# Patient Record
Sex: Female | Born: 1995 | Race: White | Hispanic: No | Marital: Single | Smoking: Former smoker
Health system: Southern US, Community
[De-identification: ages and names within clinical notes are randomized; demographics above are authoritative.]

## PROBLEM LIST (undated history)

## (undated) ENCOUNTER — Inpatient Hospital Stay: Payer: Self-pay

## (undated) DIAGNOSIS — F419 Anxiety disorder, unspecified: Secondary | ICD-10-CM

## (undated) DIAGNOSIS — Z789 Other specified health status: Secondary | ICD-10-CM

## (undated) DIAGNOSIS — F53 Postpartum depression: Secondary | ICD-10-CM

## (undated) DIAGNOSIS — M549 Dorsalgia, unspecified: Secondary | ICD-10-CM

## (undated) HISTORY — PX: NO PAST SURGERIES: SHX2092

## (undated) HISTORY — PX: WISDOM TOOTH EXTRACTION: SHX21

---
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
## 2014-05-28 ENCOUNTER — Encounter: Payer: Self-pay | Admitting: Emergency Medicine

## 2014-05-28 ENCOUNTER — Emergency Department
Admission: EM | Admit: 2014-05-28 | Discharge: 2014-05-28 | Disposition: A | Discharge status: Home / Self Care | Payer: 59 | Attending: Emergency Medicine | Admitting: Emergency Medicine

## 2014-05-28 DIAGNOSIS — Z720 Tobacco use: Secondary | ICD-10-CM | POA: Insufficient documentation

## 2014-05-28 DIAGNOSIS — Z792 Long term (current) use of antibiotics: Secondary | ICD-10-CM | POA: Diagnosis not present

## 2014-05-28 DIAGNOSIS — M549 Dorsalgia, unspecified: Secondary | ICD-10-CM | POA: Insufficient documentation

## 2014-05-28 DIAGNOSIS — M542 Cervicalgia: Secondary | ICD-10-CM | POA: Insufficient documentation

## 2014-05-28 MED ORDER — CYCLOBENZAPRINE HCL 10 MG PO TABS
10.0000 mg | ORAL_TABLET | Freq: Once | ORAL | Status: AC
  Administered 2014-05-28: 10 mg via ORAL

## 2014-05-28 MED ORDER — CYCLOBENZAPRINE HCL 10 MG PO TABS: 10.0000 mg | ORAL_TABLET | Freq: Three times a day (TID) | ORAL | Status: DC | PRN

## 2014-05-28 MED ORDER — TRAMADOL HCL 50 MG PO TABS
50.0000 mg | ORAL_TABLET | Freq: Once | ORAL | Status: AC
Start: 2014-05-28 — End: 2014-05-28
  Administered 2014-05-28: 50 mg via ORAL

## 2014-05-28 MED ORDER — TRAMADOL HCL 50 MG PO TABS
ORAL_TABLET | ORAL | Status: AC
  Administered 2014-05-28: 50 mg via ORAL
  Filled 2014-05-28: qty 1

## 2014-05-28 MED ORDER — CYCLOBENZAPRINE HCL 10 MG PO TABS
ORAL_TABLET | ORAL | Status: AC
  Administered 2014-05-28: 10 mg via ORAL
  Filled 2014-05-28: qty 1

## 2014-05-28 NOTE — ED Notes (Signed)
Pt in with co back pain x 1 week, states worse on movement and bending.  States does a lot of heavy lifting at work but no known injury.

## 2014-05-28 NOTE — ED Notes (Signed)
Patient with no complaints at this time. Respirations even and unlabored. Skin warm/dry. Discharge instructions reviewed with patient at this time. Patient given opportunity to voice concerns/ask questions. Patient discharged at this time and left Emergency Department with steady gait.   

## 2014-05-28 NOTE — ED Notes (Signed)
Pt is unsure of trauma/injury to area. Pt states she has tried OTC medications, without any relief. Pt states she has had these sx in the past, relief with time. Pt is alert and oriented x4. No physical deformities noted upon physical assessment. No bruising, bleeding, or swelling noted.
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
## 2014-05-28 NOTE — Discharge Instructions (Signed)
Back Pain, Adult °Low back pain is very common. About 1 in 5 people have back pain. The cause of low back pain is rarely dangerous. The pain often gets better over time. About half of people with a sudden onset of back pain feel better in just 2 weeks. About 8 in 10 people feel better by 6 weeks.  °CAUSES °Some common causes of back pain include: °· Strain of the muscles or ligaments supporting the spine. °· Wear and tear (degeneration) of the spinal discs. °· Arthritis. °· Direct injury to the back. °DIAGNOSIS °Most of the time, the direct cause of low back pain is not known. However, back pain can be treated effectively even when the exact cause of the pain is unknown. Answering your caregiver's questions about your overall health and symptoms is one of the most accurate ways to make sure the cause of your pain is not dangerous. If your caregiver needs more information, he or she may order lab work or imaging tests (X-rays or MRIs). However, even if imaging tests show changes in your back, this usually does not require surgery. °HOME CARE INSTRUCTIONS °For many people, back pain returns. Since low back pain is rarely dangerous, it is often a condition that people can learn to manage on their own.  °· Remain active. It is stressful on the back to sit or stand in one place. Do not sit, drive, or stand in one place for more than 30 minutes at a time. Take short walks on level surfaces as soon as pain allows. Try to increase the length of time you walk each day. °· Do not stay in bed. Resting more than 1 or 2 days can delay your recovery. °· Do not avoid exercise or work. Your body is made to move. It is not dangerous to be active, even though your back may hurt. Your back will likely heal faster if you return to being active before your pain is gone. °· Pay attention to your body when you  bend and lift. Many people have less discomfort when lifting if they bend their knees, keep the load close to their bodies, and  avoid twisting. Often, the most comfortable positions are those that put less stress on your recovering back. °· Find a comfortable position to sleep. Use a firm mattress and lie on your side with your knees slightly bent. If you lie on your back, put a pillow under your knees. °· Only take over-the-counter or prescription medicines as directed by your caregiver. Over-the-counter medicines to reduce pain and inflammation are often the most helpful. Your caregiver may prescribe muscle relaxant drugs. These medicines help dull your pain so you can more quickly return to your normal activities and healthy exercise. °· Put ice on the injured area. °· Put ice in a plastic bag. °· Place a towel between your skin and the bag. °· Leave the ice on for 15-20 minutes, 03-04 times a day for the first 2 to 3 days. After that, ice and heat may be alternated to reduce pain and spasms. °· Ask your caregiver about trying back exercises and gentle massage. This may be of some benefit. °· Avoid feeling anxious or stressed. Stress increases muscle tension and can worsen back pain. It is important to recognize when you are anxious or stressed and learn ways to manage it. Exercise is a great option. °SEEK MEDICAL CARE IF: °· You have pain that is not relieved with rest or medicine. °· You have pain that does not improve in 1 week. °· You have new symptoms. °· You are generally not feeling well. °SEEK   IMMEDIATE MEDICAL CARE IF:   You have pain that radiates from your back into your legs.  You develop new bowel or bladder control problems.  You have unusual weakness or numbness in your arms or legs.  You develop nausea or vomiting.  You develop abdominal pain.  You feel faint. Document Released: 12/28/2004 Document Revised: 06/29/2011 Document Reviewed: 05/01/2013 [facility] Patient Information 2015 [place], [place]. This information is not intended to replace advice given to you by your health care provider. Make sure you  discuss any questions you have with your health care provider.  Back Exercises Back exercises help treat and prevent back injuries. The goal of back exercises is to increase the strength of your abdominal and back muscles and the flexibility of your back. These exercises should be started when you no longer have back pain. Back exercises include:  Pelvic Tilt. Lie on your back with your knees bent. Tilt your pelvis until the lower part of your back is against the floor. Hold this position 5 to 10 sec and repeat 5 to 10 times.  Knee to Chest. Pull first 1 knee up against your chest and hold for 20 to 30 seconds, repeat this with the other knee, and then both knees. This may be done with the other leg straight or bent, whichever feels better.  Sit-Ups or Curl-Ups. Bend your knees 90 degrees. Start with tilting your pelvis, and do a partial, slow sit-up, lifting your trunk only 30 to 45 degrees off the floor. Take at least 2 to 3 seconds for each sit-up. Do not do sit-ups with your knees out straight. If partial sit-ups are difficult, simply do the above but with only tightening your abdominal muscles and holding it as directed.  Hip-Lift. Lie on your back with your knees flexed 90 degrees. Push down with your feet and shoulders as you raise your hips a couple inches off the floor; hold for 10 seconds, repeat 5 to 10 times.  Back arches. Lie on your stomach, propping yourself up on bent elbows. Slowly press on your hands, causing an arch in your low back. Repeat 3 to 5 times. Any initial stiffness and discomfort should lessen with repetition over time.  Shoulder-Lifts. Lie face down with arms beside your body. Keep hips and torso pressed to floor as you slowly lift your head and shoulders off the floor. Do not overdo your exercises, especially in the beginning. Exercises may cause you some mild back discomfort which lasts for a few minutes; however, if the pain is more severe, or lasts for more than 15  minutes, do not continue exercises until you see your caregiver. Improvement with exercise therapy for back problems is slow.  See your caregivers for assistance with developing a proper back exercise program. Document Released: 02/05/2004 Document Revised: 03/22/2011 Document Reviewed: 10/29/2010 [facility] Patient Information 2015 [place], [place]. This information is not intended to replace advice given to you by your health care provider. Make sure you discuss any questions you have with your health care provider.  Back Injury Prevention The following tips can help you to prevent a back injury. PHYSICAL FITNESS  Exercise often. Try to develop strong stomach (abdominal) muscles.  Do aerobic exercises often. This includes walking, jogging, biking, swimming.  Do exercises that help with balance and strength often. This includes tai chi and yoga.  Stretch before and after you exercise.  Keep a healthy weight. DIET   Ask your doctor how much calcium and vitamin D you need every  day.  Include calcium in your diet. Foods high in calcium include dairy products; green, leafy vegetables; and products with calcium added (fortified).  Include vitamin D in your diet. Foods high in vitamin D include milk and products with vitamin D added.  Think about taking a multivitamin or other nutritional products called " supplements."  Stop smoking if you smoke. POSTURE   Sit and stand up straight. Avoid leaning forward or hunching over.  Choose chairs that support your lower back.  If you work at a desk:  Sit close to your work so you do not lean over.  Keep your chin tucked in.  Keep your neck drawn back.  Keep your elbows bent at a right angle. Your arms should look like the letter "L."  Sit high and close to the steering wheel when you drive. Add low back support to your car seat if needed.  Avoid sitting or standing in one position for too long. Get up and move around every hour. Take  breaks if you are driving for a long time.  Sleep on your side with your knees slightly bent. You can also sleep on your back with a pillow under your knees. Do not sleep on your stomach. LIFTING, TWISTING, AND REACHING  Avoid heavy lifting, especially lifting over and over again. If you must do heavy lifting:  Stretch before lifting.  Work slowly.  Rest between lifts.  Use carts and dollies to move objects when possible.  Make several small trips instead of carrying 1 heavy load.  Ask for help when you need it.  Ask for help when moving big, awkward objects.  Follow these steps when lifting:  Stand with your feet shoulder-width apart.  Get as close to the object as you can. Do not pick up heavy objects that are far from your body.  Use handles or lifting straps when possible.  Bend at your knees. Squat down, but keep your heels off the floor.  Keep your shoulders back, your chin tucked in, and your back straight.  Lift the object slowly. Tighten the muscles in your legs, stomach, and butt. Keep the object as close to the center of your body as possible.  Reverse these directions when you put a load down.  Do not:  Lift the object above your waist.  Twist at the waist while lifting or carrying a load. Move your feet if you need to turn, not your waist.  Bend over without bending at your knees.  Avoid reaching over your head, across a table, or for an object on a high surface. OTHER TIPS  Avoid wet floors and keep sidewalks clear of ice.  Do not sleep on a mattress that is too soft or too hard.  Keep items that you use often within easy reach.  Put heavier objects on shelves at waist level. Put lighter objects on lower or higher shelves.  Find ways to lessen your stress. You can try exercise, massage, or relaxation.  Get help for depression or anxiety if needed. GET HELP IF:  You injure your back.  You have questions about diet, exercise, or other ways to  prevent back injuries. MAKE SURE YOU:  Understand these instructions.  Will watch your condition.  Will get help right away if you are not doing well or get worse. Document Released: 06/16/2007 Document Revised: 03/22/2011 Document Reviewed: 02/08/2011 [facility] Patient Information 2015 [place], [place]. This information is not intended to replace advice given to you by your health  care provider. Make sure you discuss any questions you have with your health care provider.

## 2014-06-26 ENCOUNTER — Encounter: Payer: Self-pay | Admitting: Primary Care

## 2014-06-26 ENCOUNTER — Ambulatory Visit (INDEPENDENT_AMBULATORY_CARE_PROVIDER_SITE_OTHER): Payer: 59 | Admitting: Primary Care

## 2014-06-26 VITALS — BP 108/68 | HR 72 | Temp 98.2°F | Ht 65.5 in | Wt 188.0 lb

## 2014-06-26 DIAGNOSIS — B86 Scabies: Secondary | ICD-10-CM | POA: Diagnosis not present

## 2014-06-26 MED ORDER — PERMETHRIN 5 % EX CREA
TOPICAL_CREAM | CUTANEOUS | Status: DC
Start: 2014-06-26 — End: 2014-07-02

## 2014-06-26 NOTE — Patient Instructions (Addendum)
Apply Permethrin cream starting at your head down to soles of your feet. Do not apply to your mouth, eyes, ears, genitals, rectum. Leave this cream on for 8-10 hours then rinse off.  Please let me know if your rash does not improve after the treatment. It was nice meeting you!  Scabies Scabies are small bugs (mites) that burrow under the skin and cause red bumps and severe itching. These bugs can only be seen with a microscope. Scabies are highly contagious. They can spread easily from person to person by direct contact. They are also spread through sharing clothing or linens that have the scabies mites living in them. It is not unusual for an entire family to become infected through shared towels, clothing, or bedding.  HOME CARE INSTRUCTIONS   Your caregiver may prescribe a cream or lotion to kill the mites. If cream is prescribed, massage the cream into the entire body from the neck to the bottom of both feet. Also massage the cream into the scalp and face if your child is less than 52 year old. Avoid the eyes and mouth. Do not wash your hands after application.  Leave the cream on for 8 to 12 hours. Your child should bathe or shower after the 8 to 12 hour application period. Sometimes it is helpful to apply the cream to your child right before bedtime.  One treatment is usually effective and will eliminate approximately 95% of infestations. For severe cases, your caregiver may decide to repeat the treatment in 1 week. Everyone in your household should be treated with one application of the cream.  New rashes or burrows should not appear within 24 to 48 hours after successful treatment. However, the itching and rash may last for 2 to 4 weeks after successful treatment. Your caregiver may prescribe a medicine to help with the itching or to help the rash go away more quickly.  Scabies can live on clothing or linens for up to 3 days. All of your child's recently used clothing, towels, stuffed toys,  and bed linens should be washed in hot water and then dried in a dryer for at least 20 minutes on high heat. Items that cannot be washed should be enclosed in a plastic bag for at least 3 days.  To help relieve itching, bathe your child in a cool bath or apply cool washcloths to the affected areas.  Your child may return to school after treatment with the prescribed cream. SEEK MEDICAL CARE IF:   The itching persists longer than 4 weeks after treatment.  The rash spreads or becomes infected. Signs of infection include red blisters or yellow-tan crust. Document Released: 12/28/2004 Document Revised: 03/22/2011 Document Reviewed: 05/08/2008 [facility] Patient Information 2015 [place], [place]. This information is not intended to replace advice given to you by your health care provider. Make sure you discuss any questions you have with your health care provider.

## 2014-06-26 NOTE — Progress Notes (Signed)
Pre visit review using our clinic review tool, if applicable. No additional management support is needed unless otherwise documented below in the visit note. 

## 2014-06-26 NOTE — Progress Notes (Signed)
   Subjective:    Patient ID: Amy Cabrera, female    DOB: Sep 29, 1995, 19 y.o.   MRN: [id number]  HPI  Ms. Cuppett is a 19 year old female who presents today with a chief complaint of rash. Her rash is present to both hands on the palmer, dorsal and lateral aspect of her hands with a few spots to her fingers. The rash has been present since last Friday and is itchy in nature. She's taken some Melatonin to help her sleep due to constant itching. Her family was treated for scabies several years ago and she reports this feels similar.  Review of Systems  Constitutional: Negative for fever and chills.  Respiratory: Negative for shortness of breath.   Cardiovascular: Negative for chest pain.  Skin: Positive for rash.       No past medical history on file.  History   Social History  . Marital Status: Single    Spouse Name: N/A  . Number of Children: N/A  . Years of Education: N/A   Occupational History  . Not on file.   Social History Main Topics  . Smoking status: Current Every Day Smoker  . Smokeless tobacco: Not on file  . Alcohol Use: 0.0 oz/week    0 Standard drinks or equivalent per week  . Drug Use: No  . Sexual Activity: Not on file   Other Topics Concern  . Not on file   Social History Narrative    No past surgical history on file.  Family History  Problem Relation Age of Onset  . Cancer Maternal Grandmother     breast CA  . Heart disease Maternal Grandfather     CAD  . Cancer Maternal Grandfather     colon CA  . Diabetes Maternal Grandfather     No Known Allergies  Current Outpatient Prescriptions on File Prior to Visit  Medication Sig Dispense Refill  . cyclobenzaprine (FLEXERIL) 10 MG tablet Take 1 tablet (10 mg total) by mouth every 8 (eight) hours as needed for muscle spasms. 15 tablet 0   No current facility-administered medications on file prior to visit.    BP 108/68 mmHg  Pulse 72  Temp(Src) 98.2 F (36.8 C) (Oral)  Ht 5' 5.5" (1.664  m)  Wt 188 lb (85.276 kg)  BMI 30.80 kg/m2  SpO2 98%  LMP 05/14/2014    Objective:   Physical Exam  Cardiovascular: Normal rate and regular rhythm.   Pulmonary/Chest: Effort normal and breath sounds normal.  Skin: Skin is warm and dry.  Rash present to palmar, dorsal, and lateral aspect of bilateral hands suggestive of scabies. No crusting. No obvious burrowing.          Assessment & Plan:

## 2014-06-26 NOTE — Assessment & Plan Note (Signed)
Classic presentation to bilateral hands. Moderate amount of itching. No obvious burrowing, no crusting. RX for Permethrin to apply once. Instructions provided. Discussed to wash bed sheets, clothes, etc. Follow up if no improvement.

## 2014-06-28 ENCOUNTER — Ambulatory Visit: Payer: 59 | Admitting: Family Medicine

## 2014-07-02 ENCOUNTER — Ambulatory Visit (INDEPENDENT_AMBULATORY_CARE_PROVIDER_SITE_OTHER): Payer: 59 | Admitting: Family Medicine

## 2014-07-02 ENCOUNTER — Encounter: Payer: Self-pay | Admitting: Family Medicine

## 2014-07-02 ENCOUNTER — Ambulatory Visit (INDEPENDENT_AMBULATORY_CARE_PROVIDER_SITE_OTHER)
Admission: RE | Admit: 2014-07-02 | Discharge: 2014-07-02 | Disposition: A | Discharge status: Home / Self Care | Payer: 59 | Source: Ambulatory Visit | Attending: Family Medicine | Admitting: Family Medicine

## 2014-07-02 VITALS — BP 116/62 | HR 76 | Temp 98.3°F | Ht 65.5 in | Wt 192.5 lb

## 2014-07-02 DIAGNOSIS — M545 Low back pain, unspecified: Secondary | ICD-10-CM

## 2014-07-02 DIAGNOSIS — J069 Acute upper respiratory infection, unspecified: Secondary | ICD-10-CM

## 2014-07-02 DIAGNOSIS — M542 Cervicalgia: Secondary | ICD-10-CM

## 2014-07-02 DIAGNOSIS — L309 Dermatitis, unspecified: Secondary | ICD-10-CM | POA: Diagnosis not present

## 2014-07-02 DIAGNOSIS — B9789 Other viral agents as the cause of diseases classified elsewhere: Secondary | ICD-10-CM

## 2014-07-02 MED ORDER — TRIAMCINOLONE ACETONIDE 0.5 % EX CREA: 1.0000 "application " | TOPICAL_CREAM | Freq: Two times a day (BID) | CUTANEOUS | Status: DC

## 2014-07-02 MED ORDER — DICLOFENAC SODIUM 75 MG PO TBEC: 75.0000 mg | DELAYED_RELEASE_TABLET | Freq: Two times a day (BID) | ORAL | Status: DC

## 2014-07-02 NOTE — Assessment & Plan Note (Signed)
Quit smoking. Symptomatic care.

## 2014-07-02 NOTE — Progress Notes (Signed)
Pre visit review using our clinic review tool, if applicable. No additional management support is needed unless otherwise documented below in the visit note. 
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
## 2014-07-02 NOTE — Patient Instructions (Addendum)
We will call with X-ray results. Start diclofenac for pain. Start back and neck strengthening exercise. Mucinex DM for cough and congestion.  Trial of zyrtec at bedtime for allegies.  Rest.  Apply steroid cream to areas of rash on hands.  Call if not improving in 2 weeks.
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
## 2014-08-26 ENCOUNTER — Emergency Department
Admission: EM | Admit: 2014-08-26 | Discharge: 2014-08-26 | Disposition: A | Discharge status: Home / Self Care | Payer: 59 | Attending: Emergency Medicine | Admitting: Emergency Medicine

## 2014-08-26 DIAGNOSIS — J029 Acute pharyngitis, unspecified: Secondary | ICD-10-CM | POA: Diagnosis not present

## 2014-08-26 DIAGNOSIS — Z791 Long term (current) use of non-steroidal anti-inflammatories (NSAID): Secondary | ICD-10-CM | POA: Insufficient documentation

## 2014-08-26 DIAGNOSIS — Z720 Tobacco use: Secondary | ICD-10-CM | POA: Diagnosis not present

## 2014-08-26 DIAGNOSIS — Z79899 Other long term (current) drug therapy: Secondary | ICD-10-CM | POA: Insufficient documentation

## 2014-08-26 LAB — POCT RAPID STREP A: STREPTOCOCCUS, GROUP A SCREEN (DIRECT): NEGATIVE

## 2014-08-26 NOTE — ED Notes (Signed)
Pt in with co sore throat since Thursday, also co bilat rib pain.  Denies any rib injury, pain worse when she takes a deep breath.

## 2014-08-26 NOTE — ED Provider Notes (Signed)
[facility] Emergency Department Provider Note ____________________________________________  Time seen: 2000  I have reviewed the triage vital signs and the nursing notes.  HISTORY  Chief Complaint  Sore Throat  HPI Amy Cabrera is a 19 y.o. female reports to the ED with c/o sore throat since Thursday. She denies fevers, chills, sweats, nauseaor vomiting. She has dosed ibuprofen, occasionally. She rates her pain at 3/10 during the exam.   No past medical history on file.  Patient Active Problem List   Diagnosis Date Noted  . Acute neck pain 07/02/2014  . Low back pain 07/02/2014  . Hand dermatitis 07/02/2014  . Viral URI with cough 07/02/2014  . Scabies 06/26/2014  . 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  Current Outpatient Rx  Name  Route  Sig  Dispense  Refill  . cyclobenzaprine (FLEXERIL) 10 MG tablet   Oral   Take 1 tablet (10 mg total) by mouth every 8 (eight) hours as needed for muscle spasms.   15 tablet   0   . diclofenac (VOLTAREN) 75 MG EC tablet   Oral   Take 1 tablet (75 mg total) by mouth 2 (two) times daily.   30 tablet   0   . triamcinolone cream (KENALOG) 0.5 %   Topical   Apply 1 application topically 2 (two) times daily.   30 g   0    Allergies Review of patient's allergies indicates no known allergies.  Family History  Problem Relation Age of Onset  . Cancer Maternal Grandmother     breast CA  . Heart disease Maternal Grandfather     CAD  . Cancer Maternal Grandfather     colon CA  . Diabetes Maternal Grandfather    Social History Social History  Substance Use Topics  . Smoking status: Current Every Day Smoker -- 0.25 packs/day    Types: Cigarettes  . Smokeless tobacco: Not on file  . Alcohol Use: No   Review of Systems  Constitutional: Negative for fever. Eyes: Negative for visual changes. ENT: Positive for sore throat. Cardiovascular: Negative for chest pain. Respiratory:  Negative for shortness of breath. Gastrointestinal: Negative for abdominal pain, vomiting and diarrhea. Genitourinary: Negative for dysuria. Musculoskeletal: Negative for back pain. Skin: Negative for rash. Neurological: Negative for headaches, focal weakness or numbness. ____________________________________________  PHYSICAL EXAM:  VITAL SIGNS: ED Triage Vitals  Enc Vitals Group     BP 08/26/14 1912 138/74 mmHg     Pulse Rate 08/26/14 1912 84     Resp 08/26/14 1912 18     Temp 08/26/14 1912 99.4 F (37.4 C)     Temp Source 08/26/14 1912 Oral     SpO2 08/26/14 1912 100 %     Weight 08/26/14 1912 180 lb (81.647 kg)     Height 08/26/14 1912  (1.651 m)     Head Cir --      Peak Flow --      Pain Score 08/26/14 1913 3     Pain Loc --      Pain Edu? --      Excl. in GC? --    Constitutional: Alert and oriented. Well appearing and in no distress. Eyes: Conjunctivae are normal. PERRL. Normal extraocular movements. ENT   Head: Normocephalic and atraumatic.   Nose: No congestion/rhinnorhea.   Mouth/Throat: Mucous membranes are moist. Uvula is midline. Tonsils are not enlarged and no exudate is appreciated.    Neck: Supple.  No thyromegaly. Hematological/Lymphatic/Immunilogical: No cervical lymphadenopathy. Cardiovascular: Normal rate, regular rhythm.  Respiratory: Normal respiratory effort. No wheezes/rales/rhonchi. Gastrointestinal: Soft and nontender. No distention. Musculoskeletal: Nontender with normal range of motion in all extremities.  Neurologic:  Normal gait without ataxia. Normal speech and language. No gross focal neurologic deficits are appreciated. Skin:  Skin is warm, dry and intact. No rash noted. Psychiatric: Mood and affect are normal. Patient exhibits appropriate insight and judgment. ____________________________________________  LABS  Labs Reviewed  CULTURE, GROUP A STREP ([facility] ONLY)  POCT RAPID STREP A   ____________________________________________  INITIAL IMPRESSION / ASSESSMENT AND PLAN / ED COURSE  Sore throat pain without evidence of infectious etiology. Suggest salt-water gargles. Will await culture results. Work note provided for today as requested.  ____________________________________________  FINAL CLINICAL IMPRESSION(S) / ED DIAGNOSES  Final diagnoses:  Sore throat     Lissa Hoard, PA-C 08/26/14 2027  Maurilio Lovely, MD 08/27/14 [id number]

## 2014-08-26 NOTE — Discharge Instructions (Signed)
Sore Throat A sore throat is pain, burning, irritation, or scratchiness of the throat. There is often pain or tenderness when swallowing or talking. A sore throat may be accompanied by other symptoms, such as coughing, sneezing, fever, and swollen neck glands. A sore throat is often the first sign of another sickness, such as a cold, flu, strep throat, or mononucleosis (commonly known as mono). Most sore throats go away without medical treatment. CAUSES  The most common causes of a sore throat include:  A viral infection, such as a cold, flu, or mono.  A bacterial infection, such as strep throat, tonsillitis, or whooping cough.  Seasonal allergies.  Dryness in the air.  Irritants, such as smoke or pollution.  Gastroesophageal reflux disease (GERD). HOME CARE INSTRUCTIONS   Only take over-the-counter medicines as directed by your caregiver.  Drink enough fluids to keep your urine clear or pale yellow.  Rest as needed.  Try using throat sprays, lozenges, or sucking on hard candy to ease any pain (if older than 4 years or as directed).  Sip warm liquids, such as broth, herbal tea, or warm water with honey to relieve pain temporarily. You may also eat or drink cold or frozen liquids such as frozen ice pops.  Gargle with salt water (mix 1 tsp salt with 8 oz of water).  Do not smoke and avoid secondhand smoke.  Put a cool-mist humidifier in your bedroom at night to moisten the air. You can also turn on a hot shower and sit in the bathroom with the door closed for 5-10 minutes. SEEK IMMEDIATE MEDICAL CARE IF:  You have difficulty breathing.  You are unable to swallow fluids, soft foods, or your saliva.  You have increased swelling in the throat.  Your sore throat does not get better in 7 days.  You have nausea and vomiting.  You have a fever or persistent symptoms for more than 2-3 days.  You have a fever and your symptoms suddenly get worse. MAKE SURE YOU:   Understand  these instructions.  Will watch your condition.  Will get help right away if you are not doing well or get worse. Document Released: 02/05/2004 Document Revised: 12/15/2011 Document Reviewed: 09/05/2011 [facility] Patient Information 2015 [place], [place]. This information is not intended to replace advice given to you by your health care provider. Make sure you discuss any questions you have with your health care provider.  Continue to monitor symptoms. Gargle warm salty water. Follow-up with your provider or return as needed.

## 2014-08-28 LAB — CULTURE, GROUP A STREP (THRC)

## 2014-09-19 ENCOUNTER — Telehealth: Payer: Self-pay | Admitting: Family Medicine

## 2014-09-19 ENCOUNTER — Ambulatory Visit: Payer: 59 | Admitting: Family Medicine

## 2014-09-19 NOTE — Telephone Encounter (Signed)
Pt did not come in for their appt today for acute visit. Please let me know if pt needs to be contacted immediately for follow up or no follow up needed. Best phone number to contact pt is [phone].

## 2014-09-20 NOTE — Telephone Encounter (Signed)
Cal l pt to reschedule if still needed per pt.

## 2014-09-23 NOTE — Telephone Encounter (Signed)
Called pt, no answer/machine to leave a message on either phone numbers listed.

## 2014-09-26 ENCOUNTER — Encounter: Payer: Self-pay | Admitting: Family Medicine

## 2014-10-01 ENCOUNTER — Ambulatory Visit (INDEPENDENT_AMBULATORY_CARE_PROVIDER_SITE_OTHER): Payer: 59 | Admitting: Family Medicine

## 2014-10-01 ENCOUNTER — Encounter: Payer: Self-pay | Admitting: *Deleted

## 2014-10-01 ENCOUNTER — Encounter: Payer: Self-pay | Admitting: Family Medicine

## 2014-10-01 VITALS — BP 108/68 | HR 108 | Temp 98.8°F | Ht 65.5 in | Wt 190.5 lb

## 2014-10-01 DIAGNOSIS — M545 Low back pain, unspecified: Secondary | ICD-10-CM

## 2014-10-01 DIAGNOSIS — A084 Viral intestinal infection, unspecified: Secondary | ICD-10-CM | POA: Insufficient documentation

## 2014-10-01 DIAGNOSIS — J029 Acute pharyngitis, unspecified: Secondary | ICD-10-CM | POA: Insufficient documentation

## 2014-10-01 DIAGNOSIS — J309 Allergic rhinitis, unspecified: Secondary | ICD-10-CM | POA: Diagnosis not present

## 2014-10-01 MED ORDER — ONDANSETRON HCL 8 MG PO TABS: 8.0000 mg | ORAL_TABLET | Freq: Three times a day (TID) | ORAL | Status: DC | PRN

## 2014-10-01 NOTE — Assessment & Plan Note (Signed)
No suggestion on exam of strep. Likely due to recent multiple episodes of emesis.

## 2014-10-01 NOTE — Progress Notes (Signed)
Subjective:    Patient ID: Amy Cabrera, female    DOB: 09-13-1995, 19 y.o.   MRN: [id number]  HPI  19 year old female presents with the following complaints:    1.  Mid back pain: Seen for this last in  07/02/2014.   X-ray: Nml   Diclofenac and flexeril help a little initially but not any longer. HAs not been using in last 2 weeks.  Worsening pain in central low back.  No radiation of pain.  No numbness, no weakness.   2. Abdominal pain: new in last 24 hours. Woke in middle of night 4 AM with sharp pain in right middle abdomen.   Ate half possibly bad sausage biscuit yesterday.  Had episode of emesis x 3. Has not kept anything down this AM. She has been around her nephew, little sister who is sick, GI illness.   3. Sore throat: began in last few MINUTES! Mild. Strep exposure at work.  No fever. Some cough and congestion in last few days.  4. Always has nasal congestion.. Worried about allergies  She has tried claritin D, mucinex, zyrtec. Has not tried  Nasal spray or allegra. She refuses nasal spray " I don't like those"    Review of Systems  Constitutional: Positive for fatigue. Negative for chills.  HENT: Negative for ear pain.   Eyes: Negative for pain.  Respiratory: Negative for shortness of breath and wheezing.   Cardiovascular: Negative for chest pain.       Objective:   Physical Exam  Constitutional: Vital signs are normal. She appears well-developed and well-nourished. She is cooperative.  Non-toxic appearance. She does not have a sickly appearance. She appears ill. No distress.  HENT:  Head: Normocephalic.  Right Ear: Hearing, tympanic membrane, external ear and ear canal normal. Tympanic membrane is not erythematous, not retracted and not bulging.  Left Ear: Hearing, tympanic membrane, external ear and ear canal normal. Tympanic membrane is not erythematous, not retracted and not bulging.  Nose: No mucosal edema or rhinorrhea. Right sinus exhibits no  maxillary sinus tenderness and no frontal sinus tenderness. Left sinus exhibits no maxillary sinus tenderness and no frontal sinus tenderness.  Mouth/Throat: Uvula is midline, oropharynx is clear and moist and mucous membranes are normal.  Eyes: Conjunctivae, EOM and lids are normal. Pupils are equal, round, and reactive to light. Lids are everted and swept, no foreign bodies found.  Neck: Trachea normal and normal range of motion. Neck supple. Carotid bruit is not present. No thyroid mass and no thyromegaly present.  Cardiovascular: Normal rate, regular rhythm, S1 normal, S2 normal, normal heart sounds, intact distal pulses and normal pulses.  Exam reveals no gallop and no friction rub.   No murmur heard. Pulmonary/Chest: Effort normal and breath sounds normal. No tachypnea. No respiratory distress. She has no decreased breath sounds. She has no wheezes. She has no rhonchi. She has no rales.  Abdominal: Soft. Normal appearance and bowel sounds are normal. There is no hepatosplenomegaly. There is tenderness in the right lower quadrant and left lower quadrant. There is no rigidity, no rebound, no guarding and no CVA tenderness.  Musculoskeletal:       Cervical back: She exhibits normal range of motion, no tenderness and no bony tenderness.       Thoracic back: She exhibits tenderness. She exhibits normal range of motion and no bony tenderness.       Lumbar back: Normal. She exhibits normal range of motion, no tenderness and no  bony tenderness.  Pt points to pain across low back but is not tender to palpation.  Neurological: She is alert.  Skin: Skin is warm, dry and intact. No rash noted.  Psychiatric: Her speech is normal and behavior is normal. Judgment and thought content normal. Her mood appears not anxious. Cognition and memory are normal. She does not exhibit a depressed mood.          Assessment & Plan:

## 2014-10-01 NOTE — Assessment & Plan Note (Signed)
Refer to PT. Dicusssed healthy back care.

## 2014-10-01 NOTE — Progress Notes (Signed)
Pre visit review using our clinic review tool, if applicable. No additional management support is needed unless otherwise documented below in the visit note. 

## 2014-10-01 NOTE — Patient Instructions (Addendum)
Can use zofran as needed for nausea and vomiting.  Slowly advance fluids, hold food for now. Call in 8 hours if not able to keep any liquids down.   Expect diarrhea to begin and symptoms last 4-5 days.  Call if not improving as expected.  When GI illness feeling better.. Start trial of allegra. Take daily x 1 month at least.. Call if not improving.  Stop at front desk to set up referral to PT.

## 2014-10-01 NOTE — Assessment & Plan Note (Signed)
Symtpomatic care, zofran for nausea. Advance liquids as tolerated.

## 2014-10-01 NOTE — Assessment & Plan Note (Signed)
Trial of allegra. Pt refuses nasal steroid. If fails allegra trail will consider singulair.

## 2014-10-07 ENCOUNTER — Encounter (HOSPITAL_COMMUNITY): Payer: Self-pay | Admitting: Emergency Medicine

## 2014-10-07 ENCOUNTER — Emergency Department (HOSPITAL_COMMUNITY)
Admission: EM | Admit: 2014-10-07 | Discharge: 2014-10-07 | Disposition: A | Discharge status: Home / Self Care | Payer: 59 | Attending: Emergency Medicine | Admitting: Emergency Medicine

## 2014-10-07 DIAGNOSIS — Z720 Tobacco use: Secondary | ICD-10-CM | POA: Diagnosis not present

## 2014-10-07 DIAGNOSIS — M545 Low back pain, unspecified: Secondary | ICD-10-CM

## 2014-10-07 DIAGNOSIS — Z87828 Personal history of other (healed) physical injury and trauma: Secondary | ICD-10-CM | POA: Diagnosis not present

## 2014-10-07 DIAGNOSIS — G8929 Other chronic pain: Secondary | ICD-10-CM | POA: Diagnosis not present

## 2014-10-07 MED ORDER — CYCLOBENZAPRINE HCL 10 MG PO TABS: 10.0000 mg | ORAL_TABLET | Freq: Two times a day (BID) | ORAL | Status: DC | PRN

## 2014-10-07 MED ORDER — NAPROXEN 500 MG PO TABS: 500.0000 mg | ORAL_TABLET | Freq: Two times a day (BID) | ORAL | Status: DC
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
## 2014-10-07 NOTE — ED Notes (Signed)
Pt. reports chronic low back pain for 2 years , denies recent fall or injury , no dysuria or hematuria / ambulatory .

## 2014-10-07 NOTE — ED Notes (Signed)
Unable to locate pt. at triage and waiting area several times by staff.  

## 2014-10-07 NOTE — ED Notes (Signed)
Patient left at this time with all belongings. 

## 2014-10-23 ENCOUNTER — Ambulatory Visit: Payer: 59 | Admitting: Physical Therapy

## 2014-12-27 ENCOUNTER — Encounter: Payer: Self-pay | Admitting: Internal Medicine

## 2014-12-27 ENCOUNTER — Ambulatory Visit (INDEPENDENT_AMBULATORY_CARE_PROVIDER_SITE_OTHER): Payer: 59 | Admitting: Internal Medicine

## 2014-12-27 VITALS — BP 120/74 | HR 93 | Temp 98.5°F | Wt 196.5 lb

## 2014-12-27 DIAGNOSIS — J0100 Acute maxillary sinusitis, unspecified: Secondary | ICD-10-CM | POA: Diagnosis not present

## 2014-12-27 MED ORDER — AMOXICILLIN 500 MG PO CAPS: 500.0000 mg | ORAL_CAPSULE | Freq: Three times a day (TID) | ORAL | Status: DC

## 2014-12-27 NOTE — Progress Notes (Signed)
Pre visit review using our clinic review tool, if applicable. No additional management support is needed unless otherwise documented below in the visit note. 

## 2014-12-27 NOTE — Progress Notes (Signed)
HPI  Pt presents to the clinic today with c/o headache, facial pain and pressure, runny nose, nasal congestion and ear pain. This started 1 week ago. She is blowing green mucous out of her nose. She denies fever but has had chills and body aches. She has tried Catering managerAlka Seltzer cold and flu without any relief. She has no history of seasonal allergies or breathing problems. She has not had sick contacts that she is aware of. She does smoke.  Review of Systems   No past medical history on file.  Family History  Problem Relation Age of Onset  . Cancer Maternal Grandmother     breast CA  . Heart disease Maternal Grandfather     CAD  . Cancer Maternal Grandfather     colon CA  . Diabetes Maternal Grandfather     Social History   Social History  . Marital Status: Single    Spouse Name: N/A  . Number of Children: N/A  . Years of Education: N/A   Occupational History  . Not on file.   Social History Main Topics  . Smoking status: Current Every Day Smoker -- 0.25 packs/day for 0 years    Types: Cigarettes  . Smokeless tobacco: Never Used  . Alcohol Use: No  . Drug Use: No  . Sexual Activity: Not on file   Other Topics Concern  . Not on file   Social History Narrative    No Known Allergies   Constitutional: Positive headache, fatigue. Denies fever or abrupt weight changes.  HEENT:  Positive facial pain, nasal congestion and sore throat. Denies eye redness, ear pain, ringing in the ears, wax buildup, runny nose or bloody nose. Respiratory: Positive cough. Denies difficulty breathing or shortness of breath.  Cardiovascular: Denies chest pain, chest tightness, palpitations or swelling in the hands or feet.   No other specific complaints in a complete review of systems (except as listed in HPI above).  Objective:  BP 120/74 mmHg  Pulse 93  Temp(Src) 98.5 F (36.9 C) (Oral)  Wt 196 lb 8 oz (89.132 kg)  SpO2 98%  LMP 12/19/2014   General: Appears her stated age, well  developed, well nourished in NAD. HEENT: Head: normal shape and size, left maxillary sinus tenderness noted; Eyes: sclera white, no icterus, conjunctiva pink; Ears: Tm's pink but intact, normal light reflex, + serous effusions bilaterally; Nose: mucosa boggy and moist, septum midline; Throat/Mouth: + PND. Teeth present, mucosa erythematous and moist, no exudate noted, no lesions or ulcerations noted.  Neck:  No adenopathy noted  Cardiovascular: Normal rate and rhythm. S1,S2 noted.  No murmur, rubs or gallops noted.  Pulmonary/Chest: Normal effort and positive vesicular breath sounds. No respiratory distress. No wheezes, rales or ronchi noted.      Assessment & Plan:   Acute bacterial sinusitis  Can use a Neti Pot which can be purchased from your local drug store. Flonase 2 sprays each nostril for 3 days and then as needed. Amoxil TID for 10 days Stop smoking Work note provided  RTC as needed or if symptoms persist.

## 2014-12-27 NOTE — Patient Instructions (Signed)

## 2015-01-01 ENCOUNTER — Encounter: Payer: Self-pay | Admitting: Family Medicine

## 2015-01-01 ENCOUNTER — Ambulatory Visit (INDEPENDENT_AMBULATORY_CARE_PROVIDER_SITE_OTHER): Payer: 59 | Admitting: Family Medicine

## 2015-01-01 VITALS — BP 100/80 | HR 97 | Temp 98.6°F | Ht 65.5 in | Wt 196.8 lb

## 2015-01-01 DIAGNOSIS — M542 Cervicalgia: Secondary | ICD-10-CM

## 2015-01-01 DIAGNOSIS — M545 Low back pain, unspecified: Secondary | ICD-10-CM

## 2015-01-01 MED ORDER — CYCLOBENZAPRINE HCL 10 MG PO TABS: 10.0000 mg | ORAL_TABLET | Freq: Every day | ORAL | Status: DC

## 2015-01-01 MED ORDER — PREDNISONE 20 MG PO TABS: ORAL_TABLET | ORAL | Status: DC

## 2015-01-01 MED ORDER — HYDROCODONE-ACETAMINOPHEN 5-325 MG PO TABS: 1.0000 | ORAL_TABLET | Freq: Four times a day (QID) | ORAL | Status: DC | PRN
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
## 2015-01-01 NOTE — Progress Notes (Signed)
Pre visit review using our clinic review tool, if applicable. No additional management support is needed unless otherwise documented below in the visit note. 

## 2015-02-26 ENCOUNTER — Ambulatory Visit: Payer: 59 | Admitting: Primary Care

## 2015-02-27 ENCOUNTER — Encounter: Payer: Self-pay | Admitting: Primary Care

## 2015-02-27 ENCOUNTER — Ambulatory Visit (INDEPENDENT_AMBULATORY_CARE_PROVIDER_SITE_OTHER): Payer: 59 | Admitting: Primary Care

## 2015-02-27 VITALS — BP 114/64 | HR 106 | Temp 98.4°F | Ht 65.5 in | Wt 199.0 lb

## 2015-02-27 DIAGNOSIS — R05 Cough: Secondary | ICD-10-CM

## 2015-02-27 DIAGNOSIS — R059 Cough, unspecified: Secondary | ICD-10-CM

## 2015-02-27 MED ORDER — AMOXICILLIN 500 MG PO CAPS: 500.0000 mg | ORAL_CAPSULE | Freq: Two times a day (BID) | ORAL | Status: DC

## 2015-02-27 NOTE — Progress Notes (Signed)
   Subjective:    Patient ID: Amy Cabrera, female    DOB: 1995-07-14, 20 y.o.   MRN: [id number]  HPI  Amy Cabrera is a 21 year old female who presents today with a chief complaint of cough. She also reports headache, chest congestion, nasal congestion. Her symptoms began 1 week ago with worsening symptoms yesterday. Her cough is productive with green sputum. She's taken mucinex OTC for her symptoms without improvement. Denies fevers, chills. She's been around her friend's children who have been sick.   Review of Systems  Constitutional: Positive for fever and chills.  HENT: Positive for congestion and sore throat. Negative for sinus pressure.   Respiratory: Positive for cough. Negative for shortness of breath.   Cardiovascular: Negative for chest pain.       No past medical history on file.  Social History   Social History  . Marital Status: Single    Spouse Name: N/A  . Number of Children: N/A  . Years of Education: N/A   Occupational History  . Not on file.   Social History Main Topics  . Smoking status: Current Every Day Smoker -- 0.25 packs/day for 0 years    Types: Cigarettes  . Smokeless tobacco: Never Used  . Alcohol Use: No  . Drug Use: No  . Sexual Activity: Not on file   Other Topics Concern  . Not on file   Social History Narrative    No past surgical history on file.  Family History  Problem Relation Age of Onset  . Cancer Maternal Grandmother     breast CA  . Heart disease Maternal Grandfather     CAD  . Cancer Maternal Grandfather     colon CA  . Diabetes Maternal Grandfather     No Known Allergies  Current Outpatient Prescriptions on File Prior to Visit  Medication Sig Dispense Refill  . naproxen (NAPROSYN) 500 MG tablet Take 1 tablet (500 mg total) by mouth 2 (two) times daily. (Patient not taking: Reported on 02/27/2015) 30 tablet 0   No current facility-administered medications on file prior to visit.    BP 114/64 mmHg  Pulse 106   Temp(Src) 98.4 F (36.9 C) (Oral)  Ht 5' 5.5" (1.664 m)  Wt 199 lb (90.266 kg)  BMI 32.60 kg/m2  SpO2 98%  LMP 01/24/2015    Objective:   Physical Exam  Constitutional: She appears well-nourished.  HENT:  Right Ear: Tympanic membrane and ear canal normal.  Left Ear: Tympanic membrane and ear canal normal.  Nose: Mucosal edema present. Right sinus exhibits maxillary sinus tenderness. Right sinus exhibits no frontal sinus tenderness. Left sinus exhibits maxillary sinus tenderness. Left sinus exhibits no frontal sinus tenderness.  Mouth/Throat: Oropharynx is clear and moist.  Neck: Neck supple.  Cardiovascular: Normal rate and regular rhythm.   Pulmonary/Chest: She has no wheezes. She has rales.  Skin: Skin is warm.  Slightly diaphoretic          Assessment & Plan:  URI:  Cough, chest and nasal congestion, fatigue, fever x 1 week. Now worse with production of green sputum. Exam with rales throughout, + smoker. Given presentation and duration, will treat for possible bacterial involvement. RX for Amoxil course provided. Start Mucinex, Flonase. Fluids, rest, return precautions provided.

## 2015-02-27 NOTE — Patient Instructions (Signed)
Start amoxicillin antibiotic. Take 1 tablet by mouth twice daily for 7 days.  You may also try Robitussin cough syrup or Delsym cough syrup that may be purchased over the counter.  Increase consumption of water and rest. Please notify me if no improvement in symptoms in 3-4 days.  It was a pleasure meeting you!  Upper Respiratory Infection, Adult Most upper respiratory infections (URIs) are a viral infection of the air passages leading to the lungs. A URI affects the nose, throat, and upper air passages. The most common type of URI is nasopharyngitis and is typically referred to as "the common cold." URIs run their course and usually go away on their own. Most of the time, a URI does not require medical attention, but sometimes a bacterial infection in the upper airways can follow a viral infection. This is called a secondary infection. Sinus and middle ear infections are common types of secondary upper respiratory infections. Bacterial pneumonia can also complicate a URI. A URI can worsen asthma and chronic obstructive pulmonary disease (COPD). Sometimes, these complications can require emergency medical care and may be life threatening.  CAUSES Almost all URIs are caused by viruses. A virus is a type of germ and can spread from one person to another.  RISKS FACTORS You may be at risk for a URI if:   You smoke.   You have chronic heart or lung disease.  You have a weakened defense (immune) system.   You are very young or very old.   You have nasal allergies or asthma.  You work in crowded or poorly ventilated areas.  You work in health care facilities or schools. SIGNS AND SYMPTOMS  Symptoms typically develop 2-3 days after you come in contact with a cold virus. Most viral URIs last 7-10 days. However, viral URIs from the influenza virus (flu virus) can last 14-18 days and are typically more severe. Symptoms may include:   Runny or stuffy (congested) nose.   Sneezing.    Cough.   Sore throat.   Headache.   Fatigue.   Fever.   Loss of appetite.   Pain in your forehead, behind your eyes, and over your cheekbones (sinus pain).  Muscle aches.  DIAGNOSIS  Your health care provider may diagnose a URI by:  Physical exam.  Tests to check that your symptoms are not due to another condition such as:  Strep throat.  Sinusitis.  Pneumonia.  Asthma. TREATMENT  A URI goes away on its own with time. It cannot be cured with medicines, but medicines may be prescribed or recommended to relieve symptoms. Medicines may help:  Reduce your fever.  Reduce your cough.  Relieve nasal congestion. HOME CARE INSTRUCTIONS   Take medicines only as directed by your health care provider.   Gargle warm saltwater or take cough drops to comfort your throat as directed by your health care provider.  Use a warm mist humidifier or inhale steam from a shower to increase air moisture. This may make it easier to breathe.  Drink enough fluid to keep your urine clear or pale yellow.   Eat soups and other clear broths and maintain good nutrition.   Rest as needed.   Return to work when your temperature has returned to normal or as your health care provider advises. You may need to stay home longer to avoid infecting others. You can also use a face mask and careful hand washing to prevent spread of the virus.  Increase the usage of your  inhaler if you have asthma.   Do not use any tobacco products, including cigarettes, chewing tobacco, or electronic cigarettes. If you need help quitting, ask your health care provider. PREVENTION  The best way to protect yourself from getting a cold is to practice good hygiene.   Avoid oral or hand contact with people with cold symptoms.   Wash your hands often if contact occurs.  There is no clear evidence that vitamin C, vitamin E, echinacea, or exercise reduces the chance of developing a cold. However, it is  always recommended to get plenty of rest, exercise, and practice good nutrition.  SEEK MEDICAL CARE IF:   You are getting worse rather than better.   Your symptoms are not controlled by medicine.   You have chills.  You have worsening shortness of breath.  You have brown or red mucus.  You have yellow or brown nasal discharge.  You have pain in your face, especially when you bend forward.  You have a fever.  You have swollen neck glands.  You have pain while swallowing.  You have white areas in the back of your throat. SEEK IMMEDIATE MEDICAL CARE IF:   You have severe or persistent:  Headache.  Ear pain.  Sinus pain.  Chest pain.  You have chronic lung disease and any of the following:  Wheezing.  Prolonged cough.  Coughing up blood.  A change in your usual mucus.  You have a stiff neck.  You have changes in your:  Vision.  Hearing.  Thinking.  Mood. MAKE SURE YOU:   Understand these instructions.  Will watch your condition.  Will get help right away if you are not doing well or get worse.   This information is not intended to replace advice given to you by your health care provider. Make sure you discuss any questions you have with your health care provider.   Document Released: 06/23/2000 Document Revised: 05/14/2014 Document Reviewed: 04/04/2013 Elsevier Interactive Patient Education [employer].

## 2015-02-27 NOTE — Progress Notes (Signed)
Pre visit review using our clinic review tool, if applicable. No additional management support is needed unless otherwise documented below in the visit note. 

## 2015-07-01 ENCOUNTER — Encounter: Payer: Self-pay | Admitting: Emergency Medicine

## 2015-07-01 ENCOUNTER — Emergency Department
Admission: EM | Admit: 2015-07-01 | Discharge: 2015-07-01 | Disposition: A | Discharge status: Home / Self Care | Payer: Self-pay | Attending: Emergency Medicine | Admitting: Emergency Medicine

## 2015-07-01 DIAGNOSIS — G8929 Other chronic pain: Secondary | ICD-10-CM | POA: Insufficient documentation

## 2015-07-01 DIAGNOSIS — F17210 Nicotine dependence, cigarettes, uncomplicated: Secondary | ICD-10-CM | POA: Insufficient documentation

## 2015-07-01 DIAGNOSIS — M542 Cervicalgia: Secondary | ICD-10-CM | POA: Insufficient documentation

## 2015-07-01 LAB — POCT PREGNANCY, URINE: PREG TEST UR: NEGATIVE

## 2015-07-01 MED ORDER — DIAZEPAM 2 MG PO TABS: 2.0000 mg | ORAL_TABLET | Freq: Three times a day (TID) | ORAL | Status: DC | PRN

## 2015-07-01 MED ORDER — MELOXICAM 15 MG PO TABS: 15.0000 mg | ORAL_TABLET | Freq: Every day | ORAL | Status: DC

## 2015-07-01 NOTE — Discharge Instructions (Signed)
Begin taking meloxicam 1 daily with food. Take diazepam 2 mg 3 times a day with food. The aware that this medication can cause drowsiness and if so you should not operate a car or any machinery. Moist heat or ice to your neck as needed for comfort. Follow-up with Dr. Joice LoftsPoggi if any continued problems with your neck.

## 2015-07-01 NOTE — ED Notes (Signed)
Pt toe d with c/o neck pain that started yesterday and has progressively gotten worse.  Pt denies injury.  Decreased ROM at triage.
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
## 2015-09-09 ENCOUNTER — Emergency Department
Admission: EM | Admit: 2015-09-09 | Discharge: 2015-09-09 | Disposition: A | Discharge status: Home / Self Care | Payer: Self-pay | Attending: Emergency Medicine | Admitting: Emergency Medicine

## 2015-09-09 DIAGNOSIS — J069 Acute upper respiratory infection, unspecified: Secondary | ICD-10-CM | POA: Insufficient documentation

## 2015-09-09 DIAGNOSIS — F17210 Nicotine dependence, cigarettes, uncomplicated: Secondary | ICD-10-CM | POA: Insufficient documentation

## 2015-09-09 DIAGNOSIS — J0100 Acute maxillary sinusitis, unspecified: Secondary | ICD-10-CM | POA: Insufficient documentation

## 2015-09-09 DIAGNOSIS — Z79899 Other long term (current) drug therapy: Secondary | ICD-10-CM | POA: Insufficient documentation

## 2015-09-09 MED ORDER — NAPROXEN 500 MG PO TABS: 500.0000 mg | ORAL_TABLET | Freq: Two times a day (BID) | ORAL | 0 refills | Status: DC

## 2015-09-09 MED ORDER — PREDNISONE 10 MG PO TABS: 50.0000 mg | ORAL_TABLET | Freq: Every day | ORAL | 0 refills | Status: DC

## 2015-09-09 MED ORDER — GUAIFENESIN-CODEINE 100-10 MG/5ML PO SYRP: 5.0000 mL | ORAL_SOLUTION | Freq: Three times a day (TID) | ORAL | 0 refills | Status: DC | PRN

## 2015-09-09 MED ORDER — AMOXICILLIN 500 MG PO TABS: 500.0000 mg | ORAL_TABLET | Freq: Three times a day (TID) | ORAL | 0 refills | Status: DC

## 2015-09-09 NOTE — ED Triage Notes (Signed)
Patient ambulatory to triage with steady gait, without difficulty or distress noted; pt reports sinus congestion and pressure x 2wks; OTC meds without relief

## 2015-09-09 NOTE — ED Provider Notes (Signed)
[facility] Emergency Department Provider Note  ____________________________________________  Time seen: Approximately 9:42 PM  I have reviewed the triage vital signs and the nursing notes.   HISTORY  Chief Complaint Nasal Congestion   HPI Amy Cabrera is a 20 y.o. female who presents to the emergency department for evaluation of sinus congestion and pressure for the past 2 weeks. She has taken Mucinex without relief. She denies fever.   No past medical history on file.  Patient Active Problem List   Diagnosis Date Noted  . Viral gastroenteritis 10/01/2014  . Sore throat 10/01/2014  . Allergic rhinitis 10/01/2014  . Low back pain 07/02/2014  . Hand dermatitis 07/02/2014  . 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  Current Outpatient Rx  . Order #: [id number] Class: Print  . Order #: [id number] Class: Print  . Order #: [id number] Class: Print  . Order #: [id number] Class: Print  . Order #: [id number] Class: Print  . Order #: [id number] Class: Print    Allergies Review of patient's allergies indicates no known allergies.  Family History  Problem Relation Age of Onset  . Cancer Maternal Grandmother     breast CA  . Heart disease Maternal Grandfather     CAD  . Cancer Maternal Grandfather     colon CA  . Diabetes Maternal Grandfather     Social History Social History  Substance Use Topics  . Smoking status: Current Every Day Smoker    Packs/day: 0.25    Years: 0.00    Types: Cigarettes  . Smokeless tobacco: Never Used  . Alcohol use No    Review of Systems Constitutional: Negative for fever/chills ENT: Negative for sore throat. Positive for bilateral sinus pressure and pain Cardiovascular: Denies chest pain. Respiratory: Negative for shortness of breath. Negative for cough. Gastrointestinal: Negative for nausea,  negative for vomiting.  Negative for diarrhea.  Musculoskeletal: Negative for body aches Skin:  Negative for rash. Neurological: Positive for headaches ____________________________________________   PHYSICAL EXAM:  VITAL SIGNS: ED Triage Vitals  Enc Vitals Group     BP 09/09/15 2041 121/71     Pulse Rate 09/09/15 2041 90     Resp 09/09/15 2041 20     Temp 09/09/15 2041 98.5 F (36.9 C)     Temp Source 09/09/15 2041 Oral     SpO2 09/09/15 2041 99 %     Weight 09/09/15 2040 223 lb (101.2 kg)     Height 09/09/15 2040 5\' 5"  (1.651 m)     Head Circumference --      Peak Flow --      Pain Score 09/09/15 2039 5     Pain Loc --      Pain Edu? --      Excl. in GC? --     Constitutional: Alert and oriented. Well appearing and in no acute distress. Eyes: Conjunctivae are normal. EOMI. Ears: Bilateral tympanic membranes within normal limits Nose: Maxillary sinus congestion; no rhinnorhea. Tenderness noted over the maxillary sinus, worse on the right. Mouth/Throat: Mucous membranes are moist.  Oropharynx normal. Tonsils appear normal. Neck: No stridor.  Lymphatic: No cervical lymphadenopathy. Cardiovascular: Normal rate, regular rhythm. Grossly normal heart sounds.  Good peripheral circulation. Respiratory: Normal respiratory effort.  No retractions. Clear to auscultation throughout. Gastrointestinal: Soft and nontender.  Musculoskeletal: FROM x 4 extremities.  Neurologic:  Normal speech and language.  Skin:  Skin is warm, dry and intact. No rash noted. Psychiatric: Mood and affect are normal.  Speech and behavior are normal.  ____________________________________________   LABS (all labs ordered are listed, but only abnormal results are displayed)  Labs Reviewed - No data to display ____________________________________________  EKG   ____________________________________________  RADIOLOGY   ____________________________________________   PROCEDURES  Procedure(s) performed: None  Critical Care performed:  No  ____________________________________________   INITIAL IMPRESSION / ASSESSMENT AND PLAN / ED COURSE  Clinical Course    Pertinent labs & imaging results that were available during my care of the patient were reviewed by me and considered in my medical decision making (see chart for details).   Patient was prescribed amoxicillin. She is to follow-up with the primary care provider for choice for symptoms that do not improve over the next 7 days. She is instructed to return to the emergency department for symptoms that change or worsen if she is unable schedule an appointment. ____________________________________________   FINAL CLINICAL IMPRESSION(S) / ED DIAGNOSES  Final diagnoses:  Acute maxillary sinusitis, recurrence not specified  URI (upper respiratory infection)    Note:  This document was prepared using Dragon voice recognition software and may include unintentional dictation errors.     Chinita Pester, FNP 09/10/15 1830    Arnaldo Natal, MD 09/10/15 [phone]

## 2015-09-09 NOTE — ED Notes (Signed)
Pt c/o sinus pressure and congestion x2 weeks 

## 2015-11-06 ENCOUNTER — Emergency Department (HOSPITAL_COMMUNITY)
Admission: EM | Admit: 2015-11-06 | Discharge: 2015-11-06 | Disposition: A | Discharge status: Home / Self Care | Payer: Self-pay | Attending: Emergency Medicine | Admitting: Emergency Medicine

## 2015-11-06 ENCOUNTER — Emergency Department (HOSPITAL_COMMUNITY): Payer: Self-pay

## 2015-11-06 ENCOUNTER — Encounter (HOSPITAL_COMMUNITY): Payer: Self-pay | Admitting: Emergency Medicine

## 2015-11-06 DIAGNOSIS — X503XXA Overexertion from repetitive movements, initial encounter: Secondary | ICD-10-CM | POA: Insufficient documentation

## 2015-11-06 DIAGNOSIS — Y9389 Activity, other specified: Secondary | ICD-10-CM | POA: Insufficient documentation

## 2015-11-06 DIAGNOSIS — Y929 Unspecified place or not applicable: Secondary | ICD-10-CM | POA: Insufficient documentation

## 2015-11-06 DIAGNOSIS — M25551 Pain in right hip: Secondary | ICD-10-CM | POA: Insufficient documentation

## 2015-11-06 DIAGNOSIS — F17210 Nicotine dependence, cigarettes, uncomplicated: Secondary | ICD-10-CM | POA: Insufficient documentation

## 2015-11-06 DIAGNOSIS — Y990 Civilian activity done for income or pay: Secondary | ICD-10-CM | POA: Insufficient documentation

## 2015-11-06 HISTORY — DX: Dorsalgia, unspecified: M54.9

## 2015-11-06 LAB — POC URINE PREG, ED: PREG TEST UR: NEGATIVE

## 2015-11-06 MED ORDER — NAPROXEN 500 MG PO TABS: 500.0000 mg | ORAL_TABLET | Freq: Two times a day (BID) | ORAL | 0 refills | Status: DC

## 2015-11-06 NOTE — ED Notes (Signed)
Papers and medications reviewed and pt. Verbalizes understanding  

## 2015-11-06 NOTE — ED Triage Notes (Signed)
Right hip pain starting yesterday. Denies any injury. Feels like pressure. Able to walk on it. Has not taken any medication for it.

## 2015-11-06 NOTE — ED Provider Notes (Signed)
[facility]-EMERGENCY DEPT Provider Note   CSN: [id number] Arrival date & time: 11/06/15  1527 By signing my name below, I, Amy Cabrera, attest that this documentation has been prepared under the direction and in the presence of Amy Mornavid Naeem Quillin, NP. Electronically Signed: Linna Darnerussell Cabrera, Scribe. 11/06/2015. 4:15 PM.  History   Chief Complaint Chief Complaint  Patient presents with  . Leg Pain    The history is provided by the patient. No language interpreter was used.  Leg Pain   This is a new problem. The current episode started yesterday. The problem occurs constantly. The problem has been gradually worsening. The pain is present in the right upper leg. The quality of the pain is described as constant. The pain is moderate. Pertinent negatives include no numbness and no tingling. Exacerbated by: weight bearing and ambulation. She has tried nothing for the symptoms. There has been no history of extremity trauma. Family history is significant for no rheumatoid arthritis and no gout.     HPI Comments: Amy Cabrera is a 20 y.o. female who presents to the Emergency Department complaining of sudden onset, constant, worsening, right hip pain beginning yesterday. Pt reports she works at [employer] and frequently lifts heavy items, but denies known injury to her right hip or cause of her right hip pain. Pt is unsure whether her pain is bony or muscular. She endorses pain exacerbation with ambulation and bearing weight on her right hip. No medications or treatments tried. She denies numbness, weakness, fever, chills, color change, wounds, or any other associated symptoms  Past Medical History:  Diagnosis Date  . Back pain     Patient Active Problem List   Diagnosis Date Noted  . Viral gastroenteritis 10/01/2014  . Sore throat 10/01/2014  . Allergic rhinitis 10/01/2014  . Low back pain 07/02/2014  . Hand dermatitis 07/02/2014  . 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 No pertinent surgical  history.  OB History    No data available       Home Medications    Prior to Admission medications   Medication Sig Start Date End Date Taking? Authorizing Provider  amoxicillin (AMOXIL) 500 MG tablet Take 1 tablet (500 mg total) by mouth 3 (three) times daily. 09/09/15   Chinita Pesterari B Triplett, FNP  diazepam (VALIUM) 2 MG tablet Take 1 tablet (2 mg total) by mouth every 8 (eight) hours as needed for muscle spasms. 07/01/15   Tommi Rumpshonda L Summers, PA-C  guaiFENesin-codeine (ROBITUSSIN AC) 100-10 MG/5ML syrup Take 5 mLs by mouth 3 (three) times daily as needed for cough. 09/09/15   Chinita Pesterari B Triplett, FNP  meloxicam (MOBIC) 15 MG tablet Take 1 tablet (15 mg total) by mouth daily. 07/01/15   Tommi Rumpshonda L Summers, PA-C  naproxen (NAPROSYN) 500 MG tablet Take 1 tablet (500 mg total) by mouth 2 (two) times daily with a meal. 09/09/15   Cari B Triplett, FNP  predniSONE (DELTASONE) 10 MG tablet Take 5 tablets (50 mg total) by mouth daily. 09/09/15   Chinita Pesterari B Triplett, FNP    Family History Family History  Problem Relation Age of Onset  . Cancer Maternal Grandmother     breast CA  . Heart disease Maternal Grandfather     CAD  . Cancer Maternal Grandfather     colon CA  . Diabetes Maternal Grandfather     Social History Social History  Substance Use Topics  . Smoking status: Current Every Day Smoker    Packs/day: 0.25    Years:  0.00    Types: Cigarettes  . Smokeless tobacco: Never Used  . Alcohol use No     Allergies   Review of patient's allergies indicates no known allergies.   Review of Systems Review of Systems  Constitutional: Negative for chills and fever.  Musculoskeletal: Positive for myalgias (right hip).  Skin: Negative for color change and wound.  Neurological: Negative for tingling, weakness and numbness.  All other systems reviewed and are negative.   Physical Exam Updated Vital Signs BP 124/71 (BP Location: Right Arm)   Pulse 109   Temp 98.3 F (36.8 C) (Oral)   Resp 18    Ht 5\' 6"  (1.676 m)   Wt 223 lb (101.2 kg)   LMP 10/12/2015 (Exact Date)   SpO2 100%   BMI 35.99 kg/m   Physical Exam  Constitutional: She is oriented to person, place, and time. She appears well-developed and well-nourished. No distress.  HENT:  Head: Normocephalic and atraumatic.  Eyes: Conjunctivae and EOM are normal.  Neck: Neck supple. No tracheal deviation present.  Cardiovascular: Normal rate.   Pulmonary/Chest: Effort normal. No respiratory distress.  Musculoskeletal: Normal range of motion. She exhibits tenderness.  Right hip tenderness to palpation, increased pain with ROM.  Neurological: She is alert and oriented to person, place, and time.  Skin: Skin is warm and dry.  Psychiatric: She has a normal mood and affect. Her behavior is normal.  Nursing note and vitals reviewed.   ED Treatments / Results  Labs (all labs ordered are listed, but only abnormal results are displayed) Labs Reviewed - No data to display  EKG  EKG Interpretation None       Radiology No results found.  Procedures Procedures (including critical care time)  DIAGNOSTIC STUDIES: Oxygen Saturation is 100% on RA, normal by my interpretation.    COORDINATION OF CARE: 4:20 PM Discussed treatment plan with pt at bedside and pt agreed to plan.  Medications Ordered in ED Medications - No data to display   Initial Impression / Assessment and Plan / ED Course  I have reviewed the triage vital signs and the nursing notes.  Pertinent labs & imaging results that were available during my care of the patient were reviewed by me and considered in my medical decision making (see chart for details).  Clinical Course   Patient X-Ray negative for obvious fracture or dislocation.  Pt advised to follow up with orthopedics. Conservative therapy recommended and discussed. Patient will be discharged home & is agreeable with above plan. Returns precautions discussed. Pt appears safe for discharge.  I  personally performed the services described in this documentation, which was scribed in my presence. The recorded information has been reviewed and is accurate.   Final Clinical Impressions(s) / ED Diagnoses   Final diagnoses:  Acute right hip pain    New Prescriptions New Prescriptions   NAPROXEN (NAPROSYN) 500 MG TABLET    Take 1 tablet (500 mg total) by mouth 2 (two) times daily.     Amy Morn, NP 11/06/15 1706    Pricilla Loveless, MD 11/08/15 [phone]

## 2015-11-06 NOTE — ED Notes (Signed)
Patient returned from xray.

## 2016-01-08 ENCOUNTER — Emergency Department
Admission: EM | Admit: 2016-01-08 | Discharge: 2016-01-08 | Disposition: A | Discharge status: Home / Self Care | Payer: Self-pay | Attending: Emergency Medicine | Admitting: Emergency Medicine

## 2016-01-08 ENCOUNTER — Encounter: Payer: Self-pay | Admitting: Emergency Medicine

## 2016-01-08 DIAGNOSIS — F17210 Nicotine dependence, cigarettes, uncomplicated: Secondary | ICD-10-CM | POA: Insufficient documentation

## 2016-01-08 DIAGNOSIS — R112 Nausea with vomiting, unspecified: Secondary | ICD-10-CM | POA: Insufficient documentation

## 2016-01-08 LAB — URINALYSIS, ROUTINE W REFLEX MICROSCOPIC
BILIRUBIN URINE: NEGATIVE
Glucose, UA: NEGATIVE mg/dL
Hgb urine dipstick: NEGATIVE
Ketones, ur: NEGATIVE mg/dL
Leukocytes, UA: NEGATIVE
NITRITE: NEGATIVE
PROTEIN: NEGATIVE mg/dL
SPECIFIC GRAVITY, URINE: 1.008 (ref 1.005–1.030)
pH: 6 (ref 5.0–8.0)

## 2016-01-08 LAB — CBC WITH DIFFERENTIAL/PLATELET
BASOS ABS: 0 10*3/uL (ref 0–0.1)
Basophils Relative: 0 %
EOS ABS: 0 10*3/uL (ref 0–0.7)
EOS PCT: 0 %
HCT: 37.8 % (ref 35.0–47.0)
HEMOGLOBIN: 12.9 g/dL (ref 12.0–16.0)
LYMPHS ABS: 1.4 10*3/uL (ref 1.0–3.6)
Lymphocytes Relative: 14 %
MCH: 27.5 pg (ref 26.0–34.0)
MCHC: 34.1 g/dL (ref 32.0–36.0)
MCV: 80.7 fL (ref 80.0–100.0)
Monocytes Absolute: 0.5 10*3/uL (ref 0.2–0.9)
Monocytes Relative: 5 %
NEUTROS PCT: 81 %
Neutro Abs: 7.8 10*3/uL — ABNORMAL HIGH (ref 1.4–6.5)
PLATELETS: 179 10*3/uL (ref 150–440)
RBC: 4.68 MIL/uL (ref 3.80–5.20)
RDW: 12.7 % (ref 11.5–14.5)
WBC: 9.7 10*3/uL (ref 3.6–11.0)

## 2016-01-08 LAB — BASIC METABOLIC PANEL
ANION GAP: 5 (ref 5–15)
BUN: 5 mg/dL — ABNORMAL LOW (ref 6–20)
CHLORIDE: 108 mmol/L (ref 101–111)
CO2: 24 mmol/L (ref 22–32)
Calcium: 9.4 mg/dL (ref 8.9–10.3)
Creatinine, Ser: 0.62 mg/dL (ref 0.44–1.00)
Glucose, Bld: 102 mg/dL — ABNORMAL HIGH (ref 65–99)
POTASSIUM: 3.3 mmol/L — AB (ref 3.5–5.1)
SODIUM: 137 mmol/L (ref 135–145)

## 2016-01-08 LAB — LIPASE, BLOOD: LIPASE: 17 U/L (ref 11–51)

## 2016-01-08 LAB — POCT PREGNANCY, URINE: PREG TEST UR: NEGATIVE

## 2016-01-08 MED ORDER — ONDANSETRON 4 MG PO TBDP
4.0000 mg | ORAL_TABLET | Freq: Once | ORAL | Status: AC
Start: 2016-01-08 — End: 2016-01-08
  Administered 2016-01-08: 4 mg via ORAL
  Filled 2016-01-08: qty 1

## 2016-01-08 MED ORDER — ONDANSETRON 4 MG PO TBDP: 4.0000 mg | ORAL_TABLET | Freq: Four times a day (QID) | ORAL | 0 refills | Status: DC | PRN

## 2016-01-08 NOTE — Discharge Instructions (Signed)
Your exam and labs are normal today. Use the anti-nausea medicine for symptom relief. Increase intake of Powerade/Gatorade and eat bland foods. Follow-up with your provider for continued symptoms.

## 2016-01-08 NOTE — ED Triage Notes (Addendum)
Pt to ed with c/o vomiting early am x 2 weeks, +nausea.   Denies abd pain, denies diarrhea.  States the vomiting resolves as the day progresses.  LPM DEC 11, skin turgor good,  Skin warm and dry.  Appears in no acute distress at this time.

## 2016-01-08 NOTE — ED Provider Notes (Signed)
[facility] Emergency Department Provider Note ____________________________________________  Time seen: 1614  I have reviewed the triage vital signs and the nursing notes.  HISTORY  Chief Complaint  Nausea and Emesis  HPI Amy Cabrera is a 20 y.o. female presents to the ED for evaluation of a two-week complaining of intermittent nausea and vomiting. The patient describes episodes of vomiting on several days of the week over the last 2 weeks. She denies any accompanying abdominal pain, diarrhea, constipation, fever, or anorexia. She denies any recent travel, sick contacts, or bad food exposures. She reports decreased appetite secondary to the nausea. She denies any bloody or bilious vomitus and reports that the episodes of vomiting very from 30 minutes to several hours after her solid food intake. Denies any history of gallstones, gastritis, reflux, or pancreatitis.  Past Medical History:  Diagnosis Date  . Back pain     Patient Active Problem List   Diagnosis Date Noted  . Viral gastroenteritis 10/01/2014  . Sore throat 10/01/2014  . Allergic rhinitis 10/01/2014  . Low back pain 07/02/2014  . Hand dermatitis 07/02/2014  . 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 No pertinent surgical history.  Prior to Admission medications   Medication Sig Start Date End Date Taking? Authorizing Provider  amoxicillin (AMOXIL) 500 MG tablet Take 1 tablet (500 mg total) by mouth 3 (three) times daily. 09/09/15   Chinita Pesterari B Triplett, FNP  diazepam (VALIUM) 2 MG tablet Take 1 tablet (2 mg total) by mouth every 8 (eight) hours as needed for muscle spasms. 07/01/15   Tommi Rumpshonda L Summers, PA-C  guaiFENesin-codeine (ROBITUSSIN AC) 100-10 MG/5ML syrup Take 5 mLs by mouth 3 (three) times daily as needed for cough. 09/09/15   Chinita Pesterari B Triplett, FNP  meloxicam (MOBIC) 15 MG tablet Take 1 tablet (15 mg total) by mouth daily. 07/01/15   Tommi Rumpshonda L Summers, PA-C  naproxen (NAPROSYN) 500 MG  tablet Take 1 tablet (500 mg total) by mouth 2 (two) times daily. 11/06/15   Felicie Mornavid Smith, NP  predniSONE (DELTASONE) 10 MG tablet Take 5 tablets (50 mg total) by mouth daily. 09/09/15   Chinita Pesterari B Triplett, FNP    Allergies Patient has no known allergies.  Family History  Problem Relation Age of Onset  . Cancer Maternal Grandmother     breast CA  . Heart disease Maternal Grandfather     CAD  . Cancer Maternal Grandfather     colon CA  . Diabetes Maternal Grandfather     Social History Social History  Substance Use Topics  . Smoking status: Current Every Day Smoker    Packs/day: 0.25    Years: 0.00    Types: Cigarettes  . Smokeless tobacco: Never Used  . Alcohol use No    Review of Systems  Constitutional: Negative for fever. Cardiovascular: Negative for chest pain. Respiratory: Negative for shortness of breath. Gastrointestinal: Negative for abdominal pain, and diarrhea. Reports non-bilious/non-bloody vomiting as above Genitourinary: Negative for dysuria. Musculoskeletal: Negative for back pain. Skin: Negative for rash. Neurological: Negative for headaches, focal weakness or numbness. ____________________________________________  PHYSICAL EXAM:  VITAL SIGNS: ED Triage Vitals  Enc Vitals Group     BP 01/08/16 1529 (!) 141/81     Pulse Rate 01/08/16 1529 95     Resp 01/08/16 1529 18     Temp 01/08/16 1529 98.9 F (37.2 C)     Temp Source 01/08/16 1529 Oral     SpO2 01/08/16 1529 100 %  Weight 01/08/16 1602 223 lb (101.2 kg)     Height --      Head Circumference --      Peak Flow --      Pain Score 01/08/16 1531 0     Pain Loc --      Pain Edu? --      Excl. in GC? --     Constitutional: Alert and oriented. Well appearing and in no distress. Head: Normocephalic and atraumatic. Eyes: Conjunctivae are normal. PERRL. Normal extraocular movements Cardiovascular: Normal rate, regular rhythm. Normal distal pulses. Respiratory: Normal respiratory effort. No  wheezes/rales/rhonchi. Gastrointestinal: Soft and nontender. No distention, Rebound, guarding, rigidity, or organomegaly. No CVA tenderness. Musculoskeletal: Nontender with normal range of motion in all extremities.  Neurologic:  Normal gait without ataxia. Normal speech and language. No gross focal neurologic deficits are appreciated. Skin:  Skin is warm, dry and intact. No rash noted. Psychiatric: Mood and affect are normal. Patient exhibits appropriate insight and judgment. ____________________________________________   LABS (pertinent positives/negatives) Labs Reviewed  URINALYSIS, ROUTINE W REFLEX MICROSCOPIC - Abnormal; Notable for the following:       Result Value   Color, Urine YELLOW (*)    APPearance CLEAR (*)    All other components within normal limits  CBC WITH DIFFERENTIAL/PLATELET - Abnormal; Notable for the following:    Neutro Abs 7.8 (*)    All other components within normal limits  BASIC METABOLIC PANEL - Abnormal; Notable for the following:    Potassium 3.3 (*)    Glucose, Bld 102 (*)    BUN 5 (*)    All other components within normal limits  LIPASE, BLOOD  POCT PREGNANCY, URINE  ____________________________________________  PROCEDURES  Zofran 4 mg ODT PO Challenge - Pedialyte  ____________________________________________  INITIAL IMPRESSION / ASSESSMENT AND PLAN / ED COURSE  Patient with a benign exam and a two-week complaining of intermittent nausea and vomiting unclear etiology at this time. Her labs are reassuring at this time that showed no acute infectious process, severe dehydration or indication of acute abdominal process. She'll be discharged with prescription for Zofran 2 doses for nausea. She is advised to follow-up with her primary care provider for ongoing symptom management and further evaluation as necessary.  Clinical Course    ____________________________________________  FINAL CLINICAL IMPRESSION(S) / ED DIAGNOSES  Final diagnoses:   Non-intractable vomiting with nausea, unspecified vomiting type      Lissa HoardJenise V Bacon Lynx Goodrich, PA-C 01/08/16 1749    Minna AntisKevin Paduchowski, MD 01/08/16 [phone]

## 2016-01-08 NOTE — ED Notes (Signed)
Lab notified to run urinalysis.

## 2016-03-05 ENCOUNTER — Inpatient Hospital Stay
Admission: EM | Admit: 2016-03-05 | Discharge: 2016-03-06 | DRG: 872 | Disposition: A | Discharge status: Home / Self Care | Payer: Self-pay | Attending: Internal Medicine | Admitting: Internal Medicine

## 2016-03-05 ENCOUNTER — Encounter: Payer: Self-pay | Admitting: Emergency Medicine

## 2016-03-05 ENCOUNTER — Emergency Department: Payer: Self-pay

## 2016-03-05 DIAGNOSIS — N12 Tubulo-interstitial nephritis, not specified as acute or chronic: Secondary | ICD-10-CM | POA: Diagnosis present

## 2016-03-05 DIAGNOSIS — A419 Sepsis, unspecified organism: Principal | ICD-10-CM | POA: Diagnosis present

## 2016-03-05 DIAGNOSIS — E876 Hypokalemia: Secondary | ICD-10-CM | POA: Diagnosis present

## 2016-03-05 DIAGNOSIS — N390 Urinary tract infection, site not specified: Secondary | ICD-10-CM | POA: Diagnosis present

## 2016-03-05 DIAGNOSIS — F17210 Nicotine dependence, cigarettes, uncomplicated: Secondary | ICD-10-CM | POA: Diagnosis present

## 2016-03-05 LAB — URINALYSIS, ROUTINE W REFLEX MICROSCOPIC
GLUCOSE, UA: 50 mg/dL — AB
Ketones, ur: 20 mg/dL — AB
NITRITE: NEGATIVE
PROTEIN: 100 mg/dL — AB
SPECIFIC GRAVITY, URINE: 1.02 (ref 1.005–1.030)
pH: 6 (ref 5.0–8.0)

## 2016-03-05 LAB — CBC WITH DIFFERENTIAL/PLATELET
BASOS ABS: 0 10*3/uL (ref 0–0.1)
BASOS PCT: 0 %
EOS ABS: 0 10*3/uL (ref 0–0.7)
Eosinophils Relative: 0 %
HCT: 39.1 % (ref 35.0–47.0)
Hemoglobin: 13 g/dL (ref 12.0–16.0)
LYMPHS ABS: 0.9 10*3/uL — AB (ref 1.0–3.6)
Lymphocytes Relative: 4 %
MCH: 27.3 pg (ref 26.0–34.0)
MCHC: 33.1 g/dL (ref 32.0–36.0)
MCV: 82.4 fL (ref 80.0–100.0)
Monocytes Absolute: 1.6 10*3/uL — ABNORMAL HIGH (ref 0.2–0.9)
Monocytes Relative: 7 %
NEUTROS PCT: 89 %
Neutro Abs: 19.5 10*3/uL — ABNORMAL HIGH (ref 1.4–6.5)
Platelets: 148 10*3/uL — ABNORMAL LOW (ref 150–440)
RBC: 4.75 MIL/uL (ref 3.80–5.20)
RDW: 12.8 % (ref 11.5–14.5)
WBC: 22 10*3/uL — AB (ref 3.6–11.0)

## 2016-03-05 LAB — BASIC METABOLIC PANEL
ANION GAP: 11 (ref 5–15)
BUN: 7 mg/dL (ref 6–20)
CHLORIDE: 102 mmol/L (ref 101–111)
CO2: 22 mmol/L (ref 22–32)
Calcium: 8.9 mg/dL (ref 8.9–10.3)
Creatinine, Ser: 0.73 mg/dL (ref 0.44–1.00)
Glucose, Bld: 126 mg/dL — ABNORMAL HIGH (ref 65–99)
POTASSIUM: 3.2 mmol/L — AB (ref 3.5–5.1)
SODIUM: 135 mmol/L (ref 135–145)

## 2016-03-05 LAB — LACTIC ACID, PLASMA
LACTIC ACID, VENOUS: 0.9 mmol/L (ref 0.5–1.9)
LACTIC ACID, VENOUS: 0.6 mmol/L (ref 0.5–1.9)

## 2016-03-05 LAB — TROPONIN I

## 2016-03-05 LAB — POCT PREGNANCY, URINE: PREG TEST UR: NEGATIVE

## 2016-03-05 MED ORDER — ONDANSETRON HCL 4 MG/2ML IJ SOLN
4.0000 mg | Freq: Once | INTRAMUSCULAR | Status: AC
Start: 2016-03-05 — End: 2016-03-05
  Administered 2016-03-05: 4 mg via INTRAVENOUS
  Filled 2016-03-05: qty 2

## 2016-03-05 MED ORDER — ACETAMINOPHEN 325 MG PO TABS
650.0000 mg | ORAL_TABLET | Freq: Once | ORAL | Status: AC
  Administered 2016-03-05: 650 mg via ORAL

## 2016-03-05 MED ORDER — CEFTRIAXONE SODIUM-DEXTROSE 2-2.22 GM-% IV SOLR
2.0000 g | INTRAVENOUS | Status: DC
  Filled 2016-03-05: qty 50

## 2016-03-05 MED ORDER — ENOXAPARIN SODIUM 40 MG/0.4ML ~~LOC~~ SOLN: 40.0000 mg | SUBCUTANEOUS | Status: DC

## 2016-03-05 MED ORDER — DEXTROSE 5 % IV SOLN: 2.0000 g | Freq: Once | INTRAVENOUS | Status: DC

## 2016-03-05 MED ORDER — ACETAMINOPHEN 650 MG RE SUPP: 650.0000 mg | Freq: Four times a day (QID) | RECTAL | Status: DC | PRN

## 2016-03-05 MED ORDER — SODIUM CHLORIDE 0.9 % IV SOLN
30.0000 meq | Freq: Once | INTRAVENOUS | Status: AC
  Administered 2016-03-06: 30 meq via INTRAVENOUS
  Filled 2016-03-05: qty 15

## 2016-03-05 MED ORDER — SODIUM CHLORIDE 0.9 % IV BOLUS (SEPSIS)
1000.0000 mL | Freq: Once | INTRAVENOUS | Status: AC
Start: 2016-03-05 — End: 2016-03-05
  Administered 2016-03-05: 1000 mL via INTRAVENOUS

## 2016-03-05 MED ORDER — ACETAMINOPHEN 325 MG PO TABS
ORAL_TABLET | ORAL | Status: AC
  Filled 2016-03-05: qty 2

## 2016-03-05 MED ORDER — ONDANSETRON HCL 4 MG PO TABS: 4.0000 mg | ORAL_TABLET | Freq: Four times a day (QID) | ORAL | Status: DC | PRN

## 2016-03-05 MED ORDER — SODIUM CHLORIDE 0.9 % IV SOLN
1000.0000 mL | Freq: Once | INTRAVENOUS | Status: AC
  Administered 2016-03-05: 1000 mL via INTRAVENOUS

## 2016-03-05 MED ORDER — ONDANSETRON HCL 4 MG/2ML IJ SOLN: 4.0000 mg | Freq: Four times a day (QID) | INTRAMUSCULAR | Status: DC | PRN

## 2016-03-05 MED ORDER — CEFTRIAXONE SODIUM-DEXTROSE 2-2.22 GM-% IV SOLR
2.0000 g | Freq: Once | INTRAVENOUS | Status: AC
  Administered 2016-03-05: 2 g via INTRAVENOUS
  Filled 2016-03-05: qty 50

## 2016-03-05 MED ORDER — ACETAMINOPHEN 325 MG PO TABS: 650.0000 mg | ORAL_TABLET | Freq: Four times a day (QID) | ORAL | Status: DC | PRN

## 2016-03-05 MED ORDER — IBUPROFEN 400 MG PO TABS
600.0000 mg | ORAL_TABLET | Freq: Four times a day (QID) | ORAL | Status: DC | PRN
  Administered 2016-03-06: 600 mg via ORAL
  Filled 2016-03-05: qty 2

## 2016-03-05 NOTE — H&P (Signed)
[facility] - [place] at [facility]   PATIENT NAME: Amy Cabrera    MR#:  [id number]  DATE OF BIRTH:  1995-04-18  DATE OF ADMISSION:  03/05/2016  PRIMARY CARE PHYSICIAN: Kerby Nora, MD   REQUESTING/REFERRING PHYSICIAN: Cyril Loosen, MD  CHIEF COMPLAINT:   Chief Complaint  Patient presents with  . Back Pain  . Emesis    HISTORY OF PRESENT ILLNESS:  Amy Cabrera  is a 21 y.o. female who presents with 2 days of back pain, nausea vomiting, malaise. Here in the ED the patient was borderline febrile, tachycardic, with an elevated white blood cell count and found to have UTI. In conjunction with the back pain she is felt to have sepsis due to pyelonephritis. Hospitalists were called for admission.  PAST MEDICAL HISTORY:   Past Medical History:  Diagnosis Date  . Back pain     PAST SURGICAL HISTORY:   Past Surgical History:  Procedure Laterality Date  . WISDOM TOOTH EXTRACTION      SOCIAL HISTORY:   Social History  Substance Use Topics  . Smoking status: Current Every Day Smoker    Packs/day: 0.50    Years: 0.00    Types: Cigarettes  . Smokeless tobacco: Never Used  . Alcohol use No    FAMILY HISTORY:   Family History  Problem Relation Age of Onset  . Cancer Maternal Grandmother     breast CA  . Heart disease Maternal Grandfather     CAD  . Cancer Maternal Grandfather     colon CA  . Diabetes Maternal Grandfather     DRUG ALLERGIES:  No Known Allergies  MEDICATIONS AT HOME:   Prior to Admission medications   Medication Sig Start Date End Date Taking? Authorizing Provider  ondansetron (ZOFRAN ODT) 4 MG disintegrating tablet Take 1 tablet (4 mg total) by mouth every 6 (six) hours as needed for nausea or vomiting. Patient not taking: Reported on 03/05/2016 01/08/16   Charlesetta Ivory Menshew, PA-C    REVIEW OF SYSTEMS:  Review of Systems  Constitutional: Positive for fever and malaise/fatigue. Negative for chills and weight loss.   HENT: Negative for ear pain, hearing loss and tinnitus.   Eyes: Negative for blurred vision, double vision, pain and redness.  Respiratory: Negative for cough, hemoptysis and shortness of breath.   Cardiovascular: Negative for chest pain, palpitations, orthopnea and leg swelling.  Gastrointestinal: Positive for nausea and vomiting. Negative for abdominal pain, constipation and diarrhea.  Genitourinary: Positive for flank pain and frequency. Negative for dysuria and hematuria.  Musculoskeletal: Negative for joint pain and neck pain.  Skin:       No acne, rash, or lesions  Neurological: Negative for dizziness, tremors, focal weakness and weakness.  Endo/Heme/Allergies: Negative for polydipsia. Does not bruise/bleed easily.  Psychiatric/Behavioral: Negative for depression. The patient is not nervous/anxious and does not have insomnia.      VITAL SIGNS:   Vitals:   03/05/16 2000 03/05/16 2110  BP: 104/66 107/67  Pulse: (!) 140 (!) 115  Resp: 18 12  Temp: 100.2 F (37.9 C)   TempSrc: Oral   SpO2: 100% 98%  Weight: 99.8 kg (220 lb)   Height: 5\' 6"  (1.676 m)    Wt Readings from Last 3 Encounters:  03/05/16 99.8 kg (220 lb)  01/08/16 101.2 kg (223 lb)  11/06/15 101.2 kg (223 lb)    PHYSICAL EXAMINATION:  Physical Exam  Vitals reviewed. Constitutional: She is oriented to person, place, and time. She  appears well-developed and well-nourished. No distress.  HENT:  Head: Normocephalic and atraumatic.  Mouth/Throat: Oropharynx is clear and moist.  Eyes: Conjunctivae and EOM are normal. Pupils are equal, round, and reactive to light. No scleral icterus.  Neck: Normal range of motion. Neck supple. No JVD present. No thyromegaly present.  Cardiovascular: Regular rhythm and intact distal pulses.  Exam reveals no gallop and no friction rub.   No murmur heard. Tachycardic  Respiratory: Effort normal and breath sounds normal. No respiratory distress. She has no wheezes. She has no rales.   GI: Soft. Bowel sounds are normal. She exhibits no distension. There is no tenderness.  Musculoskeletal: Normal range of motion. She exhibits tenderness (bilateral flank tendern). She exhibits no edema.  No arthritis, no gout  Lymphadenopathy:    She has no cervical adenopathy.  Neurological: She is alert and oriented to person, place, and time. No cranial nerve deficit.  No dysarthria, no aphasia  Skin: Skin is warm and dry. No rash noted. No erythema.  Psychiatric: She has a normal mood and affect. Her behavior is normal. Judgment and thought content normal.    LABORATORY PANEL:   CBC  Recent Labs Lab 03/05/16 2012  WBC 22.0*  HGB 13.0  HCT 39.1  PLT 148*   ------------------------------------------------------------------------------------------------------------------  Chemistries   Recent Labs Lab 03/05/16 2012  NA 135  K 3.2*  CL 102  CO2 22  GLUCOSE 126*  BUN 7  CREATININE 0.73  CALCIUM 8.9   ------------------------------------------------------------------------------------------------------------------  Cardiac Enzymes  Recent Labs Lab 03/05/16 2012  TROPONINI <0.03   ------------------------------------------------------------------------------------------------------------------  RADIOLOGY:  Dg Chest Port 1 View  Result Date: 03/05/2016 CLINICAL DATA:  21 y/o F; 1 week of worsening lower back pain. Emesis. EXAM: PORTABLE CHEST 1 VIEW COMPARISON:  None. FINDINGS: The heart size and mediastinal contours are within normal limits. Both lungs are clear. The visualized skeletal structures are unremarkable. IMPRESSION: No active disease. Electronically Signed   By: Mitzi HansenLance  Furusawa-Stratton M.D.   On: 03/05/2016 21:39    EKG:   Orders placed or performed during the hospital encounter of 03/05/16  . ED EKG  . ED EKG  . EKG 12-Lead  . EKG 12-Lead    IMPRESSION AND PLAN:  Principal Problem:   Sepsis (HCC) - broad IV antibiotics started in the ED  and continued on admission. Cultures sent from the ED. Actiq acid was within normal limits, patient is hemodynamically stable. Active Problems:   UTI (urinary tract infection) - antibiotics and cultures as above.   Hypokalemia - we will replace and monitor  All the records are reviewed and case discussed with ED provider. Management plans discussed with the patient and/or family.  DVT PROPHYLAXIS: SubQ lovenox  GI PROPHYLAXIS: None  ADMISSION STATUS: Inpatient  CODE STATUS:  Code Status History    This patient does not have a recorded code status. Please follow your organizational policy for patients in this situation.      TOTAL TIME TAKING CARE OF THIS PATIENT: 45 minutes.    Tammara Massing FIELDING 03/05/2016, 9:48 PM  [facility]  Office  [phone]  CC: Primary care physician; Kerby NoraAmy Bedsole, MD

## 2016-03-05 NOTE — ED Notes (Signed)
Report called to teresa rn floor nurse  

## 2016-03-05 NOTE — Progress Notes (Signed)
Pharmacy Antibiotic Note  Duard Larsenabitha N Pikus is a 21 y.o. female admitted on 03/05/2016 with UTI.  Pharmacy has been consulted for ceftriaxone dosing.  Plan: Ceftriaxone 2 grams q 24 hours ordered.  Height: 5\' 6"  (167.6 cm) Weight: 220 lb (99.8 kg) IBW/kg (Calculated) : 59.3  Temp (24hrs), Avg:99.8 F (37.7 C), Min:98.9 F (37.2 C), Max:100.2 F (37.9 C)   Recent Labs Lab 03/05/16 2012 03/05/16 2052  WBC 22.0*  --   CREATININE 0.73  --   LATICACIDVEN  --  0.9    Estimated Creatinine Clearance: 131.5 mL/min (by C-G formula based on SCr of 0.73 mg/dL).    No Known Allergies  Antimicrobials this admission: ceftriaxone /23 >>    >>   Dose adjustments this admission:   Microbiology results: 2/23 BCx: pending 2/23 UCx: pending       2/23 UA: LE(+) NO2(-) WBC TNTC  Thank you for allowing pharmacy to be a part of this patient's care.  Danyah Guastella S 03/05/2016 11:31 PM
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
## 2016-03-05 NOTE — ED Notes (Signed)
Report off to [facility] [place] [facility] rn.  Pt moved from room 34 to room 24.

## 2016-03-05 NOTE — ED Notes (Signed)
Dr. Willis at bedside  

## 2016-03-05 NOTE — ED Notes (Signed)
Pt has low back pain n/v today. Pt reports dysuria.  Sx worse today.  Iv fluids infusing.  md at bedside.

## 2016-03-05 NOTE — ED Provider Notes (Signed)
[facility] Department Provider Note   ____________________________________________    I have reviewed the triage vital signs and the nursing notes.   HISTORY  Chief Complaint Back Pain and Emesis     HPI Amy Cabrera is a 21 y.o. female who presents with complaints of moderate to severe, aching back pain, malaise and nausea and vomiting. Patient reports her mid to lower back bilaterally is been hurting for about one week. She denies cough or shortness of breath. No abdominal pain. Mild urinary hesitancy but no dysuria. No rash no history of the same. No sick contacts.   Past Medical History:  Diagnosis Date  . Back pain     Patient Active Problem List   Diagnosis Date Noted  . Viral gastroenteritis 10/01/2014  . Sore throat 10/01/2014  . Allergic rhinitis 10/01/2014  . Low back pain 07/02/2014  . Hand dermatitis 07/02/2014  . 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 WISDOM TOOTH EXTRACTION      Prior to Admission medications   Medication Sig Start Date End Date Taking? Authorizing Provider  ondansetron (ZOFRAN ODT) 4 MG disintegrating tablet Take 1 tablet (4 mg total) by mouth every 6 (six) hours as needed for nausea or vomiting. 01/08/16   Charlesetta Ivory Menshew, PA-C     Allergies Patient has no known allergies.  Family History  Problem Relation Age of Onset  . Cancer Maternal Grandmother     breast CA  . Heart disease Maternal Grandfather     CAD  . Cancer Maternal Grandfather     colon CA  . Diabetes Maternal Grandfather     Social History Social History  Substance Use Topics  . Smoking status: Current Every Day Smoker    Packs/day: 0.50    Years: 0.00    Types: Cigarettes  . Smokeless tobacco: Never Used  . Alcohol use No    Review of Systems  Constitutional: Objective fevers Eyes: No visual changes.  ENT: No sore throat. Cardiovascular: Denies chest  pain. Respiratory: Denies shortness of breath. No cough Gastrointestinal: As above Genitourinary: Negative for dysuria. Positive for hesitancy Musculoskeletal: As above Skin: Negative for rash. Neurological: Negative for headaches  10-point ROS otherwise negative.  ____________________________________________   PHYSICAL EXAM:  VITAL SIGNS: ED Triage Vitals [03/05/16 2000]  Enc Vitals Group     BP 104/66     Pulse Rate (!) 140     Resp 18     Temp 100.2 F (37.9 C)     Temp Source Oral     SpO2 100 %     Weight 220 lb (99.8 kg)     Height 5\' 6"  (1.676 m)     Head Circumference      Peak Flow      Pain Score 7     Pain Loc      Pain Edu?      Excl. in GC?     Constitutional: Alert and oriented. No acute distress. Ill-appearing Eyes: Conjunctivae are normal.   Nose: No congestion/rhinnorhea. Mouth/Throat: Mucous membranes are moist.    Cardiovascular: Tachycardia, regular rhythm. Grossly normal heart sounds.  Good peripheral circulation. Respiratory: Normal respiratory effort.  No retractions. Lungs CTAB. Gastrointestinal: Soft and nontender. No distention.  Bilateral CVA tenderness Genitourinary: deferred Musculoskeletal: No lower extremity tenderness nor edema.  Warm and well perfused Neurologic:  Normal speech and language. No gross focal neurologic deficits are appreciated.  Skin:  Skin is warm, dry and intact. No rash noted. Psychiatric: Mood and affect are normal. Speech and behavior are normal.  ____________________________________________   LABS (all labs ordered are listed, but only abnormal results are displayed)  Labs Reviewed  CBC WITH DIFFERENTIAL/PLATELET - Abnormal; Notable for the following:       Result Value   WBC 22.0 (*)    Platelets 148 (*)    Neutro Abs 19.5 (*)    Lymphs Abs 0.9 (*)    Monocytes Absolute 1.6 (*)    All other components within normal limits  BASIC METABOLIC PANEL - Abnormal; Notable for the following:    Potassium  3.2 (*)    Glucose, Bld 126 (*)    All other components within normal limits  URINALYSIS, ROUTINE W REFLEX MICROSCOPIC - Abnormal; Notable for the following:    Color, Urine AMBER (*)    APPearance CLOUDY (*)    Glucose, UA 50 (*)    Hgb urine dipstick SMALL (*)    Bilirubin Urine SMALL (*)    Ketones, ur 20 (*)    Protein, ur 100 (*)    Leukocytes, UA LARGE (*)    Bacteria, UA MANY (*)    Squamous Epithelial / LPF TOO NUMEROUS TO COUNT (*)    All other components within normal limits  CULTURE, BLOOD (ROUTINE X 2)  CULTURE, BLOOD (ROUTINE X 2)  CULTURE, BLOOD (ROUTINE X 2)  CULTURE, BLOOD (ROUTINE X 2)  URINE CULTURE  TROPONIN I  LACTIC ACID, PLASMA  LACTIC ACID, PLASMA  POCT PREGNANCY, URINE   ____________________________________________  EKG  ED ECG REPORT I, Jene Every, the attending physician, personally viewed and interpreted this ECG.  Date: 03/05/2016  Rate: 134 Rhythm: Sinus tachycardia QRS Axis: normal Intervals: normal ST/T Wave abnormalities: normal Conduction Disturbances: none Narrative Interpretation: unremarkable  ____________________________________________  RADIOLOGY  None ____________________________________________   PROCEDURES  Procedure(s) performed: No    Critical Care performed: yes  CRITICAL CARE Performed by: Jene Every   Total critical care time: 30 minutes  Critical care time was exclusive of separately billable procedures and treating other patients.  Critical care was necessary to treat or prevent imminent or life-threatening deterioration.  Critical care was time spent personally by me on the following activities: development of treatment plan with patient and/or surrogate as well as nursing, discussions with consultants, evaluation of patient's response to treatment, examination of patient, obtaining history from patient or surrogate, ordering and performing treatments and interventions, ordering and review of  laboratory studies, ordering and review of radiographic studies, pulse oximetry and re-evaluation of patient's condition.  ____________________________________________   INITIAL IMPRESSION / ASSESSMENT AND PLAN / ED COURSE  Pertinent labs & imaging results that were available during my care of the patient were reviewed by me and considered in my medical decision making (see chart for details).  Patient presents extremely tachycardic, febrile with complaints of bilateral back pain. White blood cell count significant elevated at 22,000. Urinalysis is consistent with infection making her presentation concerning for pyelonephritis with sepsis. We will call a code sepsis, treat with multiple boluses of IV fluid, Rocephin IV and admitted to the hospital    ____________________________________________   FINAL CLINICAL IMPRESSION(S) / ED DIAGNOSES  Final diagnoses:  Sepsis, due to unspecified organism [facility])  Pyelonephritis      NEW MEDICATIONS STARTED DURING THIS VISIT:  New Prescriptions   No medications on file     Note:  This document was prepared using Dragon  voice recognition software and may include unintentional dictation errors.    Jene Everyobert Caroll Cunnington, MD 03/05/16 2055

## 2016-03-06 LAB — BASIC METABOLIC PANEL
ANION GAP: 6 (ref 5–15)
BUN: 6 mg/dL (ref 6–20)
CALCIUM: 8.2 mg/dL — AB (ref 8.9–10.3)
CHLORIDE: 110 mmol/L (ref 101–111)
CO2: 23 mmol/L (ref 22–32)
Creatinine, Ser: 0.67 mg/dL (ref 0.44–1.00)
GFR calc non Af Amer: >60 mL/min (ref >60)
Glucose, Bld: 111 mg/dL — ABNORMAL HIGH (ref 65–99)
Potassium: 3.5 mmol/L (ref 3.5–5.1)
Sodium: 139 mmol/L (ref 135–145)

## 2016-03-06 LAB — CBC
HCT: 32 % — ABNORMAL LOW (ref 35.0–47.0)
HEMOGLOBIN: 11.1 g/dL — AB (ref 12.0–16.0)
MCH: 28.4 pg (ref 26.0–34.0)
MCHC: 34.6 g/dL (ref 32.0–36.0)
MCV: 82 fL (ref 80.0–100.0)
Platelets: 119 10*3/uL — ABNORMAL LOW (ref 150–440)
RBC: 3.91 MIL/uL (ref 3.80–5.20)
RDW: 12.9 % (ref 11.5–14.5)
WBC: 13.6 10*3/uL — ABNORMAL HIGH (ref 3.6–11.0)

## 2016-03-06 MED ORDER — SODIUM CHLORIDE 0.9 % IV SOLN
INTRAVENOUS | Status: DC
  Administered 2016-03-06: 10:00:00 via INTRAVENOUS

## 2016-03-06 MED ORDER — CEPHALEXIN 500 MG PO CAPS: 500.0000 mg | ORAL_CAPSULE | Freq: Four times a day (QID) | ORAL | 0 refills | Status: DC

## 2016-03-06 MED ORDER — CEFTRIAXONE SODIUM 2 G IJ SOLR
2.0000 g | INTRAMUSCULAR | Status: DC
  Filled 2016-03-06: qty 2

## 2016-03-06 NOTE — Discharge Summary (Signed)
[facility] - [place] at [facility]   PATIENT NAME: Amy Cabrera    MR#:  [id number]  DATE OF BIRTH:  04/09/1995  DATE OF ADMISSION:  03/05/2016   ADMITTING PHYSICIAN: Oralia Manisavid Willis, MD  DATE OF DISCHARGE: 03/06/2016 PRIMARY CARE PHYSICIAN: Kerby NoraAmy Bedsole, MD   ADMISSION DIAGNOSIS:  Pyelonephritis [N12] Sepsis, due to unspecified organism (HCC) [A41.9] DISCHARGE DIAGNOSIS:  Principal Problem:   Sepsis (HCC) Active Problems:   UTI (urinary tract infection)   Hypokalemia Sepsis due to pyelonephritis SECONDARY DIAGNOSIS:   Past Medical History:  Diagnosis Date  . Back pain    HOSPITAL COURSE:   Sepsis due to pyelonephritis - broad IV antibiotics started in the ED. On rocephin, change to keflex po.   follow-up urine cultures as outpatient. The patient denies any symptoms. She feels much better. Hypokalemia, improved with supplements. Tobacco abuse. Smoking cessation was counseled for 4 minutes. DISCHARGE CONDITIONS:  Stable, discharge to home today. CONSULTS OBTAINED:   DRUG ALLERGIES:  No Known Allergies DISCHARGE MEDICATIONS:   Allergies as of 03/06/2016   No Known Allergies     Medication List    TAKE these medications   cephALEXin 500 MG capsule Commonly known as:  KEFLEX Take 1 capsule (500 mg total) by mouth 4 (four) times daily.   ondansetron 4 MG disintegrating tablet Commonly known as:  ZOFRAN ODT Take 1 tablet (4 mg total) by mouth every 6 (six) hours as needed for nausea or vomiting.        DISCHARGE INSTRUCTIONS:  See AVS.  If you experience worsening of your admission symptoms, develop shortness of breath, life threatening emergency, suicidal or homicidal thoughts you must seek medical attention immediately by calling 911 or calling your MD immediately  if symptoms less severe.  You Must read complete instructions/literature along with all the possible adverse reactions/side effects for all the Medicines you take and that  have been prescribed to you. Take any new Medicines after you have completely understood and accpet all the possible adverse reactions/side effects.   Please note  You were cared for by a hospitalist during your hospital stay. If you have any questions about your discharge medications or the care you received while you were in the hospital after you are discharged, you can call the unit and asked to speak with the hospitalist on call if the hospitalist that took care of you is not available. Once you are discharged, your primary care physician will handle any further medical issues. Please note that NO REFILLS for any discharge medications will be authorized once you are discharged, as it is imperative that you return to your primary care physician (or establish a relationship with a primary care physician if you do not have one) for your aftercare needs so that they can reassess your need for medications and monitor your lab values.    On the day of Discharge:  VITAL SIGNS:  Blood pressure 109/61, pulse 92, temperature 98.9 F (37.2 C), temperature source Oral, resp. rate 12, height 5\' 6"  (1.676 m), weight 226 lb (102.5 kg), last menstrual period 02/13/2016, SpO2 99 %. PHYSICAL EXAMINATION:  GENERAL:  21 y.o.-year-old patient lying in the bed with no acute distress.  EYES: Pupils equal, round, reactive to light and accommodation. No scleral icterus. Extraocular muscles intact.  HEENT: Head atraumatic, normocephalic. Oropharynx and nasopharynx clear.  NECK:  Supple, no jugular venous distention. No thyroid enlargement, no tenderness.  LUNGS: Normal breath sounds bilaterally, no wheezing, rales,rhonchi or crepitation.  No use of accessory muscles of respiration.  CARDIOVASCULAR: S1, S2 normal. No murmurs, rubs, or gallops.  ABDOMEN: Soft, non-tender, non-distended. Bowel sounds present. No organomegaly or mass.  EXTREMITIES: No pedal edema, cyanosis, or clubbing.  NEUROLOGIC: Cranial nerves II  through XII are intact. Muscle strength 5/5 in all extremities. Sensation intact. Gait not checked.  PSYCHIATRIC: The patient is alert and oriented x 3.  SKIN: No obvious rash, lesion, or ulcer.  DATA REVIEW:   CBC  Recent Labs Lab 03/06/16 0335  WBC 13.6*  HGB 11.1*  HCT 32.0*  PLT 119*    Chemistries   Recent Labs Lab 03/06/16 0335  NA 139  K 3.5  CL 110  CO2 23  GLUCOSE 111*  BUN 6  CREATININE 0.67  CALCIUM 8.2*     Microbiology Results  Results for orders placed or performed during the hospital encounter of 03/05/16  Blood culture (routine x 2)     Status: None (Preliminary result)   Collection Time: 03/05/16  8:52 PM  Result Value Ref Range Status   Specimen Description BLOOD LEFT HAND  Final   Special Requests   Final    BOTTLES DRAWN AEROBIC AND ANAEROBIC 12CCAERO,11CCANA   Culture NO GROWTH < 12 HOURS  Final   Report Status PENDING  Incomplete  Blood Culture (routine x 2)     Status: None (Preliminary result)   Collection Time: 03/05/16  8:52 PM  Result Value Ref Range Status   Specimen Description BLOOD RIGHT HAND  Final   Special Requests BOTTLES DRAWN AEROBIC AND ANAEROBIC 4CCAERO,4CCANA  Final   Culture NO GROWTH < 12 HOURS  Final   Report Status PENDING  Incomplete    RADIOLOGY:  Dg Chest Port 1 View  Result Date: 03/05/2016 CLINICAL DATA:  21 y/o F; 1 week of worsening lower back pain. Emesis. EXAM: PORTABLE CHEST 1 VIEW COMPARISON:  None. FINDINGS: The heart size and mediastinal contours are within normal limits. Both lungs are clear. The visualized skeletal structures are unremarkable. IMPRESSION: No active disease. Electronically Signed   By: Mitzi Hansen M.D.   On: 03/05/2016 21:39     Management plans discussed with the patient, family and they are in agreement.  CODE STATUS: Full Code   TOTAL TIME TAKING CARE OF THIS PATIENT: 26 minutes.    Shaune Pollack M.D on 03/06/2016 at 12:41 PM  Between 7am to 6pm - Pager -  [phone]  After 6pm go to [email or web address] - Social research officer, government  [facility] [place] [facility]  Office  [phone]  CC: Primary care physician; Kerby Nora, MD   Note: This dictation was prepared with Dragon dictation along with smaller phrase technology. Any transcriptional errors that result from this process are unintentional.

## 2016-03-06 NOTE — Progress Notes (Signed)
Discharge home, prescription given, Iv site on left and right hand removed tolerated well, refused wheel chair, left ambulating.

## 2016-03-06 NOTE — Discharge Instructions (Signed)
Regular diet

## 2016-03-07 ENCOUNTER — Emergency Department
Admission: EM | Admit: 2016-03-07 | Discharge: 2016-03-07 | Disposition: A | Discharge status: Home / Self Care | Payer: Self-pay | Attending: Emergency Medicine | Admitting: Emergency Medicine

## 2016-03-07 ENCOUNTER — Encounter: Payer: Self-pay | Admitting: *Deleted

## 2016-03-07 DIAGNOSIS — N12 Tubulo-interstitial nephritis, not specified as acute or chronic: Secondary | ICD-10-CM | POA: Insufficient documentation

## 2016-03-07 DIAGNOSIS — Z79899 Other long term (current) drug therapy: Secondary | ICD-10-CM | POA: Insufficient documentation

## 2016-03-07 DIAGNOSIS — F17210 Nicotine dependence, cigarettes, uncomplicated: Secondary | ICD-10-CM | POA: Insufficient documentation

## 2016-03-07 LAB — URINE CULTURE

## 2016-03-07 LAB — URINALYSIS, COMPLETE (UACMP) WITH MICROSCOPIC
Bilirubin Urine: NEGATIVE
GLUCOSE, UA: NEGATIVE mg/dL
Ketones, ur: 20 mg/dL — AB
NITRITE: NEGATIVE
PH: 6 (ref 5.0–8.0)
Protein, ur: NEGATIVE mg/dL
SPECIFIC GRAVITY, URINE: 1.006 (ref 1.005–1.030)

## 2016-03-07 LAB — BASIC METABOLIC PANEL
Anion gap: 9 (ref 5–15)
BUN: 8 mg/dL (ref 6–20)
CO2: 20 mmol/L — AB (ref 22–32)
CREATININE: 0.61 mg/dL (ref 0.44–1.00)
Calcium: 9 mg/dL (ref 8.9–10.3)
Chloride: 107 mmol/L (ref 101–111)
GFR calc Af Amer: >60 mL/min (ref >60)
GFR calc non Af Amer: >60 mL/min (ref >60)
GLUCOSE: 105 mg/dL — AB (ref 65–99)
Potassium: 3.4 mmol/L — ABNORMAL LOW (ref 3.5–5.1)
Sodium: 136 mmol/L (ref 135–145)

## 2016-03-07 LAB — POC URINE PREG, ED: Preg Test, Ur: NEGATIVE

## 2016-03-07 LAB — CBC
HCT: 35.6 % (ref 35.0–47.0)
Hemoglobin: 11.8 g/dL — ABNORMAL LOW (ref 12.0–16.0)
MCH: 27.3 pg (ref 26.0–34.0)
MCHC: 33.2 g/dL (ref 32.0–36.0)
MCV: 82.3 fL (ref 80.0–100.0)
PLATELETS: 137 10*3/uL — AB (ref 150–440)
RBC: 4.33 MIL/uL (ref 3.80–5.20)
RDW: 12.7 % (ref 11.5–14.5)
WBC: 13.3 10*3/uL — ABNORMAL HIGH (ref 3.6–11.0)

## 2016-03-07 MED ORDER — TRAMADOL HCL 50 MG PO TABS: 50.0000 mg | ORAL_TABLET | Freq: Four times a day (QID) | ORAL | 0 refills | Status: DC | PRN

## 2016-03-07 MED ORDER — OXYCODONE-ACETAMINOPHEN 5-325 MG PO TABS
1.0000 | ORAL_TABLET | Freq: Once | ORAL | Status: AC
  Administered 2016-03-07: 1 via ORAL
  Filled 2016-03-07: qty 1

## 2016-03-07 MED ORDER — SODIUM CHLORIDE 0.9 % IV BOLUS (SEPSIS)
1000.0000 mL | Freq: Once | INTRAVENOUS | Status: AC
  Administered 2016-03-07: 1000 mL via INTRAVENOUS

## 2016-03-07 MED ORDER — DEXTROSE 5 % IV SOLN
1.0000 g | Freq: Once | INTRAVENOUS | Status: AC
  Administered 2016-03-07: 1 g via INTRAVENOUS

## 2016-03-07 MED ORDER — CEFTRIAXONE SODIUM-DEXTROSE 1-3.74 GM-% IV SOLR
INTRAVENOUS | Status: AC
  Filled 2016-03-07: qty 50

## 2016-03-07 NOTE — ED Triage Notes (Signed)
Pt to ED reporting bilateral flank pain for the past week. Pt was seen in ED last night and d/c with antibiotics. Pt reports fevers persist today nd pain has worsened into lower abd cramping today. Pt denies changes in urine. Pt reports having ibuprofen 1 hour ago. Pt is afebrile at this time.

## 2016-03-07 NOTE — ED Provider Notes (Signed)
[facility] Emergency Department Provider Note   ____________________________________________   First MD Initiated Contact with Patient 03/07/16 [phone]     (approximate)  I have reviewed the triage vital signs and the nursing notes.   HISTORY  Chief Complaint Flank Pain    HPI Amy Cabrera is a 21 y.o. female who comes into the hospital today with some back pain and fever. The patient was here yesterday and was told that she had a UTI to progress with kidney infection. She reports that her back is still really hurting badly and her temperature got up to 102.5 today. The patient took 4 ibuprofen around 10:30 or 11 when she arrived her temperature was improved. The patient also took a cold shower at home. The patient's significant other reports that they spoke to a friend who told her she should come back in to get checked out to make sure she does not a blood infection. The patient has been able to eat today without any vomiting. She rates her pain a 6 out of 10 in intensity.   Past Medical History:  Diagnosis Date  . Back pain     Patient Active Problem List   Diagnosis Date Noted  . Sepsis (HCC) 03/05/2016  . UTI (urinary tract infection) 03/05/2016  . Hypokalemia 03/05/2016  . Viral gastroenteritis 10/01/2014  . Sore throat 10/01/2014  . Allergic rhinitis 10/01/2014  . Low back pain 07/02/2014  . Hand dermatitis 07/02/2014  . 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 WISDOM TOOTH EXTRACTION      Prior to Admission medications   Medication Sig Start Date End Date Taking? Authorizing Provider  cephALEXin (KEFLEX) 500 MG capsule Take 1 capsule (500 mg total) by mouth 4 (four) times daily. 03/06/16   Shaune Pollack, MD  ondansetron (ZOFRAN ODT) 4 MG disintegrating tablet Take 1 tablet (4 mg total) by mouth every 6 (six) hours as needed for nausea or vomiting. Patient not taking: Reported on 03/05/2016 01/08/16    Charlesetta Ivory Menshew, PA-C  traMADol (ULTRAM) 50 MG tablet Take 1 tablet (50 mg total) by mouth every 6 (six) hours as needed. 03/07/16   Rebecka Apley, MD    Allergies Patient has no known allergies.  Family History  Problem Relation Age of Onset  . Cancer Maternal Grandmother     breast CA  . Heart disease Maternal Grandfather     CAD  . Cancer Maternal Grandfather     colon CA  . Diabetes Maternal Grandfather     Social History Social History  Substance Use Topics  . Smoking status: Current Every Day Smoker    Packs/day: 0.50    Years: 0.00    Types: Cigarettes  . Smokeless tobacco: Never Used  . Alcohol use No    Review of Systems Constitutional: No fever/chills Eyes: No visual changes. ENT: No sore throat. Cardiovascular: Denies chest pain. Respiratory: Denies shortness of breath. Gastrointestinal: No abdominal pain.  No nausea, no vomiting.  No diarrhea.  No constipation. Genitourinary: low back pain to palpation Musculoskeletal: Negative for back pain. Skin: Negative for rash. Neurological: Negative for headaches, focal weakness or numbness.  10-point ROS otherwise negative.  ____________________________________________   PHYSICAL EXAM:  VITAL SIGNS: ED Triage Vitals  Enc Vitals Group     BP 03/07/16 0027 110/64     Pulse Rate 03/07/16 0027 (!) 108     Resp 03/07/16 0027 16  Temp 03/07/16 0027 98.9 F (37.2 C)     Temp Source 03/07/16 0027 Oral     SpO2 03/07/16 0027 98 %     Weight 03/07/16 0028 226 lb (102.5 kg)     Height 03/07/16 0028 5\' 6"  (1.676 m)     Head Circumference --      Peak Flow --      Pain Score 03/07/16 0028 5     Pain Loc --      Pain Edu? --      Excl. in GC? --     Constitutional: Alert and oriented. Well appearing and in mild distress. Eyes: Conjunctivae are normal. PERRL. EOMI. Head: Atraumatic. Nose: No congestion/rhinnorhea. Mouth/Throat: Mucous membranes are moist.  Oropharynx  non-erythematous. Cardiovascular: Normal rate, regular rhythm. Grossly normal heart sounds.  Good peripheral circulation. Respiratory: Normal respiratory effort.  No retractions. Lungs CTAB. Gastrointestinal: Soft and nontender. No distention. Positive bowel sounds Musculoskeletal: No lower extremity tenderness nor edema.   Neurologic:  Normal speech and language.  Skin:  Skin is warm, dry and intact.  Psychiatric: Mood and affect are normal.   ____________________________________________   LABS (all labs ordered are listed, but only abnormal results are displayed)  Labs Reviewed  URINALYSIS, COMPLETE (UACMP) WITH MICROSCOPIC - Abnormal; Notable for the following:       Result Value   Color, Urine YELLOW (*)    APPearance CLEAR (*)    Hgb urine dipstick MODERATE (*)    Ketones, ur 20 (*)    Leukocytes, UA TRACE (*)    Bacteria, UA RARE (*)    Squamous Epithelial / LPF 0-5 (*)    All other components within normal limits  BASIC METABOLIC PANEL - Abnormal; Notable for the following:    Potassium 3.4 (*)    CO2 20 (*)    Glucose, Bld 105 (*)    All other components within normal limits  CBC - Abnormal; Notable for the following:    WBC 13.3 (*)    Hemoglobin 11.8 (*)    Platelets 137 (*)    All other components within normal limits  POC URINE PREG, ED   ____________________________________________  EKG  none ____________________________________________  RADIOLOGY  none ____________________________________________   PROCEDURES  Procedure(s) performed: None  Procedures  Critical Care performed: No  ____________________________________________   INITIAL IMPRESSION / ASSESSMENT AND PLAN / ED COURSE  Pertinent labs & imaging results that were available during my care of the patient were reviewed by me and considered in my medical decision making (see chart for details).  This is a 21 year old female who comes into the hospital today with some back pain and  fever. The patient was admitted to the hospital yesterday with pyelonephritis and discharged today. I did inform the patient that with pyelonephritis she would still have some fevers as well as some pain for the next few days. The patient reports that she didn't pick up her antibiotics and took the first dose. As the patient is tachycardic and we'll give her a liter of normal saline. I will also give her a dose of ceftriaxone. Her blood work is improved compared to her labs from previous. When she's received her medication and her vital signs and improved she'll be discharged to home.      ____________________________________________   FINAL CLINICAL IMPRESSION(S) / ED DIAGNOSES  Final diagnoses:  Pyelonephritis      NEW MEDICATIONS STARTED DURING THIS VISIT:  New Prescriptions   TRAMADOL (ULTRAM) 50 MG TABLET  Take 1 tablet (50 mg total) by mouth every 6 (six) hours as needed.     Note:  This document was prepared using Dragon voice recognition software and may include unintentional dictation errors.    Rebecka ApleyAllison P Rebbeca Sheperd, MD 03/07/16 [phone]

## 2016-03-07 NOTE — ED Notes (Signed)
E-signature pad not working in room. 

## 2016-03-08 ENCOUNTER — Telehealth: Payer: Self-pay

## 2016-03-08 NOTE — Telephone Encounter (Signed)
LM requesting return call to complete TCM and schedule hospital f/u.  

## 2016-03-10 LAB — CULTURE, BLOOD (ROUTINE X 2)
CULTURE: NO GROWTH
Culture: NO GROWTH

## 2016-03-12 ENCOUNTER — Encounter: Payer: Self-pay | Admitting: Family Medicine

## 2016-03-12 ENCOUNTER — Ambulatory Visit (INDEPENDENT_AMBULATORY_CARE_PROVIDER_SITE_OTHER): Payer: Self-pay | Admitting: Family Medicine

## 2016-03-12 VITALS — BP 90/66 | HR 113 | Temp 98.5°F | Ht 65.5 in | Wt 207.5 lb

## 2016-03-12 DIAGNOSIS — E876 Hypokalemia: Secondary | ICD-10-CM

## 2016-03-12 DIAGNOSIS — A419 Sepsis, unspecified organism: Secondary | ICD-10-CM

## 2016-03-12 DIAGNOSIS — N12 Tubulo-interstitial nephritis, not specified as acute or chronic: Secondary | ICD-10-CM | POA: Insufficient documentation

## 2016-03-12 DIAGNOSIS — R000 Tachycardia, unspecified: Secondary | ICD-10-CM

## 2016-03-12 LAB — CBC WITH DIFFERENTIAL/PLATELET
BASOS ABS: 0 {cells}/uL (ref 0–200)
Basophils Relative: 0 %
Eosinophils Absolute: 115 cells/uL (ref 15–500)
Eosinophils Relative: 1 %
HEMATOCRIT: 42.8 % (ref 35.0–45.0)
HEMOGLOBIN: 14 g/dL (ref 11.7–15.5)
LYMPHS ABS: 1955 {cells}/uL (ref 850–3900)
Lymphocytes Relative: 17 %
MCH: 27.1 pg (ref 27.0–33.0)
MCHC: 32.7 g/dL (ref 32.0–36.0)
MCV: 82.8 fL (ref 80.0–100.0)
MONO ABS: 690 {cells}/uL (ref 200–950)
MPV: 10.4 fL (ref 7.5–12.5)
Monocytes Relative: 6 %
NEUTROS ABS: 8740 {cells}/uL — AB (ref 1500–7800)
NEUTROS PCT: 76 %
Platelets: 323 10*3/uL (ref 140–400)
RBC: 5.17 MIL/uL — ABNORMAL HIGH (ref 3.80–5.10)
RDW: 13.1 % (ref 11.0–15.0)
WBC: 11.5 10*3/uL — ABNORMAL HIGH (ref 3.8–10.8)

## 2016-03-12 LAB — BASIC METABOLIC PANEL
BUN: 9 mg/dL (ref 7–25)
CALCIUM: 10.1 mg/dL (ref 8.6–10.2)
CO2: 24 mmol/L (ref 20–31)
Chloride: 104 mmol/L (ref 98–110)
Creat: 0.74 mg/dL (ref 0.50–1.10)
Glucose, Bld: 90 mg/dL (ref 65–99)
POTASSIUM: 4.6 mmol/L (ref 3.5–5.3)
Sodium: 139 mmol/L (ref 135–146)

## 2016-03-12 LAB — POC URINALSYSI DIPSTICK (AUTOMATED)
BILIRUBIN UA: NEGATIVE
Blood, UA: NEGATIVE
GLUCOSE UA: NEGATIVE
Ketones, UA: NEGATIVE
Leukocytes, UA: NEGATIVE
Nitrite, UA: NEGATIVE
Protein, UA: NEGATIVE
Urobilinogen, UA: 0.2
pH, UA: 6

## 2016-03-12 NOTE — Progress Notes (Signed)
Pre visit review using our clinic review tool, if applicable. No additional management support is needed unless otherwise documented below in the visit note. 

## 2016-03-12 NOTE — Assessment & Plan Note (Signed)
Re-eval. 

## 2016-03-12 NOTE — Patient Instructions (Addendum)
Stop at lab on way out.  Push water.

## 2016-03-12 NOTE — Assessment & Plan Note (Signed)
Resolved on UA today.Amy Cabrera. Re-eval wbc. Low back pain may be MSK strain as she has had history of low back pain in past.

## 2016-03-12 NOTE — Assessment & Plan Note (Signed)
Unclear cause.Marland Kitchen. No clear dehydration. Will need to determine if remaining high when feeling well. At all past sick visits HR > 100.

## 2016-03-12 NOTE — Assessment & Plan Note (Signed)
Blood cultures neg.

## 2016-03-12 NOTE — Progress Notes (Signed)
   Subjective:    Patient ID: Amy Cabrera, female    DOB: 08/28/1995, 21 y.o.   MRN: [id number]  HPI   21 year old female presents for hospital follow up of pyelonephritis. Admitted to hospital on 2/23 until 2/24 for pyelonephritis, sepsis.  Given IV antibiotics.  Changed to keflex po.  Hypokalemia treated with supplements.  She was seen again  on 03/08/2015  In ER of back pain and  Fever. Missed  dose out of hospital of antibiotics.. So was given ceftriaxone IM x 1.   cbc 22 on admission,  13 on discharge Potassium 3.2-3.5  Urine culture showed multiple species.  Blood culture: neg x 5 days.  Today she reports:  She is feeling much better, still having a little bilateral low back pain.  No dysuria, no urgency, no frequency, no fever.  No emesis.   Good po intake, lots of water.  Review of Systems  Constitutional: Negative for fatigue and fever.  HENT: Negative for ear pain.   Eyes: Negative for pain.  Respiratory: Negative for chest tightness and shortness of breath.   Cardiovascular: Negative for chest pain, palpitations and leg swelling.  Gastrointestinal: Negative for abdominal pain.  Genitourinary: Negative for dysuria.       Objective:   Physical Exam  Constitutional: Vital signs are normal. She appears well-developed and well-nourished. She is cooperative.  Non-toxic appearance. She does not appear ill. No distress.  HENT:  Head: Normocephalic.  Right Ear: Hearing, tympanic membrane, external ear and ear canal normal. Tympanic membrane is not erythematous, not retracted and not bulging.  Left Ear: Hearing, tympanic membrane, external ear and ear canal normal. Tympanic membrane is not erythematous, not retracted and not bulging.  Nose: No mucosal edema or rhinorrhea. Right sinus exhibits no maxillary sinus tenderness and no frontal sinus tenderness. Left sinus exhibits no maxillary sinus tenderness and no frontal sinus tenderness.  Mouth/Throat: Uvula is midline,  oropharynx is clear and moist and mucous membranes are normal.  Eyes: Conjunctivae, EOM and lids are normal. Pupils are equal, round, and reactive to light. Lids are everted and swept, no foreign bodies found.  Neck: Trachea normal and normal range of motion. Neck supple. Carotid bruit is not present. No thyroid mass and no thyromegaly present.  Cardiovascular: Normal rate, regular rhythm, S1 normal, S2 normal, normal heart sounds, intact distal pulses and normal pulses.  Exam reveals no gallop and no friction rub.   No murmur heard. Pulmonary/Chest: Effort normal and breath sounds normal. No tachypnea. No respiratory distress. She has no decreased breath sounds. She has no wheezes. She has no rhonchi. She has no rales.  Abdominal: Soft. Normal appearance and bowel sounds are normal. There is no tenderness.  Musculoskeletal:       Lumbar back: She exhibits tenderness. She exhibits normal range of motion and no bony tenderness.  Neurological: She is alert.  Skin: Skin is warm, dry and intact. No rash noted.  Psychiatric: Her speech is normal and behavior is normal. Judgment and thought content normal. Her mood appears not anxious. Cognition and memory are normal. She does not exhibit a depressed mood.          Assessment & Plan:

## 2016-05-14 ENCOUNTER — Telehealth: Payer: Self-pay | Admitting: Family Medicine

## 2016-05-14 ENCOUNTER — Encounter: Payer: Self-pay | Admitting: Family Medicine

## 2016-05-14 ENCOUNTER — Ambulatory Visit: Payer: Self-pay | Admitting: Family Medicine

## 2016-05-14 NOTE — Telephone Encounter (Signed)
Change as no show.. Needs to reschedule  Thanks Melissa!

## 2016-05-14 NOTE — Telephone Encounter (Signed)
Patient did not come in for their appointment today for cpe. Please let me know if patient needs to be contacted immediately for follow up or no follow up needed. Do you want to charge the NSF? °

## 2016-05-17 NOTE — Telephone Encounter (Signed)
Called to reschedule - mailbox not set up

## 2016-05-21 ENCOUNTER — Emergency Department: Payer: Self-pay

## 2016-05-21 ENCOUNTER — Encounter: Payer: Self-pay | Admitting: Emergency Medicine

## 2016-05-21 DIAGNOSIS — F17210 Nicotine dependence, cigarettes, uncomplicated: Secondary | ICD-10-CM | POA: Insufficient documentation

## 2016-05-21 DIAGNOSIS — W2107XA Struck by softball, initial encounter: Secondary | ICD-10-CM | POA: Insufficient documentation

## 2016-05-21 DIAGNOSIS — Y999 Unspecified external cause status: Secondary | ICD-10-CM | POA: Insufficient documentation

## 2016-05-21 DIAGNOSIS — Y9364 Activity, baseball: Secondary | ICD-10-CM | POA: Insufficient documentation

## 2016-05-21 DIAGNOSIS — Y929 Unspecified place or not applicable: Secondary | ICD-10-CM | POA: Insufficient documentation

## 2016-05-21 DIAGNOSIS — S8012XA Contusion of left lower leg, initial encounter: Secondary | ICD-10-CM | POA: Insufficient documentation

## 2016-05-21 NOTE — ED Triage Notes (Signed)
Pt struck in L shin by softball x 1 mon ago. Pt c/o increased pain over past month, worsening x 1 week. Pt having increasing difficulty ambulating.

## 2016-05-22 ENCOUNTER — Emergency Department
Admission: EM | Admit: 2016-05-22 | Discharge: 2016-05-22 | Disposition: A | Discharge status: Home / Self Care | Payer: Self-pay | Attending: Emergency Medicine | Admitting: Emergency Medicine

## 2016-05-22 DIAGNOSIS — T148XXA Other injury of unspecified body region, initial encounter: Secondary | ICD-10-CM

## 2016-05-22 NOTE — ED Provider Notes (Signed)
[facility] Emergency Department Provider Note  ____________________________________________   First MD Initiated Contact with Patient 05/22/16 0159     (approximate)  I have reviewed the triage vital signs and the nursing notes.   HISTORY  Chief Complaint Leg Pain    HPI Amy Cabrera is a 21 y.o. female who self presents to the emergency department with moderate throbbing aching to her left lower shin and ankle for the past month after she was struck on the shin by a line drive while playing softball. She has not sought medical care until today. Her pain is aching throbbing in her left leg radiating down to her left ankle worse when she walks and improved with rest. She takes 4 tablets of over-the-counter Tylenol as needed for pain with minimal relief. She came to the emergency department today out of frustration because is been a full month and her symptoms have not improved. She denies numbness or tingling. She denies fevers chills chest pain or shortness of breath. She denies all b symptoms.   Past Medical History:  Diagnosis Date  . Back pain     Patient Active Problem List   Diagnosis Date Noted  . Pyelonephritis 03/12/2016  . Tachycardia 03/12/2016  . Sepsis (HCC) 03/05/2016  . Hypokalemia 03/05/2016  . Allergic rhinitis 10/01/2014  . Low back pain 07/02/2014  . Hand dermatitis 07/02/2014  . 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 WISDOM TOOTH EXTRACTION      Prior to Admission medications   Medication Sig Start Date End Date Taking? Authorizing Provider  traMADol (ULTRAM) 50 MG tablet Take 1 tablet (50 mg total) by mouth every 6 (six) hours as needed. 03/07/16   Rebecka Apley, MD    Allergies Patient has no known allergies.  Family History  Problem Relation Age of Onset  . Cancer Maternal Grandmother        breast CA  . Heart disease Maternal Grandfather        CAD  . Cancer  Maternal Grandfather        colon CA  . Diabetes Maternal Grandfather     Social History Social History  Substance Use Topics  . Smoking status: Current Every Day Smoker    Packs/day: 0.50    Years: 0.00    Types: Cigarettes  . Smokeless tobacco: Never Used  . Alcohol use No    Review of Systems Constitutional: No fever/chills Eyes: No visual changes. ENT: No sore throat. Cardiovascular: Denies chest pain. Respiratory: Denies shortness of breath. Gastrointestinal: No abdominal pain.  No nausea, no vomiting.  No diarrhea.  No constipation. Genitourinary: Negative for dysuria. Musculoskeletal: Negative for back pain. Skin: Negative for rash. Neurological: Negative for headaches, focal weakness or numbness.  10-point ROS otherwise negative.  ____________________________________________   PHYSICAL EXAM:  VITAL SIGNS: ED Triage Vitals  Enc Vitals Group     BP 05/21/16 2201 117/74     Pulse Rate 05/21/16 2201 87     Resp 05/21/16 2201 18     Temp 05/21/16 2201 99.2 F (37.3 C)     Temp Source 05/21/16 2201 Oral     SpO2 05/21/16 2201 100 %     Weight 05/21/16 2202 220 lb (99.8 kg)     Height 05/21/16 2202 5\' 6"  (1.676 m)     Head Circumference --      Peak Flow --      Pain Score 05/21/16  2200 6     Pain Loc --      Pain Edu? --      Excl. in GC? --     Constitutional: Alert and oriented x 4 well appearing nontoxic no diaphoresis speaks in full, clear sentences Eyes: PERRL EOMI. Head: Atraumatic. Nose: No congestion/rhinnorhea. Mouth/Throat: No trismus Neck: No stridor.   Cardiovascular: Normal rate, regular rhythm. Grossly normal heart sounds.  Good peripheral circulation. Respiratory: Normal respiratory effort.  No retractions. Lungs CTAB and moving good air Gastrointestinal: Soft nontender Musculoskeletal: Somewhat tender over distal left tibia but neurovascularly intact and compartments are soft  Neurologic:  Normal speech and language. No gross focal  neurologic deficits are appreciated. Skin:  Skin is warm, dry and intact. No rash noted. Psychiatric: Mood and affect are normal. Speech and behavior are normal.    ____________________________________________    ____________________________________________   LABS (all labs ordered are listed, but only abnormal results are displayed)  Labs Reviewed - No data to display   __________________________________________  EKG   ____________________________________________  RADIOLOGY  X-ray negative for fracture dislocation ____________________________________________   PROCEDURES  Procedure(s) performed: no  Procedures  Critical Care performed: no  Observation: no ____________________________________________   INITIAL IMPRESSION / ASSESSMENT AND PLAN / ED COURSE  Pertinent labs & imaging results that were available during my care of the patient were reviewed by me and considered in my medical decision making (see chart for details).  The patient is very well-appearing and neurovascularly intact. She had a direct trauma roughly 1 month ago and has persistent symptoms. Fortunately her x-ray is negative but she likely does have a contusion of her bone. She and I had a lengthy discussion regarding her dose of Tylenol and that she is taking too much but that it is certainly not a lethal dose. I've encouraged her to continue taking Tylenol and ibuprofen as needed for pain and to follow-up with her primary care physician.      ____________________________________________   FINAL CLINICAL IMPRESSION(S) / ED DIAGNOSES  Final diagnoses:  None      NEW MEDICATIONS STARTED DURING THIS VISIT:  New Prescriptions   No medications on file     Note:  This document was prepared using Dragon voice recognition software and may include unintentional dictation errors.     Merrily Brittleifenbark, Amerah Puleo, MD 05/22/16 [phone]

## 2016-05-22 NOTE — ED Notes (Signed)

## 2016-05-22 NOTE — Discharge Instructions (Signed)
Please take 600 mg of ibuprofen or 1000 mg of Tylenol up to 3 times a day as needed for pain. Follow-up with primary care physician as needed and return to the emergency department for any concerns. Fortunately today your x-ray was negative for fracture.  It was a pleasure to take care of you today, and thank you for coming to our emergency department.  If you have any questions or concerns before leaving please ask the nurse to grab me and I'm more than happy to go through your aftercare instructions again.  If you were prescribed any opioid pain medication today such as Norco, Vicodin, Percocet, morphine, hydrocodone, or oxycodone please make sure you do not drive when you are taking this medication as it can alter your ability to drive safely.  If you have any concerns once you are home that you are not improving or are in fact getting worse before you can make it to your follow-up appointment, please do not hesitate to call 911 and come back for further evaluation.  Darel Hong MD  Results for orders placed or performed in visit on 03/12/16  CBC with Differential/Platelet  Result Value Ref Range   WBC 11.5 (H) 3.8 - 10.8 K/uL   RBC 5.17 (H) 3.80 - 5.10 MIL/uL   Hemoglobin 14.0 11.7 - 15.5 g/dL   HCT 42.8 35.0 - 45.0 %   MCV 82.8 80.0 - 100.0 fL   MCH 27.1 27.0 - 33.0 pg   MCHC 32.7 32.0 - 36.0 g/dL   RDW 13.1 11.0 - 15.0 %   Platelets 323 140 - 400 K/uL   MPV 10.4 7.5 - 12.5 fL   Neutro Abs 8,740 (H) 1,500 - 7,800 cells/uL   Lymphs Abs 1,955 850 - 3,900 cells/uL   Monocytes Absolute 690 200 - 950 cells/uL   Eosinophils Absolute 115 15 - 500 cells/uL   Basophils Absolute 0 0 - 200 cells/uL   Neutrophils Relative % 76 %   Lymphocytes Relative 17 %   Monocytes Relative 6 %   Eosinophils Relative 1 %   Basophils Relative 0 %   Smear Review Criteria for review not met   Basic metabolic panel  Result Value Ref Range   Sodium 139 135 - 146 mmol/L   Potassium 4.6 3.5 - 5.3 mmol/L     Chloride 104 98 - 110 mmol/L   CO2 24 20 - 31 mmol/L   Glucose, Bld 90 65 - 99 mg/dL   BUN 9 7 - 25 mg/dL   Creat 0.74 0.50 - 1.10 mg/dL   Calcium 10.1 8.6 - 10.2 mg/dL  POCT Urinalysis Dipstick (Automated)  Result Value Ref Range   Color, UA yellow    Clarity, UA clear    Glucose, UA negative    Bilirubin, UA negative    Ketones, UA negative    Spec Grav, UA >=1.030    Blood, UA negative    pH, UA 6.0    Protein, UA negative    Urobilinogen, UA 0.2    Nitrite, UA negative    Leukocytes, UA Negative Negative   Dg Tibia/fibula Left  Result Date: 05/21/2016 CLINICAL DATA:  Struck in left shin with softball 1 month ago, with worsening left shin pain. Initial encounter. EXAM: LEFT TIBIA AND FIBULA - 2 VIEW COMPARISON:  None. FINDINGS: There is no evidence of fracture or dislocation. The tibia and fibula appear grossly intact. There is no evidence of osseous erosion. No definite soft tissue abnormalities are characterized on  radiograph. The ankle mortise is incompletely assessed, but appears grossly unremarkable. The knee joint is incompletely assessed, but is also unremarkable in appearance. IMPRESSION: No evidence of fracture or dislocation. Electronically Signed   By: Garald Balding M.D.   On: 05/21/2016 22:15

## 2016-06-02 ENCOUNTER — Encounter: Payer: Self-pay | Admitting: Family Medicine

## 2016-06-02 NOTE — Telephone Encounter (Signed)
Pt rescheduled and canceled

## 2016-10-13 ENCOUNTER — Encounter: Payer: Self-pay | Admitting: Medical Oncology

## 2016-10-13 ENCOUNTER — Emergency Department: Payer: Self-pay

## 2016-10-13 ENCOUNTER — Emergency Department
Admission: EM | Admit: 2016-10-13 | Discharge: 2016-10-13 | Disposition: A | Discharge status: Home / Self Care | Payer: Self-pay | Attending: Emergency Medicine | Admitting: Emergency Medicine

## 2016-10-13 DIAGNOSIS — F17210 Nicotine dependence, cigarettes, uncomplicated: Secondary | ICD-10-CM | POA: Insufficient documentation

## 2016-10-13 DIAGNOSIS — N3001 Acute cystitis with hematuria: Secondary | ICD-10-CM | POA: Insufficient documentation

## 2016-10-13 LAB — URINALYSIS, COMPLETE (UACMP) WITH MICROSCOPIC
BILIRUBIN URINE: NEGATIVE
Bacteria, UA: NONE SEEN
GLUCOSE, UA: NEGATIVE mg/dL
Ketones, ur: NEGATIVE mg/dL
NITRITE: NEGATIVE
PROTEIN: NEGATIVE mg/dL
Specific Gravity, Urine: 1.005 (ref 1.005–1.030)
pH: 7 (ref 5.0–8.0)

## 2016-10-13 LAB — PREGNANCY, URINE: Preg Test, Ur: NEGATIVE

## 2016-10-13 MED ORDER — PHENAZOPYRIDINE HCL 100 MG PO TABS: 100.0000 mg | ORAL_TABLET | Freq: Three times a day (TID) | ORAL | 0 refills | Status: AC | PRN

## 2016-10-13 MED ORDER — CEPHALEXIN 500 MG PO CAPS: 500.0000 mg | ORAL_CAPSULE | Freq: Two times a day (BID) | ORAL | 0 refills | Status: AC

## 2016-10-13 NOTE — ED Provider Notes (Signed)
[facility] Emergency Department Provider Note  ____________________________________________  Time seen: Approximately 9:11 AM  I have reviewed the triage vital signs and the nursing notes.   HISTORY  Chief Complaint Urinary Tract Infection    HPI Amy Cabrera is a 21 y.o. female that presents to the emergency department for evaluation of dysuria for 2 days. Patient states that she has had some low abdominal cramping in the last week. She has chronic back pain and does not think that she has any new back pain. Patient got off of her menstrual cycle so she is not sure if there blood in her urine. No fever, chills, nausea, vomiting, urgency, frequency.   Past Medical History:  Diagnosis Date  . Back pain     Patient Active Problem List   Diagnosis Date Noted  . Pyelonephritis 03/12/2016  . Tachycardia 03/12/2016  . Sepsis (HCC) 03/05/2016  . Hypokalemia 03/05/2016  . Allergic rhinitis 10/01/2014  . Low back pain 07/02/2014  . Hand dermatitis 07/02/2014  . 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 WISDOM TOOTH EXTRACTION      Prior to Admission medications   Medication Sig Start Date End Date Taking? Authorizing Provider  cephALEXin (KEFLEX) 500 MG capsule Take 1 capsule (500 mg total) by mouth 2 (two) times daily. 10/13/16 10/23/16  Enid Derry, PA-C  phenazopyridine (PYRIDIUM) 100 MG tablet Take 1 tablet (100 mg total) by mouth 3 (three) times daily as needed for pain. 10/13/16 10/15/16  Enid Derry, PA-C  traMADol (ULTRAM) 50 MG tablet Take 1 tablet (50 mg total) by mouth every 6 (six) hours as needed. 03/07/16   Rebecka Apley, MD    Allergies Patient has no known allergies.  Family History  Problem Relation Age of Onset  . Cancer Maternal Grandmother        breast CA  . Heart disease Maternal Grandfather        CAD  . Cancer Maternal Grandfather        colon CA  . Diabetes Maternal  Grandfather     Social History Social History  Substance Use Topics  . Smoking status: Current Every Day Smoker    Packs/day: 0.50    Years: 0.00    Types: Cigarettes  . Smokeless tobacco: Never Used  . Alcohol use No     Review of Systems  Constitutional: No fever/chills Cardiovascular: No chest pain. Respiratory: No SOB. Gastrointestinal: No nausea, no vomiting.  Genitourinary: Positive for dysuria. Musculoskeletal: Negative for musculoskeletal pain. Skin: Negative for rash, abrasions, lacerations, ecchymosis.  ____________________________________________   PHYSICAL EXAM:  VITAL SIGNS: ED Triage Vitals  Enc Vitals Group     BP 10/13/16 0751 136/80     Pulse Rate 10/13/16 0751 90     Resp 10/13/16 0751 16     Temp 10/13/16 0751 97.7 F (36.5 C)     Temp Source 10/13/16 0751 Oral     SpO2 10/13/16 0751 100 %     Weight 10/13/16 0751 220 lb (99.8 kg)     Height --      Head Circumference --      Peak Flow --      Pain Score 10/13/16 0750 0     Pain Loc --      Pain Edu? --      Excl. in GC? --      Constitutional: Alert and oriented. Well appearing and in no acute distress. Eyes:  Conjunctivae are normal. PERRL. EOMI. Head: Atraumatic. ENT:      Ears:      Nose: No congestion/rhinnorhea.      Mouth/Throat: Mucous membranes are moist.  Neck: No stridor. Cardiovascular: Normal rate, regular rhythm.  Good peripheral circulation. Respiratory: Normal respiratory effort without tachypnea or retractions. Lungs CTAB. Good air entry to the bases with no decreased or absent breath sounds. Gastrointestinal: Bowel sounds 4 quadrants. Suprapubic tenderness to palpation. No guarding or rigidity. No palpable masses. No distention. No CVA tenderness. Musculoskeletal: Full range of motion to all extremities. No gross deformities appreciated. Neurologic:  Normal speech and language. No gross focal neurologic deficits are appreciated.  Skin:  Skin is warm, dry and intact.  No rash noted.   ____________________________________________   LABS (all labs ordered are listed, but only abnormal results are displayed)  Labs Reviewed  URINALYSIS, COMPLETE (UACMP) WITH MICROSCOPIC - Abnormal; Notable for the following:       Result Value   Color, Urine YELLOW (*)    APPearance CLOUDY (*)    Hgb urine dipstick MODERATE (*)    Leukocytes, UA LARGE (*)    Squamous Epithelial / LPF 6-30 (*)    All other components within normal limits  URINE CULTURE  PREGNANCY, URINE   ____________________________________________  EKG   ____________________________________________  RADIOLOGY  Ct Renal Stone Study  Result Date: 10/13/2016 CLINICAL DATA:  Dysuria for 2 days. Abdominal cramping. Lower abdominal pain and flank pain. Nausea. EXAM: CT ABDOMEN AND PELVIS WITHOUT CONTRAST TECHNIQUE: Multidetector CT imaging of the abdomen and pelvis was performed following the standard protocol without IV contrast. COMPARISON:  None. FINDINGS: Lower chest: Minimal dependent atelectasis in the lung bases. No pleural effusion. Hepatobiliary: No focal liver abnormality is seen. No gallstones, gallbladder wall thickening, or biliary dilatation. Pancreas: Unremarkable. Spleen: Borderline splenomegaly. Adrenals/Urinary Tract: Unremarkable adrenal glands. No evidence of renal mass, calculi, or hydronephrosis. No ureteral calculi or ureteral dilatation. 2 mm hyperdense focus in the dependent portion of the bladder on the right (series 2, image 78 and series 6, image 85). Otherwise unremarkable appearance of the bladder. Stomach/Bowel: The stomach is within normal limits. No evidence of bowel obstruction or inflammation. Unremarkable appendix. Vascular/Lymphatic: Normal caliber of the abdominal aorta. No enlarged lymph nodes. Reproductive: Unremarkable uterus and ovaries. Other: No intraperitoneal free fluid. No abdominal wall mass or hernia. Musculoskeletal: No acute osseous abnormality or suspicious  osseous lesion. IMPRESSION: 1. Punctate stone/gravel or other debris in the bladder. 2. No renal or ureteral calculi or hydronephrosis. Electronically Signed   By: Sebastian Ache M.D.   On: 10/13/2016 09:56    ____________________________________________    PROCEDURES  Procedure(s) performed:    Procedures    Medications - No data to display   ____________________________________________   INITIAL IMPRESSION / ASSESSMENT AND PLAN / ED COURSE  Pertinent labs & imaging results that were available during my care of the patient were reviewed by me and considered in my medical decision making (see chart for details).  Review of the [place] CSRS was performed in accordance of the [facility] prior to dispensing any controlled drugs.     Patient's diagnosis is consistent with urinary tract infection. Vital signs and exam are reassuring. No kidney stones on CT. Patient will be discharged home with prescriptions for keflex and pyridium. Patient is to follow up with PCP as directed. Patient is given ED precautions to return to the ED for any worsening or new symptoms.     ____________________________________________  FINAL  CLINICAL IMPRESSION(S) / ED DIAGNOSES  Final diagnoses:  Acute cystitis with hematuria      NEW MEDICATIONS STARTED DURING THIS VISIT:  Discharge Medication List as of 10/13/2016 10:30 AM    START taking these medications   Details  cephALEXin (KEFLEX) 500 MG capsule Take 1 capsule (500 mg total) by mouth 2 (two) times daily., Starting Wed 10/13/2016, Until Sat 10/23/2016, Print            This chart was dictated using voice recognition software/Dragon. Despite best efforts to proofread, errors can occur which can change the meaning. Any change was purely unintentional.    Enid Derry, PA-C 10/13/16 1213    Pershing Proud Myra Rude, MD 10/13/16 [id number]

## 2016-10-13 NOTE — ED Triage Notes (Signed)
Pt reports dysuria x 2 days. Hx of uti and feel the same.

## 2016-10-13 NOTE — ED Notes (Signed)
See triage note  Presents with some dysuria and abd cramping which started about 1 week

## 2016-10-16 LAB — URINE CULTURE

## 2016-10-17 NOTE — Progress Notes (Signed)
ED Antimicrobial Stewardship Positive Culture Follow Up   Amy Cabrera is an 21 y.o. female who presented to [facility] on 10/13/2016 with a chief complaint of  Chief Complaint  Patient presents with  . Urinary Tract Infection    Recent Results (from the past 720 hour(s))  Urine culture     Status: Abnormal   Collection Time: 10/13/16  8:04 AM  Result Value Ref Range Status   Specimen Description URINE, RANDOM  Final   Special Requests NONE  Final   Culture >=100,000 COLONIES/mL STAPHYLOCOCCUS SAPROPHYTICUS (A)  Final   Report Status 10/16/2016 FINAL  Final   Organism ID, Bacteria STAPHYLOCOCCUS SAPROPHYTICUS (A)  Final      Susceptibility   Staphylococcus saprophyticus - MIC*    CIPROFLOXACIN <=0.5 SENSITIVE Sensitive     GENTAMICIN <=0.5 SENSITIVE Sensitive     NITROFURANTOIN <=16 SENSITIVE Sensitive     OXACILLIN 2 RESISTANT Resistant     TETRACYCLINE <=1 SENSITIVE Sensitive     VANCOMYCIN 1 SENSITIVE Sensitive     TRIMETH/SULFA <=10 SENSITIVE Sensitive     CLINDAMYCIN <=0.25 SENSITIVE Sensitive     RIFAMPIN <=0.5 SENSITIVE Sensitive     Inducible Clindamycin NEGATIVE Sensitive     * >=100,000 COLONIES/mL STAPHYLOCOCCUS SAPROPHYTICUS     Treated with cephalexin, organism resistant to prescribed antimicrobial (oxacillin-resistant)  Patient discharged originally without antimicrobial agent and treatment is now indicated  New antibiotic prescription: Macrobid 100 mg BID x 10 d, no RF  ED Provider: Dahlia Bailiff D 10/17/2016, 10:21 AM Infectious Diseases Pharmacist Phone# [phone]

## 2016-10-17 NOTE — Progress Notes (Signed)
ED Antimicrobial Stewardship Positive Culture Follow Up   Amy Cabrera is an 21 y.o. female who presented to [facility] on 10/13/2016 with a chief complaint of  Chief Complaint  Patient presents with  . Urinary Tract Infection    Recent Results (from the past 720 hour(s))  Urine culture     Status: Abnormal   Collection Time: 10/13/16  8:04 AM  Result Value Ref Range Status   Specimen Description URINE, RANDOM  Final   Special Requests NONE  Final   Culture >=100,000 COLONIES/mL STAPHYLOCOCCUS SAPROPHYTICUS (A)  Final   Report Status 10/16/2016 FINAL  Final   Organism ID, Bacteria STAPHYLOCOCCUS SAPROPHYTICUS (A)  Final      Susceptibility   Staphylococcus saprophyticus - MIC*    CIPROFLOXACIN <=0.5 SENSITIVE Sensitive     GENTAMICIN <=0.5 SENSITIVE Sensitive     NITROFURANTOIN <=16 SENSITIVE Sensitive     OXACILLIN 2 RESISTANT Resistant     TETRACYCLINE <=1 SENSITIVE Sensitive     VANCOMYCIN 1 SENSITIVE Sensitive     TRIMETH/SULFA <=10 SENSITIVE Sensitive     CLINDAMYCIN <=0.25 SENSITIVE Sensitive     RIFAMPIN <=0.5 SENSITIVE Sensitive     Inducible Clindamycin NEGATIVE Sensitive     * >=100,000 COLONIES/mL STAPHYLOCOCCUS SAPROPHYTICUS     Treated with cephalexin, organism resistant to prescribed antimicrobial (oxacillin-resistant)  Patient discharged originally without antimicrobial agent and treatment is now indicated  New antibiotic prescription: Macrobid 100 mg BID x 10 d, no RF  Phoned prescription to [facility] on [address]  ED Provider: Dahlia Bailiff D 10/17/2016, 3:03 PM Infectious Diseases Pharmacist Phone# [phone]

## 2016-12-31 ENCOUNTER — Emergency Department
Admission: EM | Admit: 2016-12-31 | Discharge: 2016-12-31 | Disposition: A | Discharge status: Home / Self Care | Payer: Medicaid Other | Attending: Emergency Medicine | Admitting: Emergency Medicine

## 2016-12-31 ENCOUNTER — Other Ambulatory Visit: Payer: Self-pay

## 2016-12-31 DIAGNOSIS — O99611 Diseases of the digestive system complicating pregnancy, first trimester: Secondary | ICD-10-CM | POA: Insufficient documentation

## 2016-12-31 DIAGNOSIS — N3000 Acute cystitis without hematuria: Secondary | ICD-10-CM | POA: Insufficient documentation

## 2016-12-31 DIAGNOSIS — Z3491 Encounter for supervision of normal pregnancy, unspecified, first trimester: Secondary | ICD-10-CM

## 2016-12-31 DIAGNOSIS — K529 Noninfective gastroenteritis and colitis, unspecified: Secondary | ICD-10-CM

## 2016-12-31 DIAGNOSIS — Z3A01 Less than 8 weeks gestation of pregnancy: Secondary | ICD-10-CM | POA: Insufficient documentation

## 2016-12-31 DIAGNOSIS — R112 Nausea with vomiting, unspecified: Secondary | ICD-10-CM | POA: Diagnosis not present

## 2016-12-31 DIAGNOSIS — F17210 Nicotine dependence, cigarettes, uncomplicated: Secondary | ICD-10-CM | POA: Insufficient documentation

## 2016-12-31 LAB — CBC
HCT: 38.7 % (ref 35.0–47.0)
Hemoglobin: 13 g/dL (ref 12.0–16.0)
MCH: 27.5 pg (ref 26.0–34.0)
MCHC: 33.5 g/dL (ref 32.0–36.0)
MCV: 82.2 fL (ref 80.0–100.0)
PLATELETS: 167 10*3/uL (ref 150–440)
RBC: 4.71 MIL/uL (ref 3.80–5.20)
RDW: 12.6 % (ref 11.5–14.5)
WBC: 7.7 10*3/uL (ref 3.6–11.0)

## 2016-12-31 LAB — COMPREHENSIVE METABOLIC PANEL
ALT: 19 U/L (ref 14–54)
AST: 20 U/L (ref 15–41)
Albumin: 4.4 g/dL (ref 3.5–5.0)
Alkaline Phosphatase: 79 U/L (ref 38–126)
Anion gap: 9 (ref 5–15)
BILIRUBIN TOTAL: 0.6 mg/dL (ref 0.3–1.2)
BUN: 7 mg/dL (ref 6–20)
CHLORIDE: 104 mmol/L (ref 101–111)
CO2: 21 mmol/L — ABNORMAL LOW (ref 22–32)
CREATININE: 0.49 mg/dL (ref 0.44–1.00)
Calcium: 9.7 mg/dL (ref 8.9–10.3)
GFR calc Af Amer: >60 mL/min (ref >60)
GLUCOSE: 99 mg/dL (ref 65–99)
Potassium: 3.3 mmol/L — ABNORMAL LOW (ref 3.5–5.1)
Sodium: 134 mmol/L — ABNORMAL LOW (ref 135–145)
Total Protein: 7.7 g/dL (ref 6.5–8.1)

## 2016-12-31 LAB — URINALYSIS, COMPLETE (UACMP) WITH MICROSCOPIC
Bilirubin Urine: NEGATIVE
Glucose, UA: NEGATIVE mg/dL
Hgb urine dipstick: NEGATIVE
Ketones, ur: 80 mg/dL — AB
Nitrite: NEGATIVE
Protein, ur: 30 mg/dL — AB
Specific Gravity, Urine: 1.027 (ref 1.005–1.030)
pH: 5 (ref 5.0–8.0)

## 2016-12-31 LAB — LIPASE, BLOOD: LIPASE: 25 U/L (ref 11–51)

## 2016-12-31 LAB — HCG, QUANTITATIVE, PREGNANCY: hCG, Beta Chain, Quant, S: 112099 m[IU]/mL — ABNORMAL HIGH

## 2016-12-31 MED ORDER — NITROFURANTOIN MONOHYD MACRO 100 MG PO CAPS: 100.0000 mg | ORAL_CAPSULE | Freq: Two times a day (BID) | ORAL | 0 refills | Status: DC

## 2016-12-31 MED ORDER — PROMETHAZINE HCL 25 MG PO TABS: 25.0000 mg | ORAL_TABLET | Freq: Four times a day (QID) | ORAL | 0 refills | Status: DC | PRN

## 2016-12-31 MED ORDER — ONDANSETRON 4 MG PO TBDP
4.0000 mg | ORAL_TABLET | Freq: Once | ORAL | Status: AC
  Administered 2016-12-31: 4 mg via ORAL
  Filled 2016-12-31: qty 1

## 2016-12-31 NOTE — ED Triage Notes (Signed)
Pt reports n/v/d. Pt also reports she believes she is [redacted] weeks pregnant. Has not been to an obgyn yet, had test done at health department

## 2016-12-31 NOTE — ED Provider Notes (Signed)
[facility] Emergency Department Provider Note       Time seen: ----------------------------------------- 4:37 PM on 12/31/2016 -----------------------------------------   I have reviewed the triage vital signs and the nursing notes.  HISTORY   Chief Complaint Emesis and Diarrhea    HPI Amy Cabrera is a 21 y.o. female with a history of back pain, pyelonephritis who presents to the ED for nausea, vomiting diarrhea.  Patient also believes she is [redacted] weeks pregnant.  She has not been to the OB/GYN doctor yet.  She had a test done at the health department she states.  Pain is 6 out of 10 and is generalized.  Nothing makes it better or worse.  Past Medical History:  Diagnosis Date  . Back pain     Patient Active Problem List   Diagnosis Date Noted  . Pyelonephritis 03/12/2016  . Tachycardia 03/12/2016  . Sepsis (HCC) 03/05/2016  . Hypokalemia 03/05/2016  . Allergic rhinitis 10/01/2014  . Low back pain 07/02/2014  . Hand dermatitis 07/02/2014  . 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 WISDOM TOOTH EXTRACTION      Allergies Patient has no known allergies.  Social History Social History   Tobacco Use  . Smoking status: Current Every Day Smoker    Packs/day: 0.50    Years: 0.00    Pack years: 0.00    Types: Cigarettes  . Smokeless tobacco: Never Used  Substance Use Topics  . Alcohol use: No    Alcohol/week: 0.0 oz  . Drug use: No    Review of Systems Constitutional: Negative for fever. Cardiovascular: Negative for chest pain. Respiratory: Negative for shortness of breath. Gastrointestinal: Positive for abdominal pain, vomiting and diarrhea Genitourinary: Negative for dysuria. Musculoskeletal: Positive for myalgias Skin: Negative for rash. Neurological: Negative for headaches, focal weakness or numbness.  All systems negative/normal/unremarkable except as stated in the  HPI  ____________________________________________   PHYSICAL EXAM:  VITAL SIGNS: ED Triage Vitals  Enc Vitals Group     BP 12/31/16 1605 123/86     Pulse Rate 12/31/16 1605 95     Resp 12/31/16 1605 16     Temp 12/31/16 1605 99.2 F (37.3 C)     Temp Source 12/31/16 1605 Oral     SpO2 12/31/16 1605 100 %     Weight --      Height 12/31/16 1605 5\' 5"  (1.651 m)     Head Circumference --      Peak Flow --      Pain Score 12/31/16 1625 6     Pain Loc --      Pain Edu? --      Excl. in GC? --    Constitutional: Alert and oriented. Well appearing and in no distress. Eyes: Conjunctivae are normal. Normal extraocular movements. ENT   Head: Normocephalic and atraumatic.   Nose: No congestion/rhinnorhea.   Mouth/Throat: Mucous membranes are moist.   Neck: No stridor. Cardiovascular: Normal rate, regular rhythm. No murmurs, rubs, or gallops. Respiratory: Normal respiratory effort without tachypnea nor retractions. Breath sounds are clear and equal bilaterally. No wheezes/rales/rhonchi. Gastrointestinal: Soft and nontender. Normal bowel sounds Musculoskeletal: Nontender with normal range of motion in extremities. No lower extremity tenderness nor edema. Neurologic:  Normal speech and language. No gross focal neurologic deficits are appreciated.  Skin:  Skin is warm, dry and intact. No rash noted. Psychiatric: Mood and affect are normal. Speech and behavior are normal.  ____________________________________________  ED COURSE:  As part of my medical decision making, I reviewed the following data within the electronic MEDICAL RECORD NUMBER History obtained from family if available, nursing notes, old chart and ekg, as well as notes from prior ED visits. Patient presented for nausea, vomiting and diarrhea in the first trimester pregnancy, we will assess with labs and imaging as indicated at this time.   Procedures ____________________________________________   LABS (pertinent  positives/negatives)  Labs Reviewed  COMPREHENSIVE METABOLIC PANEL - Abnormal; Notable for the following components:      Result Value   Sodium 134 (*)    Potassium 3.3 (*)    CO2 21 (*)    All other components within normal limits  URINALYSIS, COMPLETE (UACMP) WITH MICROSCOPIC - Abnormal; Notable for the following components:   Color, Urine AMBER (*)    APPearance CLOUDY (*)    Ketones, ur 80 (*)    Protein, ur 30 (*)    Leukocytes, UA MODERATE (*)    Bacteria, UA RARE (*)    Squamous Epithelial / LPF TOO NUMEROUS TO COUNT (*)    All other components within normal limits  HCG, QUANTITATIVE, PREGNANCY - Abnormal; Notable for the following components:   hCG, Beta Chain, Quant, S 112,099 (*)    All other components within normal limits  LIPASE, BLOOD  CBC  POC URINE PREG, ED   ____________________________________________  DIFFERENTIAL DIAGNOSIS   Normal pregnancy, gastroenteritis, dehydration, electrolyte abnormality  FINAL ASSESSMENT AND PLAN  Gastroenteritis, first trimester pregnancy, possible UTI   Plan: Patient had presented for nausea, vomiting and diarrhea in the first trimester pregnancy. Patient's labs were grossly unremarkable.  He does appear to be in the first trimester pregnancy and likely has gastroenteritis.  Will prescribe antiemetics for her and Macrobid.  She possibly has UTI so we will send a urine otherwise she is stable for outpatient follow-up.   Emily FilbertWilliams, Jonathan E, MD   Note: This note was generated in part or whole with voice recognition software. Voice recognition is usually quite accurate but there are transcription errors that can and very often do occur. I apologize for any typographical errors that were not detected and corrected.     Emily FilbertWilliams, Jonathan E, MD 12/31/16 1800

## 2016-12-31 NOTE — ED Notes (Signed)
Pt ambulatory upon discharge. Verbalized understanding of discharge instructions, follow-up care and prescriptions. VSS. Skin warm and dry. A&O x4.  

## 2017-01-02 LAB — URINE CULTURE: Special Requests: NORMAL

## 2017-02-02 ENCOUNTER — Encounter: Payer: Self-pay | Admitting: Certified Nurse Midwife

## 2017-02-02 ENCOUNTER — Ambulatory Visit (INDEPENDENT_AMBULATORY_CARE_PROVIDER_SITE_OTHER): Payer: Medicaid Other | Admitting: Certified Nurse Midwife

## 2017-02-02 VITALS — BP 111/64 | HR 93 | Wt 207.4 lb

## 2017-02-02 DIAGNOSIS — Z3401 Encounter for supervision of normal first pregnancy, first trimester: Secondary | ICD-10-CM

## 2017-02-02 LAB — POCT URINALYSIS DIPSTICK
Bilirubin, UA: NEGATIVE
Blood, UA: NEGATIVE
Glucose, UA: NEGATIVE
Ketones, UA: NEGATIVE
Leukocytes, UA: NEGATIVE
NITRITE UA: NEGATIVE
PROTEIN UA: NEGATIVE
SPEC GRAV UA: 1.01 (ref 1.010–1.025)
UROBILINOGEN UA: 0.2 U/dL
pH, UA: 7 (ref 5.0–8.0)

## 2017-02-02 NOTE — Progress Notes (Signed)
NEW OB HISTORY AND PHYSICAL  SUBJECTIVE:       Amy Cabrera is a 22 y.o. G10P0000 female, Patient's last menstrual period was 11/04/2016 (exact date)., Estimated Date of Delivery: 08/11/17, [redacted]w[redacted]d, presents today for establishment of Prenatal Care. She has no unusual complaints. She had a confirmation at the health department.    Gynecologic History Patient's last menstrual period was 11/04/2016 (exact date). Normal Contraception: none Last Pap: n/a. Not had one  Obstetric History OB History  Gravida Para Term Preterm AB Living  1 0 0 0 0 0  SAB TAB Ectopic Multiple Live Births  0 0 0 0 0    # Outcome Date GA Lbr Len/2nd Weight Sex Delivery Anes PTL Lv  1 Current               Past Medical History:  Diagnosis Date  . Back pain     Past Surgical History:  Procedure Laterality Date  . WISDOM TOOTH EXTRACTION      Current Outpatient Medications on File Prior to Visit  Medication Sig Dispense Refill  . Prenatal Vit-Fe Fumarate-FA (PRENATAL MULTIVITAMIN) TABS tablet Take 1 tablet by mouth daily at 12 noon.     No current facility-administered medications on file prior to visit.     No Known Allergies  Social History   Socioeconomic History  . Marital status: Single    Spouse name: Not on file  . Number of children: Not on file  . Years of education: Not on file  . Highest education level: Not on file  Social Needs  . Financial resource strain: Not on file  . Food insecurity - worry: Not on file  . Food insecurity - inability: Not on file  . Transportation needs - medical: Not on file  . Transportation needs - non-medical: Not on file  Occupational History  . Not on file  Tobacco Use  . Smoking status: Current Every Day Smoker    Packs/day: 0.50    Years: 0.00    Pack years: 0.00    Types: Cigarettes  . Smokeless tobacco: Never Used  Substance and Sexual Activity  . Alcohol use: No    Alcohol/week: 0.0 oz  . Drug use: No  . Sexual activity: Yes   Birth control/protection: None  Other Topics Concern  . Not on file  Social History Narrative  . Not on file    Family History  Problem Relation Age of Onset  . Cancer Maternal Grandmother        breast CA  . Heart disease Maternal Grandfather        CAD  . Cancer Maternal Grandfather        colon CA  . Diabetes Maternal Grandfather     The following portions of the patient's history were reviewed and updated as appropriate: allergies, current medications, past OB history, past medical history, past surgical history, past family history, past social history, and problem list.    OBJECTIVE: Initial Physical Exam (New OB) Body mass index is 34.52 kg/m.   GENERAL APPEARANCE: alert, well appearing, in no apparent distress, oriented to person, place and time, overweight HEAD: normocephalic, atraumatic MOUTH: mucous membranes moist, pharynx normal without lesions THYROID: no thyromegaly or masses present BREASTS: no masses noted, no significant tenderness, no palpable axillary nodes, no skin changes LUNGS: clear to auscultation, no wheezes, rales or rhonchi, symmetric air entry HEART: regular rate and rhythm, no murmurs ABDOMEN: soft, nontender, nondistended, no abnormal masses, no epigastric pain EXTREMITIES:  no redness or tenderness in the calves or thighs, no edema, no limitation in range of motion, intact peripheral pulses SKIN: normal coloration and turgor, no rashes LYMPH NODES: no adenopathy palpable NEUROLOGIC: alert, oriented, normal speech, no focal findings or movement disorder noted  PELVIC EXAM EXTERNAL GENITALIA: normal appearing vulva with no masses, tenderness or lesions VAGINA: no abnormal discharge or lesions CERVIX: no lesions or cervical motion tenderness, contact bleeding UTERUS: gravid ADNEXA: no masses palpable and nontender OB EXAM PELVIMETRY: appears adequate RECTUM: exam not indicated  ASSESSMENT: Normal pregnancy  PLAN: New OB counseling: The  patient has been given an overview regarding routine prenatal care.Recommendations regarding diet, weight gain, and exercise in pregnancy were given. Prenatal testing, optional genetic testing, and ultrasound use in pregnancy were reviewed. Information on genetic testing given. Benefits of Breast Feeding were discussed. Pamphlet on smoking in pregnancy given. Encouraged cessation. The patient is encouraged to consider nursing her baby post partum.   Doreene BurkeAnnie Tawnya Pujol, CNM

## 2017-02-02 NOTE — Patient Instructions (Signed)

## 2017-02-03 LAB — CBC WITH DIFFERENTIAL
BASOS ABS: 0 10*3/uL (ref 0.0–0.2)
Basos: 0 %
EOS (ABSOLUTE): 0 10*3/uL (ref 0.0–0.4)
EOS: 0 %
HEMATOCRIT: 39.8 % (ref 34.0–46.6)
HEMOGLOBIN: 13.3 g/dL (ref 11.1–15.9)
IMMATURE GRANS (ABS): 0 10*3/uL (ref 0.0–0.1)
IMMATURE GRANULOCYTES: 0 %
LYMPHS ABS: 1.4 10*3/uL (ref 0.7–3.1)
LYMPHS: 14 %
MCH: 27.8 pg (ref 26.6–33.0)
MCHC: 33.4 g/dL (ref 31.5–35.7)
MCV: 83 fL (ref 79–97)
MONOCYTES: 5 %
Monocytes Absolute: 0.5 10*3/uL (ref 0.1–0.9)
NEUTROS ABS: 7.9 10*3/uL — AB (ref 1.4–7.0)
Neutrophils: 81 %
RBC: 4.79 x10E6/uL (ref 3.77–5.28)
RDW: 13.7 % (ref 12.3–15.4)
WBC: 9.9 10*3/uL (ref 3.4–10.8)

## 2017-02-03 LAB — VARICELLA ZOSTER ANTIBODY, IGG: Varicella zoster IgG: 930 index (ref >165)

## 2017-02-03 LAB — URINALYSIS, ROUTINE W REFLEX MICROSCOPIC
BILIRUBIN UA: NEGATIVE
GLUCOSE, UA: NEGATIVE
KETONES UA: NEGATIVE
LEUKOCYTES UA: NEGATIVE
Nitrite, UA: NEGATIVE
PROTEIN UA: NEGATIVE
RBC UA: NEGATIVE
SPEC GRAV UA: 1.019 (ref 1.005–1.030)
Urobilinogen, Ur: 1 mg/dL (ref 0.2–1.0)
pH, UA: 7 (ref 5.0–7.5)

## 2017-02-03 LAB — RPR: RPR Ser Ql: NONREACTIVE

## 2017-02-03 LAB — HEPATITIS B SURFACE ANTIGEN: HEP B S AG: NEGATIVE

## 2017-02-03 LAB — ABO AND RH: Rh Factor: POSITIVE

## 2017-02-03 LAB — HIV ANTIBODY (ROUTINE TESTING W REFLEX): HIV SCREEN 4TH GENERATION: NONREACTIVE

## 2017-02-03 LAB — RUBELLA SCREEN: RUBELLA: 1.51 {index} (ref >0.99)

## 2017-02-03 LAB — ANTIBODY SCREEN: ANTIBODY SCREEN: NEGATIVE

## 2017-02-04 LAB — URINE CULTURE: ORGANISM ID, BACTERIA: NO GROWTH

## 2017-02-04 LAB — GC/CHLAMYDIA PROBE AMP
Chlamydia trachomatis, NAA: NEGATIVE
Neisseria gonorrhoeae by PCR: NEGATIVE

## 2017-02-07 LAB — MONITOR DRUG PROFILE 14(MW)
Amphetamine Scrn, Ur: NEGATIVE ng/mL
BARBITURATE SCREEN URINE: NEGATIVE ng/mL
BENZODIAZEPINE SCREEN, URINE: NEGATIVE ng/mL
Buprenorphine, Urine: NEGATIVE ng/mL
Cocaine (Metab) Scrn, Ur: NEGATIVE ng/mL
Creatinine(Crt), U: 173.1 mg/dL (ref 20.0–300.0)
FENTANYL, URINE: NEGATIVE pg/mL
Meperidine Screen, Urine: NEGATIVE ng/mL
Methadone Screen, Urine: NEGATIVE ng/mL
OPIATE SCREEN URINE: NEGATIVE ng/mL
OXYCODONE+OXYMORPHONE UR QL SCN: NEGATIVE ng/mL
Ph of Urine: 6.9 (ref 4.5–8.9)
Phencyclidine Qn, Ur: NEGATIVE ng/mL
Propoxyphene Scrn, Ur: NEGATIVE ng/mL
SPECIFIC GRAVITY: 1.022
TRAMADOL SCREEN, URINE: NEGATIVE ng/mL

## 2017-02-07 LAB — CANNABINOID (GC/MS), URINE
Cannabinoid: POSITIVE — AB
Carboxy THC (GC/MS): 42 ng/mL

## 2017-02-07 LAB — NICOTINE SCREEN, URINE: Cotinine Ql Scrn, Ur: POSITIVE ng/mL — AB

## 2017-02-15 ENCOUNTER — Encounter: Payer: Self-pay | Admitting: Certified Nurse Midwife

## 2017-02-15 LAB — PAP IG, CT-NG, RFX HPV ASCU
Chlamydia, Nuc. Acid Amp: NEGATIVE
Gonococcus by Nucleic Acid Amp: NEGATIVE
PAP Smear Comment: 0

## 2017-02-15 LAB — HPV DNA PROBE HIGH RISK, AMPLIFIED: HPV, HIGH-RISK: POSITIVE — AB

## 2017-02-20 ENCOUNTER — Encounter: Payer: Self-pay | Admitting: Certified Nurse Midwife

## 2017-03-03 ENCOUNTER — Encounter: Payer: Self-pay | Admitting: Certified Nurse Midwife

## 2017-03-03 ENCOUNTER — Ambulatory Visit (INDEPENDENT_AMBULATORY_CARE_PROVIDER_SITE_OTHER): Payer: Medicaid Other | Admitting: Certified Nurse Midwife

## 2017-03-03 VITALS — BP 114/67 | HR 84 | Wt 202.9 lb

## 2017-03-03 DIAGNOSIS — Z3482 Encounter for supervision of other normal pregnancy, second trimester: Secondary | ICD-10-CM

## 2017-03-03 DIAGNOSIS — Z6833 Body mass index (BMI) 33.0-33.9, adult: Secondary | ICD-10-CM

## 2017-03-03 DIAGNOSIS — Z131 Encounter for screening for diabetes mellitus: Secondary | ICD-10-CM

## 2017-03-03 DIAGNOSIS — Z803 Family history of malignant neoplasm of breast: Secondary | ICD-10-CM | POA: Insufficient documentation

## 2017-03-03 DIAGNOSIS — Z3689 Encounter for other specified antenatal screening: Secondary | ICD-10-CM

## 2017-03-03 DIAGNOSIS — Z833 Family history of diabetes mellitus: Secondary | ICD-10-CM

## 2017-03-03 DIAGNOSIS — Z23 Encounter for immunization: Secondary | ICD-10-CM

## 2017-03-03 LAB — POCT URINALYSIS DIPSTICK
BILIRUBIN UA: NEGATIVE
Glucose, UA: NEGATIVE
Leukocytes, UA: NEGATIVE
Nitrite, UA: NEGATIVE
Odor: NEGATIVE
PH UA: 6.5 (ref 5.0–8.0)
Protein, UA: NEGATIVE
RBC UA: NEGATIVE
UROBILINOGEN UA: 0.2 U/dL

## 2017-03-03 NOTE — Patient Instructions (Signed)
Round Ligament Pain The round ligament is a cord of muscle and tissue that helps to support the uterus. It can become a source of pain during pregnancy if it becomes stretched or twisted as the baby grows. The pain usually begins in the second trimester of pregnancy, and it can come and go until the baby is delivered. It is not a serious problem, and it does not cause harm to the baby. Round ligament pain is usually a short, sharp, and pinching pain, but it can also be a dull, lingering, and aching pain. The pain is felt in the lower side of the abdomen or in the groin. It usually starts deep in the groin and moves up to the outside of the hip area. Pain can occur with:  A sudden change in position.  Rolling over in bed.  Coughing or sneezing.  Physical activity.  Follow these instructions at home: Watch your condition for any changes. Take these steps to help with your pain:  When the pain starts, relax. Then try: ? Sitting down. ? Flexing your knees up to your abdomen. ? Lying on your side with one pillow under your abdomen and another pillow between your legs. ? Sitting in a warm bath for 15-20 minutes or until the pain goes away.  Take over-the-counter and prescription medicines only as told by your health care provider.  Move slowly when you sit and stand.  Avoid long walks if they cause pain.  Stop or lessen your physical activities if they cause pain.  Contact a health care provider if:  Your pain does not go away with treatment.  You feel pain in your back that you did not have before.  Your medicine is not helping. Get help right away if:  You develop a fever or chills.  You develop uterine contractions.  You develop vaginal bleeding.  You develop nausea or vomiting.  You develop diarrhea.  You have pain when you urinate. This information is not intended to replace advice given to you by your health care provider. Make sure you discuss any questions you have  with your health care provider. Document Released: 10/07/2007 Document Revised: 06/05/2015 Document Reviewed: 03/06/2014 Elsevier Interactive Patient Education  2018 [employer]. Back Pain in Pregnancy Back pain during pregnancy is common. Back pain may be caused by several factors that are related to changes during your pregnancy. Follow these instructions at home: Managing pain, stiffness, and swelling  If directed, apply ice for sudden (acute) back pain. ? Put ice in a plastic bag. ? Place a towel between your skin and the bag. ? Leave the ice on for 20 minutes, 2-3 times per day.  If directed, apply heat to the affected area before you exercise: ? Place a towel between your skin and the heat pack or heating pad. ? Leave the heat on for 20-30 minutes. ? Remove the heat if your skin turns bright red. This is especially important if you are unable to feel pain, heat, or cold. You may have a greater risk of getting burned. Activity  Exercise as told by your health care provider. Exercising is the best way to prevent or manage back pain.  Listen to your body when lifting. If lifting hurts, ask for help or bend your knees. This uses your leg muscles instead of your back muscles.  Squat down when picking up something from the floor. Do not bend over.  Only use bed rest as told by your health care provider. Bed  rest should only be used for the most severe episodes of back pain. Standing, Sitting, and Lying Down  Do not stand in one place for long periods of time.  Use good posture when sitting. Make sure your head rests over your shoulders and is not hanging forward. Use a pillow on your lower back if necessary.  Try sleeping on your side, preferably the left side, with a pillow or two between your legs. If you are sore after a night's rest, your bed may be too soft. A firm mattress may provide more support for your back during pregnancy. General instructions  Do not wear high  heels.  Eat a healthy diet. Try to gain weight within your health care provider's recommendations.  Use a maternity girdle, elastic sling, or back brace as told by your health care provider.  Take over-the-counter and prescription medicines only as told by your health care provider.  Keep all follow-up visits as told by your health care provider. This is important. This includes any visits with any specialists, such as a physical therapist. Contact a health care provider if:  Your back pain interferes with your daily activities.  You have increasing pain in other parts of your body. Get help right away if:  You develop numbness, tingling, weakness, or problems with the use of your arms or legs.  You develop severe back pain that is not controlled with medicine.  You have a sudden change in bowel or bladder control.  You develop shortness of breath, dizziness, or you faint.  You develop nausea, vomiting, or sweating.  You have back pain that is a rhythmic, cramping pain similar to labor pains. Labor pain is usually 1-2 minutes apart, lasts for about 1 minute, and involves a bearing down feeling or pressure in your pelvis.  You have back pain and your water breaks or you have vaginal bleeding.  You have back pain or numbness that travels down your leg.  Your back pain developed after you fell.  You develop pain on one side of your back.  You see blood in your urine.  You develop skin blisters in the area of your back pain. This information is not intended to replace advice given to you by your health care provider. Make sure you discuss any questions you have with your health care provider. Document Released: 04/07/2005 Document Revised: 06/05/2015 Document Reviewed: 09/11/2014 Elsevier Interactive Patient Education  2018 [employer]. Common Medications Safe in Pregnancy  Acne:      Constipation:  Benzoyl Peroxide     Colace  Clindamycin      Dulcolax  Suppository  Topica Erythromycin     Fibercon  Salicylic Acid      Metamucil         Miralax AVOID:        Senakot   Accutane    Cough:  Retin-A       Cough Drops  Tetracycline      Phenergan w/ Codeine if Rx  Minocycline      Robitussin (Plain & DM)  Antibiotics:     Crabs/Lice:  Ceclor       RID  Cephalosporins    AVOID:  E-Mycins      Kwell  Keflex  Macrobid/Macrodantin   Diarrhea:  Penicillin      Kao-Pectate  Zithromax      Imodium AD         PUSH FLUIDS AVOID:       Cipro  Fever:  Tetracycline      Tylenol (Regular or Extra  Minocycline       Strength)  Levaquin      Extra Strength-Do not          Exceed 8 tabs/24 hrs Caffeine:        <264m/day (equiv. To 1 cup of coffee or  approx. 3 12 oz sodas)         Gas: Cold/Hayfever:       Gas-X  Benadryl      Mylicon  Claritin       Phazyme  **Claritin-D        Chlor-Trimeton    Headaches:  Dimetapp      ASA-Free Excedrin  Drixoral-Non-Drowsy     Cold Compress  Mucinex (Guaifenasin)     Tylenol (Regular or Extra  Sudafed/Sudafed-12 Hour     Strength)  **Sudafed PE Pseudoephedrine   Tylenol Cold & Sinus     Vicks Vapor Rub  Zyrtec  **AVOID if Problems With Blood Pressure         Heartburn: Avoid lying down for at least 1 hour after meals  Aciphex      Maalox     Rash:  Milk of Magnesia     Benadryl    Mylanta       1% Hydrocortisone Cream  Pepcid  Pepcid Complete   Sleep Aids:  Prevacid      Ambien   Prilosec       Benadryl  Rolaids       Chamomile Tea  Tums (Limit 4/day)     Unisom  Zantac       Tylenol PM         Warm milk-add vanilla or  Hemorrhoids:       Sugar for taste  Anusol/Anusol H.C.  (RX: Analapram 2.5%)  Sugar Substitutes:  Hydrocortisone OTC     Ok in moderation  Preparation H      Tucks        Vaseline lotion applied to tissue with wiping    Herpes:     Throat:  Acyclovir      Oragel  Famvir  Valtrex     Vaccines:         Flu Shot Leg  Cramps:       *Gardasil  Benadryl      Hepatitis A         Hepatitis B Nasal Spray:       Pneumovax  Saline Nasal Spray     Polio Booster         Tetanus Nausea:       Tuberculosis test or PPD  Vitamin B6 25 mg TID   AVOID:    Dramamine      *Gardasil  Emetrol       Live Poliovirus  Ginger Root 250 mg QID    MMR (measles, mumps &  High Complex Carbs @ Bedtime    rebella)  Sea Bands-Accupressure    Varicella (Chickenpox)  Unisom 1/2 tab TID     *No known complications           If received before Pain:         Known pregnancy;   Darvocet       Resume series after  Lortab        Delivery  Percocet    Yeast:   Tramadol      Femstat  Tylenol 3      Gyne-lotrimin  Ultram  Monistat  Vicodin           MISC:         All Sunscreens           Hair Coloring/highlights          Insect Repellant's          (Including DEET)         Mystic Tans

## 2017-03-03 NOTE — Progress Notes (Signed)
ROB- no complaints. Declines flu vaccine.  

## 2017-03-07 NOTE — Progress Notes (Signed)
ROB-Doing well, accompanied by FOB and sister to today's visit. Family history significant for breast cancer and diabetes- problem list updated to reflect. Anticipatory guidance regarding course of prenatal care. Reviewed red flag symptoms and when to call. RTC x 3 weeks for early glucola, anatomy scan, and ROB with Melody.

## 2017-03-24 ENCOUNTER — Other Ambulatory Visit: Payer: Self-pay

## 2017-03-24 ENCOUNTER — Other Ambulatory Visit: Payer: Self-pay | Admitting: Certified Nurse Midwife

## 2017-03-24 ENCOUNTER — Ambulatory Visit (INDEPENDENT_AMBULATORY_CARE_PROVIDER_SITE_OTHER): Payer: Medicaid Other

## 2017-03-24 ENCOUNTER — Other Ambulatory Visit: Payer: Medicaid Other

## 2017-03-24 ENCOUNTER — Ambulatory Visit (INDEPENDENT_AMBULATORY_CARE_PROVIDER_SITE_OTHER): Payer: Medicaid Other | Admitting: Obstetrics and Gynecology

## 2017-03-24 VITALS — BP 103/64 | HR 86 | Wt 204.1 lb

## 2017-03-24 DIAGNOSIS — Z3482 Encounter for supervision of other normal pregnancy, second trimester: Secondary | ICD-10-CM | POA: Diagnosis not present

## 2017-03-24 DIAGNOSIS — Z6833 Body mass index (BMI) 33.0-33.9, adult: Secondary | ICD-10-CM

## 2017-03-24 DIAGNOSIS — Z3492 Encounter for supervision of normal pregnancy, unspecified, second trimester: Secondary | ICD-10-CM | POA: Diagnosis not present

## 2017-03-24 DIAGNOSIS — Z131 Encounter for screening for diabetes mellitus: Secondary | ICD-10-CM

## 2017-03-24 DIAGNOSIS — Z3689 Encounter for other specified antenatal screening: Secondary | ICD-10-CM

## 2017-03-24 LAB — POCT URINALYSIS DIPSTICK
Bilirubin, UA: NEGATIVE
Ketones, UA: NEGATIVE
NITRITE UA: POSITIVE
PROTEIN UA: NEGATIVE
RBC UA: NEGATIVE
SPEC GRAV UA: 1.01 (ref 1.010–1.025)
Urobilinogen, UA: 0.2 E.U./dL
pH, UA: 6 (ref 5.0–8.0)

## 2017-03-24 MED ORDER — NITROFURANTOIN MONOHYD MACRO 100 MG PO CAPS: 100.0000 mg | ORAL_CAPSULE | Freq: Two times a day (BID) | ORAL | 1 refills | Status: DC

## 2017-03-24 NOTE — Progress Notes (Signed)
ROB,Glucola, and anatomy scan done today: Reviewed anatomy scan findings.  Indications: Anatomy Findings:  Singleton intrauterine pregnancy is visualized with FHR at 150 BPM. Biometrics give an (U/S) Gestational age of 10819 1/7 weeks and an (U/S) EDD of 08/17/17; this correlates with the clinically established EDD of 08/11/17.  Fetal presentation is vertex.  EFW: 267 grams (0lb 9oz). Placenta: Posterior and grade 1. AFI: WNL subjectively.  Anatomic survey is complete and appears WNL; Gender - Surprise.   Right Ovary measures 2.5 x 2.3 x 1.6 cm. It is normal in appearance. Left Ovary measures 1.9 x 1.8 x 1.3 cm. It is normal appearance. There is no obvious evidence of a corpus luteal cyst. Survey of the adnexa demonstrates no adnexal masses. There is no free peritoneal fluid in the cul de sac.  Impression: 1. 19 1/7 week Viable Singleton Intrauterine pregnancy by U/S. 2. (U/S) EDD is consistent with Clinically established (LMP) EDD of 08/11/17. 3. Normal Anatomy Scan

## 2017-03-24 NOTE — Progress Notes (Signed)
ROB- anatomy scan done today, pt has UTI denies any symptons

## 2017-03-25 ENCOUNTER — Other Ambulatory Visit: Payer: Self-pay | Admitting: Certified Nurse Midwife

## 2017-03-25 DIAGNOSIS — R7309 Other abnormal glucose: Secondary | ICD-10-CM

## 2017-03-25 LAB — GLUCOSE, 1 HOUR GESTATIONAL: GESTATIONAL DIABETES SCREEN: 150 mg/dL — AB (ref 65–139)

## 2017-03-26 LAB — URINE CULTURE: ORGANISM ID, BACTERIA: NO GROWTH

## 2017-03-29 ENCOUNTER — Encounter: Payer: Self-pay | Admitting: Obstetrics and Gynecology

## 2017-03-30 ENCOUNTER — Other Ambulatory Visit: Payer: Self-pay

## 2017-03-30 ENCOUNTER — Other Ambulatory Visit: Payer: Medicaid Other

## 2017-03-30 DIAGNOSIS — R7309 Other abnormal glucose: Secondary | ICD-10-CM

## 2017-04-05 ENCOUNTER — Other Ambulatory Visit: Payer: Self-pay

## 2017-04-05 ENCOUNTER — Emergency Department
Admission: EM | Admit: 2017-04-05 | Discharge: 2017-04-05 | Disposition: A | Discharge status: Home / Self Care | Payer: Medicaid Other | Attending: Emergency Medicine | Admitting: Emergency Medicine

## 2017-04-05 DIAGNOSIS — Z79899 Other long term (current) drug therapy: Secondary | ICD-10-CM | POA: Insufficient documentation

## 2017-04-05 DIAGNOSIS — F17210 Nicotine dependence, cigarettes, uncomplicated: Secondary | ICD-10-CM | POA: Insufficient documentation

## 2017-04-05 DIAGNOSIS — M542 Cervicalgia: Secondary | ICD-10-CM | POA: Diagnosis not present

## 2017-04-05 LAB — POCT PREGNANCY, URINE: Preg Test, Ur: POSITIVE — AB

## 2017-04-05 MED ORDER — OXYCODONE-ACETAMINOPHEN 5-325 MG PO TABS: 1.0000 | ORAL_TABLET | Freq: Four times a day (QID) | ORAL | 0 refills | Status: DC | PRN

## 2017-04-05 NOTE — ED Provider Notes (Addendum)
[facility] Emergency Department Provider Note   ____________________________________________   None    (approximate)  I have reviewed the triage vital signs and the nursing notes.   HISTORY  Chief Complaint Neck Pain    HPI Amy Cabrera is a 22 y.o. female patient complaining of increasing neck pain for 4 days.  Patient states having intermitting neck pain for 4 years but worse in the past 4 days.  Patient states pain starts at the top of her neck and radiates to the bilateral shoulders.  Patient denies loss of sensation.  Patient denies any provocative incident for neck pain.  Patient had no relief with muscle creams and anti-inflammatory medications.  Patient is 21 weeks of gestation.  Past Medical History:  Diagnosis Date  . Back pain     Patient Active Problem List   Diagnosis Date Noted  . Family history of breast cancer 03/03/2017  . Family history of type 2 diabetes mellitus 03/03/2017  . Allergic rhinitis 10/01/2014  . Low back pain 07/02/2014  . 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 WISDOM TOOTH EXTRACTION      Prior to Admission medications   Medication Sig Start Date End Date Taking? Authorizing Provider  nitrofurantoin, macrocrystal-monohydrate, (MACROBID) 100 MG capsule Take 1 capsule (100 mg total) by mouth 2 (two) times daily. 03/24/17   Shambley, Melody N, CNM  oxyCODONE-acetaminophen (PERCOCET/ROXICET) 5-325 MG tablet Take 1 tablet by mouth every 6 (six) hours as needed for severe pain. Take only for acute neck pain. 04/05/17   Joni Reining, PA-C  Prenatal Vit-Fe Fumarate-FA (PRENATAL MULTIVITAMIN) TABS tablet Take 1 tablet by mouth daily at 12 noon.    [provider]    Allergies Patient has no known allergies.  Family History  Problem Relation Age of Onset  . Cancer Maternal Grandmother        breast CA  . Heart disease Maternal Grandfather        CAD  . Cancer  Maternal Grandfather        colon CA  . Diabetes Maternal Grandfather     Social History Social History   Tobacco Use  . Smoking status: Current Every Day Smoker    Packs/day: 0.50    Years: 0.00    Pack years: 0.00    Types: Cigarettes  . Smokeless tobacco: Never Used  Substance Use Topics  . Alcohol use: No    Alcohol/week: 0.0 oz  . Drug use: No    Review of Systems Constitutional: No fever/chills Eyes: No visual changes. ENT: No sore throat. Cardiovascular: Denies chest pain. Respiratory: Denies shortness of breath. Gastrointestinal: No abdominal pain.  No nausea, no vomiting.  No diarrhea.  No constipation. Genitourinary: Negative for dysuria. Musculoskeletal: Positive for neck pain. Skin: Negative for rash.  Neurological: Negative for headaches, focal weakness or numbness.   ____________________________________________   PHYSICAL EXAM:  VITAL SIGNS: ED Triage Vitals  Enc Vitals Group     BP 04/05/17 1410 132/74     Pulse Rate 04/05/17 1410 (!) 120     Resp 04/05/17 1410 18     Temp 04/05/17 1410 98.4 F (36.9 C)     Temp Source 04/05/17 1410 Oral     SpO2 04/05/17 1410 98 %     Weight 04/05/17 1410 220 lb (99.8 kg)     Height 04/05/17 1410 5\' 6"  (1.676 m)     Head Circumference --  Peak Flow --      Pain Score 04/05/17 1419 8     Pain Loc --      Pain Edu? --      Excl. in GC? --    Constitutional: Alert and oriented. Well appearing and in no acute distress. Eyes: Conjunctivae are normal. PERRL. EOMI. Head: Atraumatic. Nose: No congestion/rhinnorhea. Mouth/Throat: Mucous membranes are moist.  Oropharynx non-erythematous. Neck: No stridor.  cervical spine tenderness to palpation L4 and 5.  Patient has full and equal range of motion of the cervical spine. Hematological/Lymphatic/Immunilogical: No cervical lymphadenopathy. Cardiovascular: Patient vital signs show she was tachycardic.  Patient retook the pulse and was found to be at 98.Regular  rhythm. Grossly normal heart sounds.  Good peripheral circulation. Respiratory: Normal respiratory effort.  No retractions. Lungs CTAB. Neurologic:  Normal speech and language. No gross focal neurologic deficits are appreciated. No gait instability. Skin:  Skin is warm, dry and intact. No rash noted. Psychiatric: Mood and affect are normal. Speech and behavior are normal.  ____________________________________________   LABS (all labs ordered are listed, but only abnormal results are displayed)  Labs Reviewed  POCT PREGNANCY, URINE - Abnormal; Notable for the following components:      Result Value   Preg Test, Ur POSITIVE (*)    All other components within normal limits   ____________________________________________  EKG   ____________________________________________  RADIOLOGY  ED MD interpretation:    Official radiology report(s): No results found.  ____________________________________________   PROCEDURES  Procedure(s) performed: None  Procedures  Critical Care performed: No  ____________________________________________   INITIAL IMPRESSION / ASSESSMENT AND PLAN / ED COURSE  As part of my medical decision making, I reviewed the following data within the electronic MEDICAL RECORD NUMBER    Patient presents with increasing neck pain the past 4 days.  Patient state history of chronic neck pain for 4 years.  Patient has not had a complaint evaluated prior to her gestational state.  Discussed patient rationale for no interventions at this time.  Advised continue anti-inflammatory medications of Tylenol and the muscle topical cream.  Advised to follow-up with PCP for continued care.      ____________________________________________   FINAL CLINICAL IMPRESSION(S) / ED DIAGNOSES  Final diagnoses:  Neck pain     ED Discharge Orders        Ordered    oxyCODONE-acetaminophen (PERCOCET/ROXICET) 5-325 MG tablet  Every 6 hours PRN     04/05/17 1446        Note:  This document was prepared using Dragon voice recognition software and may include unintentional dictation errors.    Joni ReiningSmith, Ronald K, PA-C 04/05/17 1451    Joni ReiningSmith, Ronald K, PA-C 04/05/17 1452    Emily FilbertWilliams, Jonathan E, MD 04/05/17 [phone]

## 2017-04-05 NOTE — Discharge Instructions (Addendum)
Due to your g gestational l state and the chronic nature of his neck pain imaging is counter indicated.  Advised to continue muscle creams and anti-inflammatory medication consisting of Tylenol.  Advised to have a complete evaluation after gestational state.

## 2017-04-05 NOTE — ED Triage Notes (Signed)
Pt c/o neck pain for the past 4 days, states she has been having this problem off and on for the past 4 years. Denies injury. States she has been taking tylenol and using a muscle pain cream with no relief. Pt is currently [redacted] weeks pregnant..Marland Kitchen

## 2017-04-06 ENCOUNTER — Other Ambulatory Visit: Payer: Self-pay

## 2017-04-06 ENCOUNTER — Encounter: Payer: Self-pay | Admitting: Obstetrics and Gynecology

## 2017-04-06 DIAGNOSIS — O99810 Abnormal glucose complicating pregnancy: Secondary | ICD-10-CM

## 2017-04-07 ENCOUNTER — Encounter: Payer: Self-pay | Admitting: *Deleted

## 2017-04-07 ENCOUNTER — Other Ambulatory Visit: Payer: Self-pay

## 2017-04-07 ENCOUNTER — Encounter: Payer: Medicaid Other | Attending: Certified Nurse Midwife | Admitting: *Deleted

## 2017-04-07 VITALS — BP 110/72 | Ht 66.0 in | Wt 197.3 lb

## 2017-04-07 DIAGNOSIS — O99810 Abnormal glucose complicating pregnancy: Secondary | ICD-10-CM | POA: Insufficient documentation

## 2017-04-07 DIAGNOSIS — O24410 Gestational diabetes mellitus in pregnancy, diet controlled: Secondary | ICD-10-CM

## 2017-04-07 MED ORDER — BLOOD GLUCOSE MONITOR KIT: PACK | 0 refills | Status: DC

## 2017-04-07 MED ORDER — GLUCOSE BLOOD VI STRP: ORAL_STRIP | 12 refills | Status: DC

## 2017-04-07 NOTE — Patient Instructions (Signed)
Read booklet on Gestational Diabetes Follow Gestational Meal Planning Guidelines Limit fried foods Avoid fruit juice and sugar sweetened drinks Complete a 3 Day Food Record and bring to next appointment Check blood sugars 4 x day - before breakfast and 2 hrs after every meal and record  Bring blood sugar log to all appointments Call MD for prescription for meter strips and lancets Strips Accu-Chek Guide Lancets   Accu-Chek FastClix Purchase urine ketone strips if blood sugars not controlled and check urine ketones every am:  If + increase bedtime snack to 1 protein and 2 carbohydrate servings Walk 20-30 minutes at least 5 x week if permitted by MD Quit smoking

## 2017-04-07 NOTE — Progress Notes (Signed)
Diabetes Self-Management Education  Visit Type: First/Initial  Appt. Start Time: 1350 Appt. End Time: 1515  04/07/2017  [name], identified by name and date of birth, is a 22 y.o. female with a diagnosis of Diabetes: Gestational Diabetes.   ASSESSMENT  Blood pressure 110/72, height 5\' 6"  (1.676 m), weight 197 lb 4.8 oz (89.5 kg), last menstrual period 11/04/2016. Body mass index is 31.85 kg/m.  Diabetes Self-Management Education - 04/07/17 1617      Visit Information   Visit Type  First/Initial      Initial Visit   Diabetes Type  Gestational Diabetes    Are you currently following a meal plan?  No    Are you taking your medications as prescribed?  Yes    Date Diagnosed  04/06/17      Health Coping   How would you rate your overall health?  Fair      Psychosocial Assessment   Patient Belief/Attitude about Diabetes  Other (comment) "no feelings"    Self-care barriers  None    Self-management support  Doctor's office    Patient Concerns  Nutrition/Meal planning;Weight Control    Special Needs  None    Preferred Learning Style  Visual;Hands on    Learning Readiness  Ready    How often do you need to have someone help you when you read instructions, pamphlets, or other written materials from your doctor or pharmacy?  1 - Never    What is the last grade level you completed in school?  12th      Pre-Education Assessment   Patient understands the diabetes disease and treatment process.  Needs Instruction    Patient understands incorporating nutritional management into lifestyle.  Needs Instruction    Patient undertands incorporating physical activity into lifestyle.  Needs Instruction    Patient understands using medications safely.  Needs Instruction    Patient understands monitoring blood glucose, interpreting and using results  Needs Instruction    Patient understands prevention, detection, and treatment of acute complications.  Needs Instruction    Patient  understands prevention, detection, and treatment of chronic complications.  Needs Instruction    Patient understands how to develop strategies to address psychosocial issues.  Needs Instruction    Patient understands how to develop strategies to promote health/change behavior.  Needs Instruction      Complications   How often do you check your blood sugar?  0 times/day (not testing) Provided Accu-Chek Guide meter and instructed on use. BG upon return demonstration was 88 mg/dL at 1:613:05 pm - 6 hrs pp.    Have you had a dilated eye exam in the past 12 months?  No    Have you had a dental exam in the past 12 months?  No    Are you checking your feet?  No      Dietary Intake   Breakfast  poptart; chicken or ham biscuit    Lunch  ham or bologna sandwich and sometimes chips    Snack (afternoon)  popcorn, fruit    Dinner  beef, chicken with bread, potatoes, pasta, corn, green beans, salad    Beverage(s)  water, fruit juice, soft drinks, diet soda      Exercise   Exercise Type  ADL's      Patient Education   Previous Diabetes Education  No    Disease state   Definition of diabetes, type 1 and 2, and the diagnosis of diabetes    Nutrition management  Role of diet in the treatment of diabetes and the relationship between the three main macronutrients and blood glucose level;Reviewed blood glucose goals for pre and post meals and how to evaluate the patients' food intake on their blood glucose level.    Physical activity and exercise   Role of exercise on diabetes management, blood pressure control and cardiac health.    Monitoring  Taught/evaluated SMBG meter.;Purpose and frequency of SMBG.;Taught/discussed recording of test results and interpretation of SMBG.;Identified appropriate SMBG and/or A1C goals.;Ketone testing, when, how.    Chronic complications  Relationship between chronic complications and blood glucose control    Psychosocial adjustment  Identified and addressed patients feelings and  concerns about diabetes    Preconception care  Pregnancy and GDM  Role of pre-pregnancy blood glucose control on the development of the fetus;Role of family planning for patients with diabetes;Reviewed with patient blood glucose goals with pregnancy      Individualized Goals (developed by patient)   Reducing Risk Lose weight Become more fit Quit smoking     Outcomes   Expected Outcomes  Demonstrated interest in learning. Expect positive outcomes    Future DMSE  2 wks       Individualized Plan for Diabetes Self-Management Training:   Learning Objective:  Patient will have a greater understanding of diabetes self-management. Patient education plan is to attend individual and/or group sessions per assessed needs and concerns.   Plan:   Patient Instructions  Read booklet on Gestational Diabetes Follow Gestational Meal Planning Guidelines Limit fried foods Avoid fruit juice and sugar sweetened drinks Complete a 3 Day Food Record and bring to next appointment Check blood sugars 4 x day - before breakfast and 2 hrs after every meal and record  Bring blood sugar log to all appointments Call MD for prescription for meter strips and lancets Strips Accu-Chek Guide Lancets   Accu-Chek FastClix Purchase urine ketone strips if blood sugars not controlled and check urine ketones every am:  If + increase bedtime snack to 1 protein and 2 carbohydrate servings Walk 20-30 minutes at least 5 x week if permitted by MD Quit smoking   Expected Outcomes:  Demonstrated interest in learning. Expect positive outcomes  Education material provided:  Gestational Booklet Gestational Meal Planning Guidelines Simple Meal Plan Viewed Gestational Diabetes Video Meter = Accu-Chek Guide 3 Day Food Record Goals for a Healthy Pregnancy  If problems or questions, patient to contact team via:  Sharion Settler, RN, CCM, CDE [phone]  Future DSME appointment: 2 wks  April 21, 2017 with the dietitian

## 2017-04-08 ENCOUNTER — Encounter: Payer: Self-pay | Admitting: Certified Nurse Midwife

## 2017-04-10 ENCOUNTER — Encounter: Payer: Self-pay | Admitting: Certified Nurse Midwife

## 2017-04-11 ENCOUNTER — Other Ambulatory Visit: Payer: Self-pay

## 2017-04-11 MED ORDER — GLUCOSE BLOOD VI STRP: ORAL_STRIP | 12 refills | Status: DC

## 2017-04-21 ENCOUNTER — Encounter: Payer: Medicaid Other | Attending: Certified Nurse Midwife | Admitting: Dietician

## 2017-04-21 ENCOUNTER — Encounter: Payer: Self-pay | Admitting: Dietician

## 2017-04-21 VITALS — BP 102/64 | Ht 66.0 in | Wt 197.4 lb

## 2017-04-21 DIAGNOSIS — O99810 Abnormal glucose complicating pregnancy: Secondary | ICD-10-CM | POA: Diagnosis not present

## 2017-04-21 DIAGNOSIS — O24410 Gestational diabetes mellitus in pregnancy, diet controlled: Secondary | ICD-10-CM

## 2017-04-21 NOTE — Patient Instructions (Signed)
   Make sure to include a protein food with every meal and snack. Keep a quick, easy protein on hand, such as cheese slices or sticks, nuts, or peanut butter.   Eat something every 3-5 hours during the day to keep blood sugar steady and prevent low blood sugars.   Include low-carb/ free veggies as often as possible with meals to boost nutrition and prevent over-eating other foods.   Try walking for 10-15 minutes after eating to prevent high BGs.

## 2017-04-21 NOTE — Progress Notes (Signed)
   Patient's BG record indicates BGs are generally within goal ranges, with fasting BGs ranging 81-103, and post-meal BGs ranging 82-125 + one reading of 148.  Patient's food diary indicates varying amounts of carbohydrate intake, some meals with high sugar content, some meals lacking protein source, and no vegetables or fruits.    Provided 1800kcal meal plan, and wrote individualized menus based on patient's food preferences.  Advised protein source with every meal and snack, controlled carb amount and healthier carb choices whenever possible, and inclusion of vegetables and fruits regularly.   Instructed patient on food safety, including avoidance of Listeriosis, and limiting mercury from fish.  Discussed importance of maintaining healthy lifestyle habits to reduce risk of Type 2 DM as well as Gestational DM with any future pregnancies.  Advised patient to use any remaining testing supplies to test some BGs after delivery, and to have BG tested ideally annually, as well as prior to attempting future pregnancies.

## 2017-04-22 ENCOUNTER — Encounter: Payer: Self-pay | Admitting: Certified Nurse Midwife

## 2017-04-22 ENCOUNTER — Ambulatory Visit (INDEPENDENT_AMBULATORY_CARE_PROVIDER_SITE_OTHER): Payer: Medicaid Other | Admitting: Certified Nurse Midwife

## 2017-04-22 VITALS — BP 98/57 | HR 95 | Wt 198.4 lb

## 2017-04-22 DIAGNOSIS — R7309 Other abnormal glucose: Secondary | ICD-10-CM

## 2017-04-22 NOTE — Patient Instructions (Signed)

## 2017-04-22 NOTE — Progress Notes (Signed)
ROB, doing well. PT states that she has been monitoring her blood sugars and the fasting's have been in the 80's , 2 hr pp 100-130's. Discussed attempting to get another 3 hr at next visit. She agrees with plan. She feels good fetal movement. Follow up 4 wks.   Doreene BurkeAnnie Marabelle Cushman, CNM

## 2017-04-22 NOTE — Progress Notes (Signed)
Pt is here for an ROB visit. 

## 2017-05-19 ENCOUNTER — Other Ambulatory Visit: Payer: Medicaid Other

## 2017-05-19 ENCOUNTER — Ambulatory Visit (INDEPENDENT_AMBULATORY_CARE_PROVIDER_SITE_OTHER): Payer: Medicaid Other | Admitting: Certified Nurse Midwife

## 2017-05-19 VITALS — BP 95/67 | HR 101 | Wt 201.2 lb

## 2017-05-19 DIAGNOSIS — Z130 Encounter for screening for diseases of the blood and blood-forming organs and certain disorders involving the immune mechanism: Secondary | ICD-10-CM

## 2017-05-19 DIAGNOSIS — Z23 Encounter for immunization: Secondary | ICD-10-CM | POA: Diagnosis not present

## 2017-05-19 DIAGNOSIS — Z131 Encounter for screening for diabetes mellitus: Secondary | ICD-10-CM

## 2017-05-19 DIAGNOSIS — Z113 Encounter for screening for infections with a predominantly sexual mode of transmission: Secondary | ICD-10-CM

## 2017-05-19 DIAGNOSIS — Z3493 Encounter for supervision of normal pregnancy, unspecified, third trimester: Secondary | ICD-10-CM | POA: Diagnosis not present

## 2017-05-19 DIAGNOSIS — R7309 Other abnormal glucose: Secondary | ICD-10-CM

## 2017-05-19 LAB — POCT URINALYSIS DIPSTICK
Bilirubin, UA: NEGATIVE
Glucose, UA: NEGATIVE
Ketones, UA: NEGATIVE
LEUKOCYTES UA: NEGATIVE
NITRITE UA: NEGATIVE
PROTEIN UA: NEGATIVE
SPEC GRAV UA: 1.015 (ref 1.010–1.025)
Urobilinogen, UA: 0.2 E.U./dL
pH, UA: 6.5 (ref 5.0–8.0)

## 2017-05-19 NOTE — Progress Notes (Signed)
ROB- pt is doing 3 hr GTT, she is doing well, blood consent signed

## 2017-05-19 NOTE — Progress Notes (Signed)
ROB-Doing well, GTT today. Naming daughter "Percell [place]". Blood transfusion consent reviewed and signed. TDaP given. Anticipatory guidance regarding course of prenatal care. Discussed childbirth classes, intrapartum pain management options including [facility] volunteer doula program, cord blood banking, postpartum contraception, infant feeding options, and pediatrician selection; handouts given. Reviewed red flag symptoms and when to call. RTC x 2 week for ROB or sooner if needed.

## 2017-05-19 NOTE — Patient Instructions (Addendum)
[place], [place], [place] [address]  Phone: [phone]   [facility] (second location)  [address] [place], [place] [address]  Phone: [phone]   [facility]) [place], [place], [place] [address] Phone: [phone]   [facility] [place], [place] [address]  Phone: [phone] Blood Banking Information Cord blood banking is the process of collecting and storing the blood that is in the umbilical cord and placenta at the time of delivery. This blood contains stem cells, which can be used to treat many blood diseases, immune system disorders, and childhood cancers. Stem cells can also be used to research certain diseases and treatments. Many people who choose cord blood banking donate the blood. Donated blood can be used in lifesaving treatments or for research. Other people choose to store the blood privately. Blood that is stored privately can only be used with the person's permission. This option is often chosen if:  A family member needs a stem cell transplant.  The child is part of an ethnic minority.  The child was conceived through in vitro fertilization.  What should I look for in a blood bank? A blood bank is the organization that coordinates cord blood banking. Make sure the cord blood bank that you use:  Is accredited.  Is financially stable.  Handles a large volume of cord blood samples.  Has a procedure in place for transport and storage.  Allows you the option of transferring your cord blood sample.  Has a procedure in place if the bank goes out of business.  Clearly states all costs and limits to future costs.  People who choose to donate cord blood should not need to pay for blood banking. People who keep the blood for private use will need to pay for the first (initial) storage and pay a fee each year (annual fee).  Other fees may also apply. What are the risks of cord blood banking? There are no health risks associated with cord blood banking. It is considered safe. How should I prepare? You must schedule this process at least 4-6 weeks before you will be giving birth. How is the blood collected? The blood is collected as soon as the baby has been delivered. Within 15 minutes of delivery, a health care provider will take these actions to collect the blood:  Clamp the umbilical cord at the top and bottom. This traps the blood in the umbilical cord.  Use a syringe or bag to collect the blood.  Insert needles into the placenta to collect (draw out) more blood.  What happens after the blood is collected? After the blood has been collected:  The blood will be sent to a blood bank.  The blood will be tested for genetic problems and infectious diseases. If the blood tests positive for a genetic problem or a disease, someone will contact you and let you know.  The blood will be frozen.  If your child develops a genetic condition, immune system disorder, or cancer, you will be responsible for contacting the blood bank and letting them know. This information is not intended to replace advice given to you by your health care provider. Make sure you discuss any questions you have with your health care provider. Document Released: 06/17/2009 Document Revised: 06/05/2015 Document Reviewed: 06/17/2014 Elsevier Interactive Patient Education  2018 [employer]. Pain Relief During Labor and Delivery Many things can cause  pain during labor and delivery, including:  Pressure on bones and ligaments due to the baby moving through the pelvis.  Stretching of tissues due to the baby moving through the birth canal.  Muscle tension due to anxiety or nervousness.  The uterus tightening (contracting) and relaxing to help move the baby.  There are many ways to deal with the pain of labor and delivery. They  include:  Taking prenatal classes. Taking these classes helps you know what to expect during your baby's birth. What you learn will increase your confidence and decrease your anxiety.  Practicing relaxation techniques or doing relaxing activities, such as: ? Focused breathing. ? Meditation. ? Visualization. ? Aroma therapy. ? Listening to your favorite music. ? Hypnosis.  Taking a warm shower or bath (hydrotherapy). This may: ? Provide comfort and relaxation. ? Lessen your perception of pain. ? Decrease the amount of pain medicine needed. ? Decrease the length of labor.  Getting a massage or counterpressure on your back.  Applying warm packs or ice packs.  Changing positions often, moving around, or using a birthing ball.  Getting: ? Pain medicine through an IV or injection into a muscle. ? Pain medicine inserted into your spinal column. ? Injections of sterile water just under the skin on your lower back (intradermal injections). ? Laughing gas (nitrous oxide).  Discuss your pain control options with your health care provider during your prenatal visits. Explore the options offered by your hospital or birth center. What kinds of medicine are available? There are two kinds of medicines that can be used to relieve pain during labor and delivery:  Analgesics. These medicines decrease pain without causing you to lose feeling or the ability to move your muscles.  Anesthetics. These medicines block feeling in the body and can decrease your ability to move freely.  Both of these kinds of medicine can cause minor side effects, such as nausea, trouble concentrating, and sleepiness. They can also decrease the baby's heart rate before birth and affect the baby's breathing rate after birth. For this reason, health care providers are careful about when and how much medicine is given. What are specific medicines and procedures that provide pain relief? Local Anesthetics Local anesthetics  are used to numb a small area of the body. They may be used along with another kind of anesthetic or used to numb the nerves of the vagina, cervix, and perineum during the second stage of labor. General Anesthetics General anesthetics cause you to lose consciousness so you do not feel pain. They are usually only used for an emergency cesarean delivery. General anesthetics are given through an IV tube and a mask. Pudendal Block A pudendal block is a form of local anesthetic. It may be used to relieve the pain associated with pushing or stretching of the perineum at the time of delivery or to further numb the perineum. A pudendal block is done by injecting numbing medicine through the vaginal wall into a nerve in the pelvis. Epidural Analgesia Epidural analgesia is given through a flexible IV catheter that is inserted into the lower back. Numbing medicine is delivered continuously to the area near your spinal column nerves (epidural space). After having this type of analgesia, you may be able to move your legs but you most likely will not be able to walk. Depending on the amount of medicine given, you may lose all feeling in the lower half of your body, or you may retain some level of sensation, including the urge to  push. Epidural analgesia can be used to provide pain relief for a vaginal birth. Spinal Block A spinal block is similar to epidural analgesia, but the medicine is injected into the spinal fluid instead of the epidural space. A spinal block is only given once. It starts to relieve pain quickly, but the pain relief lasts only 1-6 hours. Spinal blocks can be used for cesarean deliveries. Combined Spinal-Epidural (CSE) Block A CSE block combines the effects of a spinal block and epidural analgesia. The spinal block works quickly to block all pain. The epidural analgesia provides continuous pain relief, even after the effects of the spinal block have worn off. This information is not intended to  replace advice given to you by your health care provider. Make sure you discuss any questions you have with your health care provider. Document Released: 04/15/2008 Document Revised: 06/06/2015 Document Reviewed: 05/21/2015 Elsevier Interactive Patient Education  2018 [employer]. Breastfeeding Choosing to breastfeed is one of the best decisions you can make for yourself and your baby. A change in hormones during pregnancy causes your breasts to make breast milk in your milk-producing glands. Hormones prevent breast milk from being released before your baby is born. They also prompt milk flow after birth. Once breastfeeding has begun, thoughts of your baby, as well as his or her sucking or crying, can stimulate the release of milk from your milk-producing glands. Benefits of breastfeeding Research shows that breastfeeding offers many health benefits for infants and mothers. It also offers a cost-free and convenient way to feed your baby. For your baby  Your first milk (colostrum) helps your baby's digestive system to function better.  Special cells in your milk (antibodies) help your baby to fight off infections.  Breastfed babies are less likely to develop asthma, allergies, obesity, or type 2 diabetes. They are also at lower risk for sudden infant death syndrome (SIDS).  Nutrients in breast milk are better able to meet your baby's needs compared to infant formula.  Breast milk improves your baby's brain development. For you  Breastfeeding helps to create a very special bond between you and your baby.  Breastfeeding is convenient. Breast milk costs nothing and is always available at the correct temperature.  Breastfeeding helps to burn calories. It helps you to lose the weight that you gained during pregnancy.  Breastfeeding makes your uterus return faster to its size before pregnancy. It also slows bleeding (lochia) after you give birth.  Breastfeeding helps to lower your risk of  developing type 2 diabetes, osteoporosis, rheumatoid arthritis, cardiovascular disease, and breast, ovarian, uterine, and endometrial cancer later in life. Breastfeeding basics Starting breastfeeding  Find a comfortable place to sit or lie down, with your neck and back well-supported.  Place a pillow or a rolled-up blanket under your baby to bring him or her to the level of your breast (if you are seated). Nursing pillows are specially designed to help support your arms and your baby while you breastfeed.  Make sure that your baby's tummy (abdomen) is facing your abdomen.  Gently massage your breast. With your fingertips, massage from the outer edges of your breast inward toward the nipple. This encourages milk flow. If your milk flows slowly, you may need to continue this action during the feeding.  Support your breast with 4 fingers underneath and your thumb above your nipple (make the letter "C" with your hand). Make sure your fingers are well away from your nipple and your baby's mouth.  Stroke your baby's lips  gently with your finger or nipple.  When your baby's mouth is open wide enough, quickly bring your baby to your breast, placing your entire nipple and as much of the areola as possible into your baby's mouth. The areola is the colored area around your nipple. ? More areola should be visible above your baby's upper lip than below the lower lip. ? Your baby's lips should be opened and extended outward (flanged) to ensure an adequate, comfortable latch. ? Your baby's tongue should be between his or her lower gum and your breast.  Make sure that your baby's mouth is correctly positioned around your nipple (latched). Your baby's lips should create a seal on your breast and be turned out (everted).  It is common for your baby to suck about 2-3 minutes in order to start the flow of breast milk. Latching Teaching your baby how to latch onto your breast properly is very important. An  improper latch can cause nipple pain, decreased milk supply, and poor weight gain in your baby. Also, if your baby is not latched onto your nipple properly, he or she may swallow some air during feeding. This can make your baby fussy. Burping your baby when you switch breasts during the feeding can help to get rid of the air. However, teaching your baby to latch on properly is still the best way to prevent fussiness from swallowing air while breastfeeding. Signs that your baby has successfully latched onto your nipple  Silent tugging or silent sucking, without causing you pain. Infant's lips should be extended outward (flanged).  Swallowing heard between every 3-4 sucks once your milk has started to flow (after your let-down milk reflex occurs).  Muscle movement above and in front of his or her ears while sucking.  Signs that your baby has not successfully latched onto your nipple  Sucking sounds or smacking sounds from your baby while breastfeeding.  Nipple pain.  If you think your baby has not latched on correctly, slip your finger into the corner of your baby's mouth to break the suction and place it between your baby's gums. Attempt to start breastfeeding again. Signs of successful breastfeeding Signs from your baby  Your baby will gradually decrease the number of sucks or will completely stop sucking.  Your baby will fall asleep.  Your baby's body will relax.  Your baby will retain a small amount of milk in his or her mouth.  Your baby will let go of your breast by himself or herself.  Signs from you  Breasts that have increased in firmness, weight, and size 1-3 hours after feeding.  Breasts that are softer immediately after breastfeeding.  Increased milk volume, as well as a change in milk consistency and color by the fifth day of breastfeeding.  Nipples that are not sore, cracked, or bleeding.  Signs that your baby is getting enough milk  Wetting at least 1-2 diapers  during the first 24 hours after birth.  Wetting at least 5-6 diapers every 24 hours for the first week after birth. The urine should be clear or pale yellow by the age of 5 days.  Wetting 6-8 diapers every 24 hours as your baby continues to grow and develop.  At least 3 stools in a 24-hour period by the age of 5 days. The stool should be soft and yellow.  At least 3 stools in a 24-hour period by the age of 7 days. The stool should be seedy and yellow.  No loss of weight  greater than 10% of birth weight during the first 3 days of life.  Average weight gain of 4-7 oz (113-198 g) per week after the age of 4 days.  Consistent daily weight gain by the age of 5 days, without weight loss after the age of 2 weeks. After a feeding, your baby may spit up a small amount of milk. This is normal. Breastfeeding frequency and duration Frequent feeding will help you make more milk and can prevent sore nipples and extremely full breasts (breast engorgement). Breastfeed when you feel the need to reduce the fullness of your breasts or when your baby shows signs of hunger. This is called "breastfeeding on demand." Signs that your baby is hungry include:  Increased alertness, activity, or restlessness.  Movement of the head from side to side.  Opening of the mouth when the corner of the mouth or cheek is stroked (rooting).  Increased sucking sounds, smacking lips, cooing, sighing, or squeaking.  Hand-to-mouth movements and sucking on fingers or hands.  Fussing or crying.  Avoid introducing a pacifier to your baby in the first 4-6 weeks after your baby is born. After this time, you may choose to use a pacifier. Research has shown that pacifier use during the first year of a baby's life decreases the risk of sudden infant death syndrome (SIDS). Allow your baby to feed on each breast as long as he or she wants. When your baby unlatches or falls asleep while feeding from the first breast, offer the second  breast. Because newborns are often sleepy in the first few weeks of life, you may need to awaken your baby to get him or her to feed. Breastfeeding times will vary from baby to baby. However, the following rules can serve as a guide to help you make sure that your baby is properly fed:  Newborns (babies 79 weeks of age or younger) may breastfeed every 1-3 hours.  Newborns should not go without breastfeeding for longer than 3 hours during the day or 5 hours during the night.  You should breastfeed your baby a minimum of 8 times in a 24-hour period.  Breast milk pumping Pumping and storing breast milk allows you to make sure that your baby is exclusively fed your breast milk, even at times when you are unable to breastfeed. This is especially important if you go back to work while you are still breastfeeding, or if you are not able to be present during feedings. Your lactation consultant can help you find a method of pumping that works best for you and give you guidelines about how long it is safe to store breast milk. Caring for your breasts while you breastfeed Nipples can become dry, cracked, and sore while breastfeeding. The following recommendations can help keep your breasts moisturized and healthy:  Avoid using soap on your nipples.  Wear a supportive bra designed especially for nursing. Avoid wearing underwire-style bras or extremely tight bras (sports bras).  Air-dry your nipples for 3-4 minutes after each feeding.  Use only cotton bra pads to absorb leaked breast milk. Leaking of breast milk between feedings is normal.  Use lanolin on your nipples after breastfeeding. Lanolin helps to maintain your skin's normal moisture barrier. Pure lanolin is not harmful (not toxic) to your baby. You may also hand express a few drops of breast milk and gently massage that milk into your nipples and allow the milk to air-dry.  In the first few weeks after giving birth, some women experience breast  engorgement. Engorgement can make your breasts feel heavy, warm, and tender to the touch. Engorgement peaks within 3-5 days after you give birth. The following recommendations can help to ease engorgement:  Completely empty your breasts while breastfeeding or pumping. You may want to start by applying warm, moist heat (in the shower or with warm, water-soaked hand towels) just before feeding or pumping. This increases circulation and helps the milk flow. If your baby does not completely empty your breasts while breastfeeding, pump any extra milk after he or she is finished.  Apply ice packs to your breasts immediately after breastfeeding or pumping, unless this is too uncomfortable for you. To do this: ? Put ice in a plastic bag. ? Place a towel between your skin and the bag. ? Leave the ice on for 20 minutes, 2-3 times a day.  Make sure that your baby is latched on and positioned properly while breastfeeding.  If engorgement persists after 48 hours of following these recommendations, contact your health care provider or a Science writer. Overall health care recommendations while breastfeeding  Eat 3 healthy meals and 3 snacks every day. Well-nourished mothers who are breastfeeding need an additional 450-500 calories a day. You can meet this requirement by increasing the amount of a balanced diet that you eat.  Drink enough water to keep your urine pale yellow or clear.  Rest often, relax, and continue to take your prenatal vitamins to prevent fatigue, stress, and low vitamin and mineral levels in your body (nutrient deficiencies).  Do not use any products that contain nicotine or tobacco, such as cigarettes and e-cigarettes. Your baby may be harmed by chemicals from cigarettes that pass into breast milk and exposure to secondhand smoke. If you need help quitting, ask your health care provider.  Avoid alcohol.  Do not use illegal drugs or marijuana.  Talk with your health care  provider before taking any medicines. These include over-the-counter and prescription medicines as well as vitamins and herbal supplements. Some medicines that may be harmful to your baby can pass through breast milk.  It is possible to become pregnant while breastfeeding. If birth control is desired, ask your health care provider about options that will be safe while breastfeeding your baby. Where to find more information: [employer]: www.llli.org Contact a health care provider if:  You feel like you want to stop breastfeeding or have become frustrated with breastfeeding.  Your nipples are cracked or bleeding.  Your breasts are red, tender, or warm.  You have: ? Painful breasts or nipples. ? A swollen area on either breast. ? A fever or chills. ? Nausea or vomiting. ? Drainage other than breast milk from your nipples.  Your breasts do not become full before feedings by the fifth day after you give birth.  You feel sad and depressed.  Your baby is: ? Too sleepy to eat well. ? Having trouble sleeping. ? More than 2 week old and wetting fewer than 6 diapers in a 24-hour period. ? Not gaining weight by 51 days of age.  Your baby has fewer than 3 stools in a 24-hour period.  Your baby's skin or the white parts of his or her eyes become yellow. Get help right away if:  Your baby is overly tired (lethargic) and does not want to wake up and feed.  Your baby develops an unexplained fever. Summary  Breastfeeding offers many health benefits for infant and mothers.  Try to breastfeed your infant when he or  she shows early signs of hunger.  Gently tickle or stroke your baby's lips with your finger or nipple to allow the baby to open his or her mouth. Bring the baby to your breast. Make sure that much of the areola is in your baby's mouth. Offer one side and burp the baby before you offer the other side.  Talk with your health care provider or lactation consultant  if you have questions or you face problems as you breastfeed. This information is not intended to replace advice given to you by your health care provider. Make sure you discuss any questions you have with your health care provider. Document Released: 12/28/2004 Document Revised: 01/30/2016 Document Reviewed: 01/30/2016 Elsevier Interactive Patient Education  2018 [employer]. Common Medications Safe in Pregnancy  Acne:      Constipation:  Benzoyl Peroxide     Colace  Clindamycin      Dulcolax Suppository  Topica Erythromycin     Fibercon  Salicylic Acid      Metamucil         Miralax AVOID:        Senakot   Accutane    Cough:  Retin-A       Cough Drops  Tetracycline      Phenergan w/ Codeine if Rx  Minocycline      Robitussin (Plain & DM)  Antibiotics:     Crabs/Lice:  Ceclor       RID  Cephalosporins    AVOID:  E-Mycins      Kwell  Keflex  Macrobid/Macrodantin   Diarrhea:  Penicillin      Kao-Pectate  Zithromax      Imodium AD         PUSH FLUIDS AVOID:       Cipro     Fever:  Tetracycline      Tylenol (Regular or Extra  Minocycline       Strength)  Levaquin      Extra Strength-Do not          Exceed 8 tabs/24 hrs Caffeine:        <251m/day (equiv. To 1 cup of coffee or  approx. 3 12 oz sodas)         Gas: Cold/Hayfever:       Gas-X  Benadryl      Mylicon  Claritin       Phazyme  **Claritin-D        Chlor-Trimeton    Headaches:  Dimetapp      ASA-Free Excedrin  Drixoral-Non-Drowsy     Cold Compress  Mucinex (Guaifenasin)     Tylenol (Regular or Extra  Sudafed/Sudafed-12 Hour     Strength)  **Sudafed PE Pseudoephedrine   Tylenol Cold & Sinus     Vicks Vapor Rub  Zyrtec  **AVOID if Problems With Blood Pressure         Heartburn: Avoid lying down for at least 1 hour after meals  Aciphex      Maalox     Rash:  Milk of Magnesia     Benadryl    Mylanta       1% Hydrocortisone Cream  Pepcid  Pepcid Complete   Sleep  Aids:  Prevacid      Ambien   Prilosec       Benadryl  Rolaids       Chamomile Tea  Tums (Limit 4/day)     Unisom  Zantac       Tylenol PM  Warm milk-add vanilla or  Hemorrhoids:       Sugar for taste  Anusol/Anusol H.C.  (RX: Analapram 2.5%)  Sugar Substitutes:  Hydrocortisone OTC     Ok in moderation  Preparation H      Tucks        Vaseline lotion applied to tissue with wiping    Herpes:     Throat:  Acyclovir      Oragel  Famvir  Valtrex     Vaccines:         Flu Shot Leg Cramps:       *Gardasil  Benadryl      Hepatitis A         Hepatitis B Nasal Spray:       Pneumovax  Saline Nasal Spray     Polio Booster         Tetanus Nausea:       Tuberculosis test or PPD  Vitamin B6 25 mg TID   AVOID:    Dramamine      *Gardasil  Emetrol       Live Poliovirus  Ginger Root 250 mg QID    MMR (measles, mumps &  High Complex Carbs @ Bedtime    rebella)  Sea Bands-Accupressure    Varicella (Chickenpox)  Unisom 1/2 tab TID     *No known complications           If received before Pain:         Known pregnancy;   Darvocet       Resume series after  Lortab        Delivery  Percocet    Yeast:   Tramadol      Femstat  Tylenol 3      Gyne-lotrimin  Ultram       Monistat  Vicodin           MISC:         All Sunscreens           Hair Coloring/highlights          Insect Repellant's          (Including DEET)         Mystic Tans Third Trimester of Pregnancy The third trimester is from week 29 through week 42, months 7 through 9. This trimester is when your unborn baby (fetus) is growing very fast. At the end of the ninth month, the unborn baby is about 20 inches in length. It weighs about 6-10 pounds. Follow these instructions at home:  Avoid all smoking, herbs, and alcohol. Avoid drugs not approved by your doctor.  Do not use any tobacco products, including cigarettes, chewing tobacco, and electronic cigarettes. If you need help quitting, ask your doctor. You may get  counseling or other support to help you quit.  Only take medicine as told by your doctor. Some medicines are safe and some are not during pregnancy.  Exercise only as told by your doctor. Stop exercising if you start having cramps.  Eat regular, healthy meals.  Wear a good support bra if your breasts are tender.  Do not use hot tubs, steam rooms, or saunas.  Wear your seat belt when driving.  Avoid raw meat, uncooked cheese, and liter boxes and soil used by cats.  Take your prenatal vitamins.  Take 1500-2000 milligrams of calcium daily starting at the 20th week of pregnancy until you deliver your baby.  Try taking medicine that helps you poop (stool softener) as needed,  and if your doctor approves. Eat more fiber by eating fresh fruit, vegetables, and whole grains. Drink enough fluids to keep your pee (urine) clear or pale yellow.  Take warm water baths (sitz baths) to soothe pain or discomfort caused by hemorrhoids. Use hemorrhoid cream if your doctor approves.  If you have puffy, bulging veins (varicose veins), wear support hose. Raise (elevate) your feet for 15 minutes, 3-4 times a day. Limit salt in your diet.  Avoid heavy lifting, wear low heels, and sit up straight.  Rest with your legs raised if you have leg cramps or low back pain.  Visit your dentist if you have not gone during your pregnancy. Use a soft toothbrush to brush your teeth. Be gentle when you floss.  You can have sex (intercourse) unless your doctor tells you not to.  Do not travel far distances unless you must. Only do so with your doctor's approval.  Take prenatal classes.  Practice driving to the hospital.  Pack your hospital bag.  Prepare the baby's room.  Go to your doctor visits. Get help if:  You are not sure if you are in labor or if your water has broken.  You are dizzy.  You have mild cramps or pressure in your lower belly (abdominal).  You have a nagging pain in your belly  area.  You continue to feel sick to your stomach (nauseous), throw up (vomit), or have watery poop (diarrhea).  You have bad smelling fluid coming from your vagina.  You have pain with peeing (urination). Get help right away if:  You have a fever.  You are leaking fluid from your vagina.  You are spotting or bleeding from your vagina.  You have severe belly cramping or pain.  You lose or gain weight rapidly.  You have trouble catching your breath and have chest pain.  You notice sudden or extreme puffiness (swelling) of your face, hands, ankles, feet, or legs.  You have not felt the baby move in over an hour.  You have severe headaches that do not go away with medicine.  You have vision changes. This information is not intended to replace advice given to you by your health care provider. Make sure you discuss any questions you have with your health care provider. Document Released: 03/24/2009 Document Revised: 06/05/2015 Document Reviewed: 02/29/2012 Elsevier Interactive Patient Education  2017 [employer]. Round Ligament Pain The round ligament is a cord of muscle and tissue that helps to support the uterus. It can become a source of pain during pregnancy if it becomes stretched or twisted as the baby grows. The pain usually begins in the second trimester of pregnancy, and it can come and go until the baby is delivered. It is not a serious problem, and it does not cause harm to the baby. Round ligament pain is usually a short, sharp, and pinching pain, but it can also be a dull, lingering, and aching pain. The pain is felt in the lower side of the abdomen or in the groin. It usually starts deep in the groin and moves up to the outside of the hip area. Pain can occur with:  A sudden change in position.  Rolling over in bed.  Coughing or sneezing.  Physical activity.  Follow these instructions at home: Watch your condition for any changes. Take these steps to help with  your pain:  When the pain starts, relax. Then try: ? Sitting down. ? Flexing your knees up to your abdomen. ?  Lying on your side with one pillow under your abdomen and another pillow between your legs. ? Sitting in a warm bath for 15-20 minutes or until the pain goes away.  Take over-the-counter and prescription medicines only as told by your health care provider.  Move slowly when you sit and stand.  Avoid long walks if they cause pain.  Stop or lessen your physical activities if they cause pain.  Contact a health care provider if:  Your pain does not go away with treatment.  You feel pain in your back that you did not have before.  Your medicine is not helping. Get help right away if:  You develop a fever or chills.  You develop uterine contractions.  You develop vaginal bleeding.  You develop nausea or vomiting.  You develop diarrhea.  You have pain when you urinate. This information is not intended to replace advice given to you by your health care provider. Make sure you discuss any questions you have with your health care provider. Document Released: 10/07/2007 Document Revised: 06/05/2015 Document Reviewed: 03/06/2014 Elsevier Interactive Patient Education  [employer].

## 2017-05-20 LAB — CBC
HEMATOCRIT: 33.3 % — AB (ref 34.0–46.6)
Hemoglobin: 10.7 g/dL — ABNORMAL LOW (ref 11.1–15.9)
MCH: 27.5 pg (ref 26.6–33.0)
MCHC: 32.1 g/dL (ref 31.5–35.7)
MCV: 86 fL (ref 79–97)
Platelets: 186 10*3/uL (ref 150–379)
RBC: 3.89 x10E6/uL (ref 3.77–5.28)
RDW: 13.9 % (ref 12.3–15.4)
WBC: 11.6 10*3/uL — AB (ref 3.4–10.8)

## 2017-05-20 LAB — RPR: RPR Ser Ql: NONREACTIVE

## 2017-05-20 LAB — GESTATIONAL GLUCOSE TOLERANCE
GLUCOSE 1 HOUR GTT: 145 mg/dL (ref 65–179)
GLUCOSE 2 HOUR GTT: 116 mg/dL (ref 65–154)
Glucose, Fasting: 73 mg/dL (ref 65–94)
Glucose, GTT - 3 Hour: 99 mg/dL (ref 65–139)

## 2017-05-23 ENCOUNTER — Encounter: Payer: Self-pay | Admitting: Certified Nurse Midwife

## 2017-05-23 DIAGNOSIS — O99019 Anemia complicating pregnancy, unspecified trimester: Secondary | ICD-10-CM | POA: Insufficient documentation

## 2017-05-30 ENCOUNTER — Encounter: Payer: Self-pay | Admitting: Certified Nurse Midwife

## 2017-05-30 ENCOUNTER — Other Ambulatory Visit: Payer: Self-pay

## 2017-05-30 MED ORDER — FERROUS SULFATE 325 (65 FE) MG PO TABS: 325.0000 mg | ORAL_TABLET | Freq: Every day | ORAL | 3 refills | Status: DC

## 2017-06-03 ENCOUNTER — Encounter: Payer: Self-pay | Admitting: Certified Nurse Midwife

## 2017-06-03 ENCOUNTER — Ambulatory Visit (INDEPENDENT_AMBULATORY_CARE_PROVIDER_SITE_OTHER): Payer: Medicaid Other | Admitting: Certified Nurse Midwife

## 2017-06-03 VITALS — BP 86/58 | HR 110 | Wt 200.1 lb

## 2017-06-03 DIAGNOSIS — Z3493 Encounter for supervision of normal pregnancy, unspecified, third trimester: Secondary | ICD-10-CM

## 2017-06-03 LAB — POCT URINALYSIS DIPSTICK
Bilirubin, UA: NEGATIVE
Glucose, UA: NEGATIVE
NITRITE UA: NEGATIVE
ODOR: NEGATIVE
PH UA: 7 (ref 5.0–8.0)
Protein, UA: NEGATIVE
RBC UA: NEGATIVE
Spec Grav, UA: 1.01 (ref 1.010–1.025)
Urobilinogen, UA: 0.2 E.U./dL

## 2017-06-03 NOTE — Progress Notes (Signed)
ROB- no complaints.  

## 2017-06-03 NOTE — Patient Instructions (Signed)

## 2017-06-03 NOTE — Progress Notes (Signed)
ROB,doing well,good fetal movement. Reports neck pain that she has had for the past four years, ambulatory referral to chiropractor. RTC 2 week ROB.  Amy Cabrera,SNM/Demani Weyrauch,CNM

## 2017-06-06 ENCOUNTER — Encounter: Payer: Self-pay | Admitting: Certified Nurse Midwife

## 2017-06-11 ENCOUNTER — Observation Stay
Admission: EM | Admit: 2017-06-11 | Discharge: 2017-06-11 | Disposition: A | Discharge status: Home / Self Care | Payer: Medicaid Other | Attending: Obstetrics and Gynecology | Admitting: Obstetrics and Gynecology

## 2017-06-11 ENCOUNTER — Other Ambulatory Visit: Payer: Self-pay

## 2017-06-11 DIAGNOSIS — R109 Unspecified abdominal pain: Secondary | ICD-10-CM | POA: Diagnosis not present

## 2017-06-11 DIAGNOSIS — O26893 Other specified pregnancy related conditions, third trimester: Secondary | ICD-10-CM | POA: Diagnosis not present

## 2017-06-11 DIAGNOSIS — O99333 Smoking (tobacco) complicating pregnancy, third trimester: Secondary | ICD-10-CM | POA: Diagnosis not present

## 2017-06-11 DIAGNOSIS — R350 Frequency of micturition: Secondary | ICD-10-CM | POA: Insufficient documentation

## 2017-06-11 DIAGNOSIS — M549 Dorsalgia, unspecified: Secondary | ICD-10-CM | POA: Insufficient documentation

## 2017-06-11 DIAGNOSIS — Z3A31 31 weeks gestation of pregnancy: Secondary | ICD-10-CM | POA: Insufficient documentation

## 2017-06-11 DIAGNOSIS — F17200 Nicotine dependence, unspecified, uncomplicated: Secondary | ICD-10-CM | POA: Insufficient documentation

## 2017-06-11 DIAGNOSIS — O99213 Obesity complicating pregnancy, third trimester: Secondary | ICD-10-CM | POA: Diagnosis not present

## 2017-06-11 DIAGNOSIS — Z3490 Encounter for supervision of normal pregnancy, unspecified, unspecified trimester: Secondary | ICD-10-CM

## 2017-06-11 LAB — CBC
HCT: 34 % — ABNORMAL LOW (ref 35.0–47.0)
HEMOGLOBIN: 11.6 g/dL — AB (ref 12.0–16.0)
MCH: 28.7 pg (ref 26.0–34.0)
MCHC: 34 g/dL (ref 32.0–36.0)
MCV: 84.3 fL (ref 80.0–100.0)
Platelets: 174 10*3/uL (ref 150–440)
RBC: 4.04 MIL/uL (ref 3.80–5.20)
RDW: 13 % (ref 11.5–14.5)
WBC: 13.8 10*3/uL — ABNORMAL HIGH (ref 3.6–11.0)

## 2017-06-11 LAB — URINALYSIS, COMPLETE (UACMP) WITH MICROSCOPIC
BILIRUBIN URINE: NEGATIVE
GLUCOSE, UA: NEGATIVE mg/dL
Ketones, ur: 80 mg/dL — AB
Leukocytes, UA: NEGATIVE
NITRITE: NEGATIVE
Protein, ur: NEGATIVE mg/dL
SPECIFIC GRAVITY, URINE: 1.02 (ref 1.005–1.030)
pH: 5 (ref 5.0–8.0)

## 2017-06-11 MED ORDER — LIDOCAINE 5 % EX PTCH
1.0000 | MEDICATED_PATCH | CUTANEOUS | Status: DC
  Administered 2017-06-11: 1 via TRANSDERMAL
  Filled 2017-06-11: qty 1

## 2017-06-11 MED ORDER — NITROFURANTOIN MONOHYD MACRO 100 MG PO CAPS: 100.0000 mg | ORAL_CAPSULE | Freq: Two times a day (BID) | ORAL | 0 refills | Status: AC

## 2017-06-11 MED ORDER — ACETAMINOPHEN 325 MG PO TABS
650.0000 mg | ORAL_TABLET | Freq: Four times a day (QID) | ORAL | Status: DC | PRN
  Administered 2017-06-11: 650 mg via ORAL
  Filled 2017-06-11: qty 2

## 2017-06-11 MED ORDER — CEFTRIAXONE SODIUM 250 MG IJ SOLR
250.0000 mg | Freq: Once | INTRAMUSCULAR | Status: DC
  Filled 2017-06-11: qty 250

## 2017-06-11 NOTE — Discharge Instructions (Signed)
Follow up with your next prenatal appointment

## 2017-06-11 NOTE — OB Triage Note (Signed)
Pt presents to triage with left sided flank pain [place] to come back today.  Pt is in tears and reports that the pain has remained constant all day but comes and goes with varying intensity.  Left side of back is tender to the touch.  Pt reports increased frequency with urination, but no burning.  Pt reports decreased fetal mov't for the last three hours, but reports fetal mov't once EFM and TOCO applied to abdomen.

## 2017-06-13 LAB — URINE CULTURE

## 2017-06-13 NOTE — Discharge Summary (Signed)
Obstetric Discharge Summary  Patient ID: Amy Cabrera MRN: [id number] DOB/AGE: 23/23/1997 23 y.o.   Date of Admission: 06/11/2017 Michelle ,CNM (D. Evans, MD)  Date of Discharge: 06/11/2017 Michelle , CNM (D. Evans, MD)  Admitting Diagnosis: Observation at [redacted]w[redacted]d  Secondary Diagnosis: Smoker, Obesity in pregnancy, History marijuana use     Discharge Diagnosis: No other diagnosis   Antepartum Procedures: NST, CBC   Brief Hospital Course   L&D OB Triage Note  Amy Cabrera is a 22 y.o. G1P0000 female at [redacted]w[redacted]d, EDD Estimated Date of Delivery: 08/11/17 who presented to triage for complaints of right lower back/flank pain and urinary frequency.  She was evaluated by the nurses with no significant findings for labor or fetal distress. Vital signs stable. An NST was performed and has been reviewed by CNM. She was treated with Tylenol and Lidoderm patch.   NST INTERPRETATION: Indications: rule out uterine contractions  Mode: External Baseline Rate (A): 135 bpm Variability: Moderate Accelerations: 15 x 15 Decelerations: None     Contraction Frequency (min): none  Impression: reactive  Urinalysis    Component Value Date/Time   COLORURINE YELLOW (A) 06/11/2017 1721   APPEARANCEUR HAZY (A) 06/11/2017 1721   APPEARANCEUR Clear 02/02/2017 1616   LABSPEC 1.020 06/11/2017 1721   PHURINE 5.0 06/11/2017 1721   GLUCOSEU NEGATIVE 06/11/2017 1721   HGBUR SMALL (A) 06/11/2017 1721   BILIRUBINUR NEGATIVE 06/11/2017 1721   BILIRUBINUR neg 06/03/2017 1343   BILIRUBINUR Negative 02/02/2017 1616   KETONESUR 80 (A) 06/11/2017 1721   PROTEINUR NEGATIVE 06/11/2017 1721   UROBILINOGEN 0.2 06/03/2017 1343   NITRITE NEGATIVE 06/11/2017 1721   LEUKOCYTESUR NEGATIVE 06/11/2017 1721   LEUKOCYTESUR Negative 02/02/2017 1616     Plan: NST performed was reviewed and was found to be reactive. She was discharged home with bleeding/labor precautions.  Continue routine prenatal care.  Follow up with CNM as previously scheduled.     Discharge Instructions: Per After Visit Summary.  Activity:  Also refer to After Visit Summary.  Diet: Regular  Medications: Allergies as of 06/11/2017   No Known Allergies     Medication List    STOP taking these medications   blood glucose meter kit and supplies Kit   glucose blood test strip   oxyCODONE-acetaminophen 5-325 MG tablet Commonly known as:  PERCOCET/ROXICET     TAKE these medications   acetaminophen 500 MG tablet Commonly known as:  TYLENOL Take 500 mg by mouth every 6 (six) hours as needed.   ferrous sulfate 325 (65 FE) MG tablet Take 1 tablet (325 mg total) by mouth daily with breakfast.   nitrofurantoin (macrocrystal-monohydrate) 100 MG capsule Commonly known as:  MACROBID Take 1 capsule (100 mg total) by mouth 2 (two) times daily for 7 days.   prenatal multivitamin Tabs tablet Take 1 tablet by mouth daily at 12 noon.      Outpatient follow up:   Discharged Condition: stable  Discharged to: home   Newborn Data:  Michelle , CN [facility] 

## 2017-06-17 ENCOUNTER — Ambulatory Visit (INDEPENDENT_AMBULATORY_CARE_PROVIDER_SITE_OTHER): Payer: Medicaid Other | Admitting: Certified Nurse Midwife

## 2017-06-17 VITALS — BP 96/54 | HR 79 | Wt 203.4 lb

## 2017-06-17 DIAGNOSIS — Z3493 Encounter for supervision of normal pregnancy, unspecified, third trimester: Secondary | ICD-10-CM | POA: Diagnosis not present

## 2017-06-17 LAB — POCT URINALYSIS DIPSTICK
BILIRUBIN UA: NEGATIVE
Blood, UA: NEGATIVE
GLUCOSE UA: NEGATIVE
Ketones, UA: NEGATIVE
Leukocytes, UA: NEGATIVE
Nitrite, UA: NEGATIVE
Protein, UA: POSITIVE — AB
Spec Grav, UA: 1.025 (ref 1.010–1.025)
Urobilinogen, UA: 0.2 E.U./dL
pH, UA: 6.5 (ref 5.0–8.0)

## 2017-06-17 NOTE — Patient Instructions (Signed)

## 2017-06-17 NOTE — Progress Notes (Signed)
R/OB, doing well. Pt complains of occasional blurry vision. She denies epigastric pain , has a history of headaches denies worsening, swilling 1 + bilaterally. BP today 96/54. Signs and symptoms of pre e negative. Discussed with pt.. Discussed having eye exam if she continues with burry vision. Repeat urine culture today. She denies any symptoms of uti currently. Feels good movement. Follow up 2 wks.   Doreene BurkeAnnie Chris Cripps, CNM

## 2017-06-17 NOTE — Progress Notes (Signed)
Pt is here for an ROB visit. 

## 2017-06-19 ENCOUNTER — Encounter (INDEPENDENT_AMBULATORY_CARE_PROVIDER_SITE_OTHER): Payer: Self-pay

## 2017-06-19 LAB — URINE CULTURE: Organism ID, Bacteria: NO GROWTH

## 2017-07-01 ENCOUNTER — Ambulatory Visit (INDEPENDENT_AMBULATORY_CARE_PROVIDER_SITE_OTHER): Payer: Medicaid Other | Admitting: Certified Nurse Midwife

## 2017-07-01 VITALS — BP 104/62 | HR 107 | Wt 200.1 lb

## 2017-07-01 DIAGNOSIS — Z3493 Encounter for supervision of normal pregnancy, unspecified, third trimester: Secondary | ICD-10-CM

## 2017-07-01 LAB — POCT URINALYSIS DIPSTICK
BILIRUBIN UA: NEGATIVE
Blood, UA: NEGATIVE
Glucose, UA: NEGATIVE
Leukocytes, UA: NEGATIVE
Nitrite, UA: NEGATIVE
PH UA: 6 (ref 5.0–8.0)
PROTEIN UA: NEGATIVE
Spec Grav, UA: 1.02 (ref 1.010–1.025)
Urobilinogen, UA: 0.2 E.U./dL

## 2017-07-01 NOTE — Progress Notes (Signed)
ROB, doing well. Feels good movement. Has occasional cramping less than 6 hr.Anticipatory guidance on GBS testing.  PTL precautions reviewed. Follow up 2 wk  Doreene BurkeAnnie Telisa Ohlsen, CNM

## 2017-07-01 NOTE — Progress Notes (Signed)
Pt is here for an ROB visit. 

## 2017-07-01 NOTE — Patient Instructions (Signed)
Group B Streptococcus Infection During Pregnancy Group B Streptococcus (GBS) is a type of bacteria (Streptococcus agalactiae) that is often found in healthy people, commonly in the rectum, vagina, and intestines. In people who are healthy and not pregnant, the bacteria rarely cause serious illness or complications. However, women who test positive for GBS during pregnancy can pass the bacteria to their baby during childbirth, which can cause serious infection in the baby after birth. Women with GBS may also have infections during their pregnancy or immediately after childbirth, such as such as urinary tract infections (UTIs) or infections of the uterus (uterine infections). Having GBS also increases a woman's risk of complications during pregnancy, such as early (preterm) labor or delivery, miscarriage, or stillbirth. Routine testing (screening) for GBS is recommended for all pregnant women. What increases the risk? You may have a higher risk for GBS infection during pregnancy if you had one during a past pregnancy. What are the signs or symptoms? In most cases, GBS infection does not cause symptoms in pregnant women. Signs and symptoms of a possible GBS-related infection may include:  Labor starting before the 37th week of pregnancy.  A UTI or bladder infection, which may cause: ? Fever. ? Pain or burning during urination. ? Frequent urination.  Fever during labor, along with: ? Bad-smelling discharge. ? Uterine tenderness. ? Rapid heartbeat in the mother, baby, or both.  Rare but serious symptoms of a possible GBS-related infection in women include:  Blood infection (septicemia). This may cause fever, chills, or confusion.  Lung infection (pneumonia). This may cause fever, chills, cough, rapid breathing, difficulty breathing, or chest pain.  Bone, joint, skin, or soft tissue infection.  How is this diagnosed? You may be screened for GBS between week 35 and week 37 of your pregnancy. If  you have symptoms of preterm labor, you may be screened earlier. This condition is diagnosed based on lab test results from:  A swab of fluid from the vagina and rectum.  A urine sample.  How is this treated? This condition is treated with antibiotic medicine. When you go into labor, or as soon as your water breaks (your membranes rupture), you will be given antibiotics through an IV tube. Antibiotics will continue until after you give birth. If you are having a cesarean delivery, you do not need antibiotics unless your membranes have already ruptured. Follow these instructions at home:  Take over-the-counter and prescription medicines only as told by your health care provider.  Take your antibiotic medicine as told by your health care provider. Do not stop taking the antibiotic even if you start to feel better.  Keep all pre-birth (prenatal) visits and follow-up visits as told by your health care provider. This is important. Contact a health care provider if:  You have pain or burning when you urinate.  You have to urinate frequently.  You have a fever or chills.  You develop a bad-smelling vaginal discharge. Get help right away if:  Your membranes rupture.  You go into labor.  You have severe pain in your abdomen.  You have difficulty breathing.  You have chest pain. This information is not intended to replace advice given to you by your health care provider. Make sure you discuss any questions you have with your health care provider. Document Released: 04/06/2007 Document Revised: 07/25/2015 Document Reviewed: 07/24/2015 Elsevier Interactive Patient Education  2018 [employer]  

## 2017-07-18 ENCOUNTER — Ambulatory Visit (INDEPENDENT_AMBULATORY_CARE_PROVIDER_SITE_OTHER): Payer: Medicaid Other | Admitting: Certified Nurse Midwife

## 2017-07-18 ENCOUNTER — Other Ambulatory Visit (HOSPITAL_COMMUNITY)
Admission: RE | Admit: 2017-07-18 | Discharge: 2017-07-18 | Disposition: A | Discharge status: Home / Self Care | Payer: Medicaid Other | Source: Ambulatory Visit | Attending: Certified Nurse Midwife | Admitting: Certified Nurse Midwife

## 2017-07-18 VITALS — BP 111/70 | HR 91 | Wt 205.0 lb

## 2017-07-18 DIAGNOSIS — Z113 Encounter for screening for infections with a predominantly sexual mode of transmission: Secondary | ICD-10-CM

## 2017-07-18 DIAGNOSIS — Z3685 Encounter for antenatal screening for Streptococcus B: Secondary | ICD-10-CM

## 2017-07-18 DIAGNOSIS — Z3A36 36 weeks gestation of pregnancy: Secondary | ICD-10-CM

## 2017-07-18 DIAGNOSIS — Z3493 Encounter for supervision of normal pregnancy, unspecified, third trimester: Secondary | ICD-10-CM

## 2017-07-18 LAB — POCT URINALYSIS DIPSTICK
BILIRUBIN UA: NEGATIVE
Glucose, UA: NEGATIVE
KETONES UA: NEGATIVE
NITRITE UA: NEGATIVE
PH UA: 7.5 (ref 5.0–8.0)
Protein, UA: NEGATIVE
RBC UA: NEGATIVE
Spec Grav, UA: 1.015 (ref 1.010–1.025)
UROBILINOGEN UA: 0.2 U/dL

## 2017-07-18 NOTE — Progress Notes (Signed)
ROB-Doing well, reports uterine irritability and pelvic pressure. Herbal prep handout and sample packing list given. 36 week cultures and SVE today. Anticipatory guidance regarding the course of prenatal care. Reviewed red flag symptoms and when to call. RTC x 1 week for ROB or sooner if needed.

## 2017-07-18 NOTE — Patient Instructions (Signed)
Vaginal Delivery Vaginal delivery means that you will give birth by pushing your baby out of your birth canal (vagina). A team of health care providers will help you before, during, and after vaginal delivery. Birth experiences are unique for every woman and every pregnancy, and birth experiences vary depending on where you choose to give birth. What should I do to prepare for my baby's birth? Before your baby is born, it is important to talk with your health care provider about:  Your labor and delivery preferences. These may include: ? Medicines that you may be given. ? How you will manage your pain. This might include non-medical pain relief techniques or injectable pain relief such as epidural analgesia. ? How you and your baby will be monitored during labor and delivery. ? Who may be in the labor and delivery room with you. ? Your feelings about surgical delivery of your baby (cesarean delivery, or C-section) if this becomes necessary. ? Your feelings about receiving donated blood through an IV tube (blood transfusion) if this becomes necessary.  Whether you are able: ? To take pictures or videos of the birth. ? To eat during labor and delivery. ? To move around, walk, or change positions during labor and delivery.  What to expect after your baby is born, such as: ? Whether delayed umbilical cord clamping and cutting is offered. ? Who will care for your baby right after birth. ? Medicines or tests that may be recommended for your baby. ? Whether breastfeeding is supported in your hospital or birth center. ? How long you will be in the hospital or birth center.  How any medical conditions you have may affect your baby or your labor and delivery experience.  To prepare for your baby's birth, you should also:  Attend all of your health care visits before delivery (prenatal visits) as recommended by your health care provider. This is important.  Prepare your home for your baby's  arrival. Make sure that you have: ? Diapers. ? Baby clothing. ? Feeding equipment. ? Safe sleeping arrangements for you and your baby.  Install a car seat in your vehicle. Have your car seat checked by a certified car seat installer to make sure that it is installed safely.  Think about who will help you with your new baby at home for at least the first several weeks after delivery.  What can I expect when I arrive at the birth center or hospital? Once you are in labor and have been admitted into the hospital or birth center, your health care provider may:  Review your pregnancy history and any concerns you have.  Insert an IV tube into one of your veins. This is used to give you fluids and medicines.  Check your blood pressure, pulse, temperature, and heart rate (vital signs).  Check whether your bag of water (amniotic sac) has broken (ruptured).  Talk with you about your birth plan and discuss pain control options.  Monitoring Your health care provider may monitor your contractions (uterine monitoring) and your baby's heart rate (fetal monitoring). You may need to be monitored:  Often, but not continuously (intermittently).  All the time or for long periods at a time (continuously). Continuous monitoring may be needed if: ? You are taking certain medicines, such as medicine to relieve pain or make your contractions stronger. ? You have pregnancy or labor complications.  Monitoring may be done by:  Placing a special stethoscope or a handheld monitoring device on your abdomen to   check your baby's heartbeat, and feeling your abdomen for contractions. This method of monitoring does not continuously record your baby's heartbeat or your contractions.  Placing monitors on your abdomen (external monitors) to record your baby's heartbeat and the frequency and length of contractions. You may not have to wear external monitors all the time.  Placing monitors inside of your uterus  (internal monitors) to record your baby's heartbeat and the frequency, length, and strength of your contractions. ? Your health care provider may use internal monitors if he or she needs more information about the strength of your contractions or your baby's heart rate. ? Internal monitors are put in place by passing a thin, flexible wire through your vagina and into your uterus. Depending on the type of monitor, it may remain in your uterus or on your baby's head until birth. ? Your health care provider will discuss the benefits and risks of internal monitoring with you and will ask for your permission before inserting the monitors.  Telemetry. This is a type of continuous monitoring that can be done with external or internal monitors. Instead of having to stay in bed, you are able to move around during telemetry. Ask your health care provider if telemetry is an option for you.  Physical exam Your health care provider may perform a physical exam. This may include:  Checking whether your baby is positioned: ? With the head toward your vagina (head-down). This is most common. ? With the head toward the top of your uterus (head-up or breech). If your baby is in a breech position, your health care provider may try to turn your baby to a head-down position so you can deliver vaginally. If it does not seem that your baby can be born vaginally, your provider may recommend surgery to deliver your baby. In rare cases, you may be able to deliver vaginally if your baby is head-up (breech delivery). ? Lying sideways (transverse). Babies that are lying sideways cannot be delivered vaginally.  Checking your cervix to determine: ? Whether it is thinning out (effacing). ? Whether it is opening up (dilating). ? How low your baby has moved into your birth canal.  What are the three stages of labor and delivery?  Normal labor and delivery is divided into the following three stages: Stage 1  Stage 1 is the  longest stage of labor, and it can last for hours or days. Stage 1 includes: ? Early labor. This is when contractions may be irregular, or regular and mild. Generally, early labor contractions are more than 10 minutes apart. ? Active labor. This is when contractions get longer, more regular, more frequent, and more intense. ? The transition phase. This is when contractions happen very close together, are very intense, and may last longer than during any other part of labor.  Contractions generally feel mild, infrequent, and irregular at first. They get stronger, more frequent (about every 2-3 minutes), and more regular as you progress from early labor through active labor and transition.  Many women progress through stage 1 naturally, but you may need help to continue making progress. If this happens, your health care provider may talk with you about: ? Rupturing your amniotic sac if it has not ruptured yet. ? Giving you medicine to help make your contractions stronger and more frequent.  Stage 1 ends when your cervix is completely dilated to 4 inches (10 cm) and completely effaced. This happens at the end of the transition phase. Stage 2  Once   your cervix is completely effaced and dilated to 4 inches (10 cm), you may start to feel an urge to push. It is common for the body to naturally take a rest before feeling the urge to push, especially if you received an epidural or certain other pain medicines. This rest period may last for up to 1-2 hours, depending on your unique labor experience.  During stage 2, contractions are generally less painful, because pushing helps relieve contraction pain. Instead of contraction pain, you may feel stretching and burning pain, especially when the widest part of your baby's head passes through the vaginal opening (crowning).  Your health care provider will closely monitor your pushing progress and your baby's progress through the vagina during stage 2.  Your  health care provider may massage the area of skin between your vaginal opening and anus (perineum) or apply warm compresses to your perineum. This helps it stretch as the baby's head starts to crown, which can help prevent perineal tearing. ? In some cases, an incision may be made in your perineum (episiotomy) to allow the baby to pass through the vaginal opening. An episiotomy helps to make the opening of the vagina larger to allow more room for the baby to fit through.  It is very important to breathe and focus so your health care provider can control the delivery of your baby's head. Your health care provider may have you decrease the intensity of your pushing, to help prevent perineal tearing.  After delivery of your baby's head, the shoulders and the rest of the body generally deliver very quickly and without difficulty.  Once your baby is delivered, the umbilical cord may be cut right away, or this may be delayed for 1-2 minutes, depending on your baby's health. This may vary among health care providers, hospitals, and birth centers.  If you and your baby are healthy enough, your baby may be placed on your chest or abdomen to help maintain the baby's temperature and to help you bond with each other. Some mothers and babies start breastfeeding at this time. Your health care team will dry your baby and help keep your baby warm during this time.  Your baby may need immediate care if he or she: ? Showed signs of distress during labor. ? Has a medical condition. ? Was born too early (prematurely). ? Had a bowel movement before birth (meconium). ? Shows signs of difficulty transitioning from being inside the uterus to being outside of the uterus. If you are planning to breastfeed, your health care team will help you begin a feeding. Stage 3  The third stage of labor starts immediately after the birth of your baby and ends after you deliver the placenta. The placenta is an organ that develops  during pregnancy to provide oxygen and nutrients to your baby in the womb.  Delivering the placenta may require some pushing, and you may have mild contractions. Breastfeeding can stimulate contractions to help you deliver the placenta.  After the placenta is delivered, your uterus should tighten (contract) and become firm. This helps to stop bleeding in your uterus. To help your uterus contract and to control bleeding, your health care provider may: ? Give you medicine by injection, through an IV tube, by mouth, or through your rectum (rectally). ? Massage your abdomen or perform a vaginal exam to remove any blood clots that are left in your uterus. ? Empty your bladder by placing a thin, flexible tube (catheter) into your bladder. ? Encourage   you to breastfeed your baby. After labor is over, you and your baby will be monitored closely to ensure that you are both healthy until you are ready to go home. Your health care team will teach you how to care for yourself and your baby. This information is not intended to replace advice given to you by your health care provider. Make sure you discuss any questions you have with your health care provider. Document Released: 10/07/2007 Document Revised: 07/18/2015 Document Reviewed: 01/12/2015 Elsevier Interactive Patient Education  2018 [employer]  

## 2017-07-18 NOTE — Progress Notes (Signed)
ROB- cultures obtained today, pt is some pelvic pressure some menstrual cramping

## 2017-07-19 LAB — GC/CHLAMYDIA PROBE AMP (~~LOC~~) NOT AT ARMC
Chlamydia: NEGATIVE
Neisseria Gonorrhea: NEGATIVE

## 2017-07-20 LAB — STREP GP B NAA: STREP GROUP B AG: NEGATIVE

## 2017-07-23 ENCOUNTER — Encounter: Payer: Self-pay | Admitting: Certified Nurse Midwife

## 2017-07-25 ENCOUNTER — Encounter: Payer: Self-pay | Admitting: Certified Nurse Midwife

## 2017-07-25 ENCOUNTER — Ambulatory Visit (INDEPENDENT_AMBULATORY_CARE_PROVIDER_SITE_OTHER): Payer: Medicaid Other | Admitting: Certified Nurse Midwife

## 2017-07-25 VITALS — BP 127/77 | HR 112 | Wt 199.7 lb

## 2017-07-25 DIAGNOSIS — Z3493 Encounter for supervision of normal pregnancy, unspecified, third trimester: Secondary | ICD-10-CM

## 2017-07-25 NOTE — Progress Notes (Signed)
ROB, doing well. Feels good movement. Labor precautions reviewed. SVE per pt request. 1 Cm/50/-3. Follow up 1 wk.   Doreene BurkeAnnie Errin Chewning, CNM

## 2017-07-25 NOTE — Progress Notes (Signed)
ROB- pt is having some pelvic pressure, having lower abd cramping

## 2017-07-25 NOTE — Patient Instructions (Signed)
Braxton Hicks Contractions °Contractions of the uterus can occur throughout pregnancy, but they are not always a sign that you are in labor. You may have practice contractions called Braxton Hicks contractions. These false labor contractions are sometimes confused with true labor. °What are Braxton Hicks contractions? °Braxton Hicks contractions are tightening movements that occur in the muscles of the uterus before labor. Unlike true labor contractions, these contractions do not result in opening (dilation) and thinning of the cervix. Toward the end of pregnancy (32-34 weeks), Braxton Hicks contractions can happen more often and may become stronger. These contractions are sometimes difficult to tell apart from true labor because they can be very uncomfortable. You should not feel embarrassed if you go to the hospital with false labor. °Sometimes, the only way to tell if you are in true labor is for your health care provider to look for changes in the cervix. The health care provider will do a physical exam and may monitor your contractions. If you are not in true labor, the exam should show that your cervix is not dilating and your water has not broken. °If there are other health problems associated with your pregnancy, it is completely safe for you to be sent home with false labor. You may continue to have Braxton Hicks contractions until you go into true labor. °How to tell the difference between true labor and false labor °True labor °· Contractions last 30-70 seconds. °· Contractions become very regular. °· Discomfort is usually felt in the top of the uterus, and it spreads to the lower abdomen and low back. °· Contractions do not go away with walking. °· Contractions usually become more intense and increase in frequency. °· The cervix dilates and gets thinner. °False labor °· Contractions are usually shorter and not as strong as true labor contractions. °· Contractions are usually irregular. °· Contractions  are often felt in the front of the lower abdomen and in the groin. °· Contractions may go away when you walk around or change positions while lying down. °· Contractions get weaker and are shorter-lasting as time goes on. °· The cervix usually does not dilate or become thin. °Follow these instructions at home: °· Take over-the-counter and prescription medicines only as told by your health care provider. °· Keep up with your usual exercises and follow other instructions from your health care provider. °· Eat and drink lightly if you think you are going into labor. °· If Braxton Hicks contractions are making you uncomfortable: °? Change your position from lying down or resting to walking, or change from walking to resting. °? Sit and rest in a tub of warm water. °? Drink enough fluid to keep your urine pale yellow. Dehydration may cause these contractions. °? Do slow and deep breathing several times an hour. °· Keep all follow-up prenatal visits as told by your health care provider. This is important. °Contact a health care provider if: °· You have a fever. °· You have continuous pain in your abdomen. °Get help right away if: °· Your contractions become stronger, more regular, and closer together. °· You have fluid leaking or gushing from your vagina. °· You pass blood-tinged mucus (bloody show). °· You have bleeding from your vagina. °· You have low back pain that you never had before. °· You feel your baby’s head pushing down and causing pelvic pressure. °· Your baby is not moving inside you as much as it used to. °Summary °· Contractions that occur before labor are called Braxton   Hicks contractions, false labor, or practice contractions. °· Braxton Hicks contractions are usually shorter, weaker, farther apart, and less regular than true labor contractions. True labor contractions usually become progressively stronger and regular and they become more frequent. °· Manage discomfort from Braxton Hicks contractions by  changing position, resting in a warm bath, drinking plenty of water, or practicing deep breathing. °This information is not intended to replace advice given to you by your health care provider. Make sure you discuss any questions you have with your health care provider. °Document Released: 05/13/2016 Document Revised: 05/13/2016 Document Reviewed: 05/13/2016 °Elsevier Interactive Patient Education © 2018 Elsevier Inc. ° °

## 2017-08-01 ENCOUNTER — Ambulatory Visit (INDEPENDENT_AMBULATORY_CARE_PROVIDER_SITE_OTHER): Payer: Medicaid Other | Admitting: Certified Nurse Midwife

## 2017-08-01 VITALS — BP 111/67 | HR 103 | Wt 203.9 lb

## 2017-08-01 DIAGNOSIS — Z3493 Encounter for supervision of normal pregnancy, unspecified, third trimester: Secondary | ICD-10-CM | POA: Diagnosis not present

## 2017-08-01 LAB — POCT URINALYSIS DIPSTICK
BILIRUBIN UA: NEGATIVE
Blood, UA: NEGATIVE
GLUCOSE UA: NEGATIVE
KETONES UA: NEGATIVE
Leukocytes, UA: NEGATIVE
Nitrite, UA: NEGATIVE
PH UA: 6 (ref 5.0–8.0)
Protein, UA: NEGATIVE
Spec Grav, UA: 1.015 (ref 1.010–1.025)
UROBILINOGEN UA: 0.2 U/dL

## 2017-08-01 NOTE — Progress Notes (Signed)
ROB- pt is having a lot of pelvic pressure 

## 2017-08-01 NOTE — Patient Instructions (Signed)
Vaginal Delivery Vaginal delivery means that you will give birth by pushing your baby out of your birth canal (vagina). A team of health care providers will help you before, during, and after vaginal delivery. Birth experiences are unique for every woman and every pregnancy, and birth experiences vary depending on where you choose to give birth. What should I do to prepare for my baby's birth? Before your baby is born, it is important to talk with your health care provider about:  Your labor and delivery preferences. These may include: ? Medicines that you may be given. ? How you will manage your pain. This might include non-medical pain relief techniques or injectable pain relief such as epidural analgesia. ? How you and your baby will be monitored during labor and delivery. ? Who may be in the labor and delivery room with you. ? Your feelings about surgical delivery of your baby (cesarean delivery, or C-section) if this becomes necessary. ? Your feelings about receiving donated blood through an IV tube (blood transfusion) if this becomes necessary.  Whether you are able: ? To take pictures or videos of the birth. ? To eat during labor and delivery. ? To move around, walk, or change positions during labor and delivery.  What to expect after your baby is born, such as: ? Whether delayed umbilical cord clamping and cutting is offered. ? Who will care for your baby right after birth. ? Medicines or tests that may be recommended for your baby. ? Whether breastfeeding is supported in your hospital or birth center. ? How long you will be in the hospital or birth center.  How any medical conditions you have may affect your baby or your labor and delivery experience.  To prepare for your baby's birth, you should also:  Attend all of your health care visits before delivery (prenatal visits) as recommended by your health care provider. This is important.  Prepare your home for your baby's  arrival. Make sure that you have: ? Diapers. ? Baby clothing. ? Feeding equipment. ? Safe sleeping arrangements for you and your baby.  Install a car seat in your vehicle. Have your car seat checked by a certified car seat installer to make sure that it is installed safely.  Think about who will help you with your new baby at home for at least the first several weeks after delivery.  What can I expect when I arrive at the birth center or hospital? Once you are in labor and have been admitted into the hospital or birth center, your health care provider may:  Review your pregnancy history and any concerns you have.  Insert an IV tube into one of your veins. This is used to give you fluids and medicines.  Check your blood pressure, pulse, temperature, and heart rate (vital signs).  Check whether your bag of water (amniotic sac) has broken (ruptured).  Talk with you about your birth plan and discuss pain control options.  Monitoring Your health care provider may monitor your contractions (uterine monitoring) and your baby's heart rate (fetal monitoring). You may need to be monitored:  Often, but not continuously (intermittently).  All the time or for long periods at a time (continuously). Continuous monitoring may be needed if: ? You are taking certain medicines, such as medicine to relieve pain or make your contractions stronger. ? You have pregnancy or labor complications.  Monitoring may be done by:  Placing a special stethoscope or a handheld monitoring device on your abdomen to   check your baby's heartbeat, and feeling your abdomen for contractions. This method of monitoring does not continuously record your baby's heartbeat or your contractions.  Placing monitors on your abdomen (external monitors) to record your baby's heartbeat and the frequency and length of contractions. You may not have to wear external monitors all the time.  Placing monitors inside of your uterus  (internal monitors) to record your baby's heartbeat and the frequency, length, and strength of your contractions. ? Your health care provider may use internal monitors if he or she needs more information about the strength of your contractions or your baby's heart rate. ? Internal monitors are put in place by passing a thin, flexible wire through your vagina and into your uterus. Depending on the type of monitor, it may remain in your uterus or on your baby's head until birth. ? Your health care provider will discuss the benefits and risks of internal monitoring with you and will ask for your permission before inserting the monitors.  Telemetry. This is a type of continuous monitoring that can be done with external or internal monitors. Instead of having to stay in bed, you are able to move around during telemetry. Ask your health care provider if telemetry is an option for you.  Physical exam Your health care provider may perform a physical exam. This may include:  Checking whether your baby is positioned: ? With the head toward your vagina (head-down). This is most common. ? With the head toward the top of your uterus (head-up or breech). If your baby is in a breech position, your health care provider may try to turn your baby to a head-down position so you can deliver vaginally. If it does not seem that your baby can be born vaginally, your provider may recommend surgery to deliver your baby. In rare cases, you may be able to deliver vaginally if your baby is head-up (breech delivery). ? Lying sideways (transverse). Babies that are lying sideways cannot be delivered vaginally.  Checking your cervix to determine: ? Whether it is thinning out (effacing). ? Whether it is opening up (dilating). ? How low your baby has moved into your birth canal.  What are the three stages of labor and delivery?  Normal labor and delivery is divided into the following three stages: Stage 1  Stage 1 is the  longest stage of labor, and it can last for hours or days. Stage 1 includes: ? Early labor. This is when contractions may be irregular, or regular and mild. Generally, early labor contractions are more than 10 minutes apart. ? Active labor. This is when contractions get longer, more regular, more frequent, and more intense. ? The transition phase. This is when contractions happen very close together, are very intense, and may last longer than during any other part of labor.  Contractions generally feel mild, infrequent, and irregular at first. They get stronger, more frequent (about every 2-3 minutes), and more regular as you progress from early labor through active labor and transition.  Many women progress through stage 1 naturally, but you may need help to continue making progress. If this happens, your health care provider may talk with you about: ? Rupturing your amniotic sac if it has not ruptured yet. ? Giving you medicine to help make your contractions stronger and more frequent.  Stage 1 ends when your cervix is completely dilated to 4 inches (10 cm) and completely effaced. This happens at the end of the transition phase. Stage 2  Once   your cervix is completely effaced and dilated to 4 inches (10 cm), you may start to feel an urge to push. It is common for the body to naturally take a rest before feeling the urge to push, especially if you received an epidural or certain other pain medicines. This rest period may last for up to 1-2 hours, depending on your unique labor experience.  During stage 2, contractions are generally less painful, because pushing helps relieve contraction pain. Instead of contraction pain, you may feel stretching and burning pain, especially when the widest part of your baby's head passes through the vaginal opening (crowning).  Your health care provider will closely monitor your pushing progress and your baby's progress through the vagina during stage 2.  Your  health care provider may massage the area of skin between your vaginal opening and anus (perineum) or apply warm compresses to your perineum. This helps it stretch as the baby's head starts to crown, which can help prevent perineal tearing. ? In some cases, an incision may be made in your perineum (episiotomy) to allow the baby to pass through the vaginal opening. An episiotomy helps to make the opening of the vagina larger to allow more room for the baby to fit through.  It is very important to breathe and focus so your health care provider can control the delivery of your baby's head. Your health care provider may have you decrease the intensity of your pushing, to help prevent perineal tearing.  After delivery of your baby's head, the shoulders and the rest of the body generally deliver very quickly and without difficulty.  Once your baby is delivered, the umbilical cord may be cut right away, or this may be delayed for 1-2 minutes, depending on your baby's health. This may vary among health care providers, hospitals, and birth centers.  If you and your baby are healthy enough, your baby may be placed on your chest or abdomen to help maintain the baby's temperature and to help you bond with each other. Some mothers and babies start breastfeeding at this time. Your health care team will dry your baby and help keep your baby warm during this time.  Your baby may need immediate care if he or she: ? Showed signs of distress during labor. ? Has a medical condition. ? Was born too early (prematurely). ? Had a bowel movement before birth (meconium). ? Shows signs of difficulty transitioning from being inside the uterus to being outside of the uterus. If you are planning to breastfeed, your health care team will help you begin a feeding. Stage 3  The third stage of labor starts immediately after the birth of your baby and ends after you deliver the placenta. The placenta is an organ that develops  during pregnancy to provide oxygen and nutrients to your baby in the womb.  Delivering the placenta may require some pushing, and you may have mild contractions. Breastfeeding can stimulate contractions to help you deliver the placenta.  After the placenta is delivered, your uterus should tighten (contract) and become firm. This helps to stop bleeding in your uterus. To help your uterus contract and to control bleeding, your health care provider may: ? Give you medicine by injection, through an IV tube, by mouth, or through your rectum (rectally). ? Massage your abdomen or perform a vaginal exam to remove any blood clots that are left in your uterus. ? Empty your bladder by placing a thin, flexible tube (catheter) into your bladder. ? Encourage   you to breastfeed your baby. After labor is over, you and your baby will be monitored closely to ensure that you are both healthy until you are ready to go home. Your health care team will teach you how to care for yourself and your baby. This information is not intended to replace advice given to you by your health care provider. Make sure you discuss any questions you have with your health care provider. Document Released: 10/07/2007 Document Revised: 07/18/2015 Document Reviewed: 01/12/2015 Elsevier Interactive Patient Education  2018 [employer]  

## 2017-08-02 NOTE — Progress Notes (Signed)
ROB-Reports increased pelvic pressure. Requests SVE; unchanged from previous visit. Anticipatory guidance regarding course of prenatal care. Reviewed red flag symptoms and when to call. RTC x 1 week for ROB or sooner if needed.

## 2017-08-08 ENCOUNTER — Ambulatory Visit (INDEPENDENT_AMBULATORY_CARE_PROVIDER_SITE_OTHER): Payer: Medicaid Other | Admitting: Certified Nurse Midwife

## 2017-08-08 VITALS — BP 111/68 | HR 86 | Wt 203.0 lb

## 2017-08-08 DIAGNOSIS — Z3493 Encounter for supervision of normal pregnancy, unspecified, third trimester: Secondary | ICD-10-CM

## 2017-08-08 LAB — POCT URINALYSIS DIPSTICK
BILIRUBIN UA: NEGATIVE
GLUCOSE UA: NEGATIVE
Ketones, UA: NEGATIVE
LEUKOCYTES UA: NEGATIVE
Nitrite, UA: NEGATIVE
Protein, UA: NEGATIVE
RBC UA: NEGATIVE
Spec Grav, UA: <=1.005 — AB (ref 1.010–1.025)
Urobilinogen, UA: 0.2 E.U./dL
pH, UA: 6.5 (ref 5.0–8.0)

## 2017-08-08 NOTE — Patient Instructions (Signed)
Braxton Hicks Contractions °Contractions of the uterus can occur throughout pregnancy, but they are not always a sign that you are in labor. You may have practice contractions called Braxton Hicks contractions. These false labor contractions are sometimes confused with true labor. °What are Braxton Hicks contractions? °Braxton Hicks contractions are tightening movements that occur in the muscles of the uterus before labor. Unlike true labor contractions, these contractions do not result in opening (dilation) and thinning of the cervix. Toward the end of pregnancy (32-34 weeks), Braxton Hicks contractions can happen more often and may become stronger. These contractions are sometimes difficult to tell apart from true labor because they can be very uncomfortable. You should not feel embarrassed if you go to the hospital with false labor. °Sometimes, the only way to tell if you are in true labor is for your health care provider to look for changes in the cervix. The health care provider will do a physical exam and may monitor your contractions. If you are not in true labor, the exam should show that your cervix is not dilating and your water has not broken. °If there are other health problems associated with your pregnancy, it is completely safe for you to be sent home with false labor. You may continue to have Braxton Hicks contractions until you go into true labor. °How to tell the difference between true labor and false labor °True labor °· Contractions last 30-70 seconds. °· Contractions become very regular. °· Discomfort is usually felt in the top of the uterus, and it spreads to the lower abdomen and low back. °· Contractions do not go away with walking. °· Contractions usually become more intense and increase in frequency. °· The cervix dilates and gets thinner. °False labor °· Contractions are usually shorter and not as strong as true labor contractions. °· Contractions are usually irregular. °· Contractions  are often felt in the front of the lower abdomen and in the groin. °· Contractions may go away when you walk around or change positions while lying down. °· Contractions get weaker and are shorter-lasting as time goes on. °· The cervix usually does not dilate or become thin. °Follow these instructions at home: °· Take over-the-counter and prescription medicines only as told by your health care provider. °· Keep up with your usual exercises and follow other instructions from your health care provider. °· Eat and drink lightly if you think you are going into labor. °· If Braxton Hicks contractions are making you uncomfortable: °? Change your position from lying down or resting to walking, or change from walking to resting. °? Sit and rest in a tub of warm water. °? Drink enough fluid to keep your urine pale yellow. Dehydration may cause these contractions. °? Do slow and deep breathing several times an hour. °· Keep all follow-up prenatal visits as told by your health care provider. This is important. °Contact a health care provider if: °· You have a fever. °· You have continuous pain in your abdomen. °Get help right away if: °· Your contractions become stronger, more regular, and closer together. °· You have fluid leaking or gushing from your vagina. °· You pass blood-tinged mucus (bloody show). °· You have bleeding from your vagina. °· You have low back pain that you never had before. °· You feel your baby’s head pushing down and causing pelvic pressure. °· Your baby is not moving inside you as much as it used to. °Summary °· Contractions that occur before labor are called Braxton   Hicks contractions, false labor, or practice contractions. °· Braxton Hicks contractions are usually shorter, weaker, farther apart, and less regular than true labor contractions. True labor contractions usually become progressively stronger and regular and they become more frequent. °· Manage discomfort from Braxton Hicks contractions by  changing position, resting in a warm bath, drinking plenty of water, or practicing deep breathing. °This information is not intended to replace advice given to you by your health care provider. Make sure you discuss any questions you have with your health care provider. °Document Released: 05/13/2016 Document Revised: 05/13/2016 Document Reviewed: 05/13/2016 °Elsevier Interactive Patient Education © 2018 Elsevier Inc. ° °

## 2017-08-08 NOTE — Progress Notes (Signed)
ROB, doing well. Feels good movement. Discussed induction @ 41wks. Follow up on Friday for BPP and ROB with Lorn JunesAnnie  Maxene Byington, CNM

## 2017-08-10 ENCOUNTER — Ambulatory Visit (INDEPENDENT_AMBULATORY_CARE_PROVIDER_SITE_OTHER): Payer: Medicaid Other

## 2017-08-10 ENCOUNTER — Other Ambulatory Visit: Payer: Self-pay | Admitting: Certified Nurse Midwife

## 2017-08-10 DIAGNOSIS — O0993 Supervision of high risk pregnancy, unspecified, third trimester: Secondary | ICD-10-CM

## 2017-08-10 DIAGNOSIS — Z3A39 39 weeks gestation of pregnancy: Secondary | ICD-10-CM | POA: Diagnosis not present

## 2017-08-10 DIAGNOSIS — Z3403 Encounter for supervision of normal first pregnancy, third trimester: Secondary | ICD-10-CM | POA: Diagnosis not present

## 2017-08-11 ENCOUNTER — Observation Stay
Admission: EM | Admit: 2017-08-11 | Discharge: 2017-08-11 | Disposition: A | Discharge status: Home / Self Care | Payer: Medicaid Other | Attending: Certified Nurse Midwife | Admitting: Certified Nurse Midwife

## 2017-08-11 ENCOUNTER — Other Ambulatory Visit: Payer: Medicaid Other

## 2017-08-11 ENCOUNTER — Ambulatory Visit (INDEPENDENT_AMBULATORY_CARE_PROVIDER_SITE_OTHER): Payer: Medicaid Other | Admitting: Certified Nurse Midwife

## 2017-08-11 ENCOUNTER — Encounter (INDEPENDENT_AMBULATORY_CARE_PROVIDER_SITE_OTHER): Payer: Self-pay

## 2017-08-11 VITALS — BP 110/63 | HR 79 | Wt 201.2 lb

## 2017-08-11 DIAGNOSIS — Z3A40 40 weeks gestation of pregnancy: Secondary | ICD-10-CM | POA: Diagnosis not present

## 2017-08-11 DIAGNOSIS — O471 False labor at or after 37 completed weeks of gestation: Principal | ICD-10-CM | POA: Insufficient documentation

## 2017-08-11 DIAGNOSIS — Z3493 Encounter for supervision of normal pregnancy, unspecified, third trimester: Secondary | ICD-10-CM | POA: Diagnosis not present

## 2017-08-11 HISTORY — DX: Other specified health status: Z78.9

## 2017-08-11 LAB — POCT URINALYSIS DIPSTICK
Bilirubin, UA: NEGATIVE
Glucose, UA: NEGATIVE
Ketones, UA: NEGATIVE
LEUKOCYTES UA: NEGATIVE
NITRITE UA: NEGATIVE
PH UA: 7.5 (ref 5.0–8.0)
PROTEIN UA: POSITIVE — AB
RBC UA: NEGATIVE
SPEC GRAV UA: 1.01 (ref 1.010–1.025)
Urobilinogen, UA: 0.2 E.U./dL

## 2017-08-11 LAB — ROM PLUS (ARMC ONLY): ROM PLUS: NEGATIVE

## 2017-08-11 NOTE — OB Triage Note (Signed)
Pt is a G1P0  At 5043w0d that [facility] and ROM plus to r/o SROM. Pt denies VB and states positive FM. Pt states she has a headache rated 4/10 and blurred vision. Initial BP 107/66. Reflexes +2 and no clonus. Initial FHT 145 with monitors applied and assessing.

## 2017-08-11 NOTE — Patient Instructions (Signed)
Vaginal Delivery Vaginal delivery means that you will give birth by pushing your baby out of your birth canal (vagina). A team of health care providers will help you before, during, and after vaginal delivery. Birth experiences are unique for every woman and every pregnancy, and birth experiences vary depending on where you choose to give birth. What should I do to prepare for my baby's birth? Before your baby is born, it is important to talk with your health care provider about:  Your labor and delivery preferences. These may include: ? Medicines that you may be given. ? How you will manage your pain. This might include non-medical pain relief techniques or injectable pain relief such as epidural analgesia. ? How you and your baby will be monitored during labor and delivery. ? Who may be in the labor and delivery room with you. ? Your feelings about surgical delivery of your baby (cesarean delivery, or C-section) if this becomes necessary. ? Your feelings about receiving donated blood through an IV tube (blood transfusion) if this becomes necessary.  Whether you are able: ? To take pictures or videos of the birth. ? To eat during labor and delivery. ? To move around, walk, or change positions during labor and delivery.  What to expect after your baby is born, such as: ? Whether delayed umbilical cord clamping and cutting is offered. ? Who will care for your baby right after birth. ? Medicines or tests that may be recommended for your baby. ? Whether breastfeeding is supported in your hospital or birth center. ? How long you will be in the hospital or birth center.  How any medical conditions you have may affect your baby or your labor and delivery experience.  To prepare for your baby's birth, you should also:  Attend all of your health care visits before delivery (prenatal visits) as recommended by your health care provider. This is important.  Prepare your home for your baby's  arrival. Make sure that you have: ? Diapers. ? Baby clothing. ? Feeding equipment. ? Safe sleeping arrangements for you and your baby.  Install a car seat in your vehicle. Have your car seat checked by a certified car seat installer to make sure that it is installed safely.  Think about who will help you with your new baby at home for at least the first several weeks after delivery.  What can I expect when I arrive at the birth center or hospital? Once you are in labor and have been admitted into the hospital or birth center, your health care provider may:  Review your pregnancy history and any concerns you have.  Insert an IV tube into one of your veins. This is used to give you fluids and medicines.  Check your blood pressure, pulse, temperature, and heart rate (vital signs).  Check whether your bag of water (amniotic sac) has broken (ruptured).  Talk with you about your birth plan and discuss pain control options.  Monitoring Your health care provider may monitor your contractions (uterine monitoring) and your baby's heart rate (fetal monitoring). You may need to be monitored:  Often, but not continuously (intermittently).  All the time or for long periods at a time (continuously). Continuous monitoring may be needed if: ? You are taking certain medicines, such as medicine to relieve pain or make your contractions stronger. ? You have pregnancy or labor complications.  Monitoring may be done by:  Placing a special stethoscope or a handheld monitoring device on your abdomen to   check your baby's heartbeat, and feeling your abdomen for contractions. This method of monitoring does not continuously record your baby's heartbeat or your contractions.  Placing monitors on your abdomen (external monitors) to record your baby's heartbeat and the frequency and length of contractions. You may not have to wear external monitors all the time.  Placing monitors inside of your uterus  (internal monitors) to record your baby's heartbeat and the frequency, length, and strength of your contractions. ? Your health care provider may use internal monitors if he or she needs more information about the strength of your contractions or your baby's heart rate. ? Internal monitors are put in place by passing a thin, flexible wire through your vagina and into your uterus. Depending on the type of monitor, it may remain in your uterus or on your baby's head until birth. ? Your health care provider will discuss the benefits and risks of internal monitoring with you and will ask for your permission before inserting the monitors.  Telemetry. This is a type of continuous monitoring that can be done with external or internal monitors. Instead of having to stay in bed, you are able to move around during telemetry. Ask your health care provider if telemetry is an option for you.  Physical exam Your health care provider may perform a physical exam. This may include:  Checking whether your baby is positioned: ? With the head toward your vagina (head-down). This is most common. ? With the head toward the top of your uterus (head-up or breech). If your baby is in a breech position, your health care provider may try to turn your baby to a head-down position so you can deliver vaginally. If it does not seem that your baby can be born vaginally, your provider may recommend surgery to deliver your baby. In rare cases, you may be able to deliver vaginally if your baby is head-up (breech delivery). ? Lying sideways (transverse). Babies that are lying sideways cannot be delivered vaginally.  Checking your cervix to determine: ? Whether it is thinning out (effacing). ? Whether it is opening up (dilating). ? How low your baby has moved into your birth canal.  What are the three stages of labor and delivery?  Normal labor and delivery is divided into the following three stages: Stage 1  Stage 1 is the  longest stage of labor, and it can last for hours or days. Stage 1 includes: ? Early labor. This is when contractions may be irregular, or regular and mild. Generally, early labor contractions are more than 10 minutes apart. ? Active labor. This is when contractions get longer, more regular, more frequent, and more intense. ? The transition phase. This is when contractions happen very close together, are very intense, and may last longer than during any other part of labor.  Contractions generally feel mild, infrequent, and irregular at first. They get stronger, more frequent (about every 2-3 minutes), and more regular as you progress from early labor through active labor and transition.  Many women progress through stage 1 naturally, but you may need help to continue making progress. If this happens, your health care provider may talk with you about: ? Rupturing your amniotic sac if it has not ruptured yet. ? Giving you medicine to help make your contractions stronger and more frequent.  Stage 1 ends when your cervix is completely dilated to 4 inches (10 cm) and completely effaced. This happens at the end of the transition phase. Stage 2  Once   your cervix is completely effaced and dilated to 4 inches (10 cm), you may start to feel an urge to push. It is common for the body to naturally take a rest before feeling the urge to push, especially if you received an epidural or certain other pain medicines. This rest period may last for up to 1-2 hours, depending on your unique labor experience.  During stage 2, contractions are generally less painful, because pushing helps relieve contraction pain. Instead of contraction pain, you may feel stretching and burning pain, especially when the widest part of your baby's head passes through the vaginal opening (crowning).  Your health care provider will closely monitor your pushing progress and your baby's progress through the vagina during stage 2.  Your  health care provider may massage the area of skin between your vaginal opening and anus (perineum) or apply warm compresses to your perineum. This helps it stretch as the baby's head starts to crown, which can help prevent perineal tearing. ? In some cases, an incision may be made in your perineum (episiotomy) to allow the baby to pass through the vaginal opening. An episiotomy helps to make the opening of the vagina larger to allow more room for the baby to fit through.  It is very important to breathe and focus so your health care provider can control the delivery of your baby's head. Your health care provider may have you decrease the intensity of your pushing, to help prevent perineal tearing.  After delivery of your baby's head, the shoulders and the rest of the body generally deliver very quickly and without difficulty.  Once your baby is delivered, the umbilical cord may be cut right away, or this may be delayed for 1-2 minutes, depending on your baby's health. This may vary among health care providers, hospitals, and birth centers.  If you and your baby are healthy enough, your baby may be placed on your chest or abdomen to help maintain the baby's temperature and to help you bond with each other. Some mothers and babies start breastfeeding at this time. Your health care team will dry your baby and help keep your baby warm during this time.  Your baby may need immediate care if he or she: ? Showed signs of distress during labor. ? Has a medical condition. ? Was born too early (prematurely). ? Had a bowel movement before birth (meconium). ? Shows signs of difficulty transitioning from being inside the uterus to being outside of the uterus. If you are planning to breastfeed, your health care team will help you begin a feeding. Stage 3  The third stage of labor starts immediately after the birth of your baby and ends after you deliver the placenta. The placenta is an organ that develops  during pregnancy to provide oxygen and nutrients to your baby in the womb.  Delivering the placenta may require some pushing, and you may have mild contractions. Breastfeeding can stimulate contractions to help you deliver the placenta.  After the placenta is delivered, your uterus should tighten (contract) and become firm. This helps to stop bleeding in your uterus. To help your uterus contract and to control bleeding, your health care provider may: ? Give you medicine by injection, through an IV tube, by mouth, or through your rectum (rectally). ? Massage your abdomen or perform a vaginal exam to remove any blood clots that are left in your uterus. ? Empty your bladder by placing a thin, flexible tube (catheter) into your bladder. ? Encourage   you to breastfeed your baby. After labor is over, you and your baby will be monitored closely to ensure that you are both healthy until you are ready to go home. Your health care team will teach you how to care for yourself and your baby. This information is not intended to replace advice given to you by your health care provider. Make sure you discuss any questions you have with your health care provider. Document Released: 10/07/2007 Document Revised: 07/18/2015 Document Reviewed: 01/12/2015 Elsevier Interactive Patient Education  2018 [employer]  

## 2017-08-11 NOTE — OB Triage Note (Signed)
   L&D OB Triage Note  SUBJECTIVE Amy Cabrera is a [age] y.o. G1P0000 female at 6232w0d, EDD Estimated Date of Delivery: 08/11/17 who presented to triage with complaints of leaking of fluid. Seen in office today. Fern negative, Nitrazine negative. Positive pooling.   OB History  Gravida Para Term Preterm AB Living  1 0 0 0 0 0  SAB TAB Ectopic Multiple Live Births  0 0 0 0 0    # Outcome Date GA Lbr Len/2nd Weight Sex Delivery Anes PTL Lv  1 Current             Medications Prior to Admission  Medication Sig Dispense Refill Last Dose  . acetaminophen (TYLENOL) 500 MG tablet Take 500 mg by mouth every 6 (six) hours as needed.   08/10/2017 at Unknown time  . Prenatal Vit-Fe Fumarate-FA (PRENATAL MULTIVITAMIN) TABS tablet Take 1 tablet by mouth daily at 12 noon.   08/10/2017 at Unknown time  . ferrous sulfate 325 (65 FE) MG tablet Take 1 tablet (325 mg total) by mouth daily with breakfast. (Patient not taking: Reported on 06/11/2017) 30 tablet 3 Not Taking at Unknown time     OBJECTIVE  Nursing Evaluation:   BP 107/66 (BP Location: Right Arm)   Pulse 100   Temp 98.6 F (37 C) (Oral)   Resp 16   Ht 5\' 6"  (1.676 m)   Wt 201 lb (91.2 kg)   LMP 11/04/2016 (Exact Date)   BMI 32.44 kg/m    Findings:   ROM plus- negative   NST was performed and has been reviewed by me.  NST INTERPRETATION: Category I  Mode: External Baseline Rate (A): 148 bpm Variability: Moderate Accelerations: 15 x 15 Decelerations: None     Contraction Frequency (min): none  ASSESSMENT Impression:  1.  Pregnancy:  G1P0000 at 5532w0d , EDD Estimated Date of Delivery: 08/11/17 2.  NST:  Category I 3. Intact:  PLANROM Plus ([facility] only)  Order: [id number]  Status:  Final result Visible to patient:  No (Not Released) Next appt:  08/15/2017 at 10:00 AM in Radiology [facility])  Component 12:21  Rom Plus NEGATIVE   Comment: Performed at [facility], [address] [place], [place] [id number]   Resulting Agency [facility]        1. Reassurance given 2. Discharge home with standard labor precautions given to return to L&D or call the office for problems. 3. Continue routine prenatal care.    Doreene BurkeAnnie Lucienne Sawyers, CNM

## 2017-08-11 NOTE — Progress Notes (Signed)
ROB-Reports leakage of fluid since last week. Positive pooling. Negative fern and Nitrazine. Request SVE, unchanged from previous visit. Discussed postdates care and IOL at 41 weeks. Reviewed red flag symptoms and when to call. RTC x Monday for BPP and ROB. [facility] Am[facility] and NST. Reports called to Consulting civil engineerCharge RN and on call midwife.

## 2017-08-11 NOTE — Progress Notes (Signed)
Pt is here for an ROB visit. To discuss induction. Spotted after internal last visit. Would like to be checked.

## 2017-08-13 ENCOUNTER — Other Ambulatory Visit: Payer: Self-pay

## 2017-08-13 ENCOUNTER — Inpatient Hospital Stay: Payer: Medicaid Other | Admitting: Certified Registered"

## 2017-08-13 ENCOUNTER — Encounter
Admission: EM | Disposition: A | Discharge status: Home / Self Care | Payer: Self-pay | Source: Home / Self Care | Attending: Certified Nurse Midwife

## 2017-08-13 ENCOUNTER — Encounter: Payer: Self-pay | Admitting: *Deleted

## 2017-08-13 ENCOUNTER — Inpatient Hospital Stay
Admission: EM | Admit: 2017-08-13 | Discharge: 2017-08-16 | DRG: 788 | Disposition: A | Discharge status: Home / Self Care | Payer: Medicaid Other | Attending: Certified Nurse Midwife | Admitting: Certified Nurse Midwife

## 2017-08-13 DIAGNOSIS — Z3483 Encounter for supervision of other normal pregnancy, third trimester: Secondary | ICD-10-CM | POA: Diagnosis present

## 2017-08-13 DIAGNOSIS — O4202 Full-term premature rupture of membranes, onset of labor within 24 hours of rupture: Secondary | ICD-10-CM | POA: Diagnosis not present

## 2017-08-13 DIAGNOSIS — D649 Anemia, unspecified: Secondary | ICD-10-CM | POA: Diagnosis present

## 2017-08-13 DIAGNOSIS — Z3A40 40 weeks gestation of pregnancy: Secondary | ICD-10-CM

## 2017-08-13 DIAGNOSIS — O9902 Anemia complicating childbirth: Secondary | ICD-10-CM | POA: Diagnosis present

## 2017-08-13 DIAGNOSIS — O34211 Maternal care for low transverse scar from previous cesarean delivery: Principal | ICD-10-CM | POA: Diagnosis present

## 2017-08-13 DIAGNOSIS — O26893 Other specified pregnancy related conditions, third trimester: Secondary | ICD-10-CM | POA: Diagnosis present

## 2017-08-13 DIAGNOSIS — O480 Post-term pregnancy: Secondary | ICD-10-CM | POA: Diagnosis not present

## 2017-08-13 LAB — TYPE AND SCREEN
ABO/RH(D): O POS
Antibody Screen: NEGATIVE

## 2017-08-13 LAB — CBC WITH DIFFERENTIAL/PLATELET
BASOS ABS: 0.1 10*3/uL (ref 0–0.1)
BASOS PCT: 0 %
EOS PCT: 0 %
Eosinophils Absolute: 0 10*3/uL (ref 0–0.7)
HCT: 36.6 % (ref 35.0–47.0)
Hemoglobin: 12.5 g/dL (ref 12.0–16.0)
Lymphocytes Relative: 5 %
Lymphs Abs: 1.4 10*3/uL (ref 1.0–3.6)
MCH: 28.5 pg (ref 26.0–34.0)
MCHC: 34.2 g/dL (ref 32.0–36.0)
MCV: 83.4 fL (ref 80.0–100.0)
MONO ABS: 1.1 10*3/uL — AB (ref 0.2–0.9)
Monocytes Relative: 4 %
NEUTROS ABS: 25.1 10*3/uL — AB (ref 1.4–6.5)
Neutrophils Relative %: 91 %
PLATELETS: 145 10*3/uL — AB (ref 150–440)
RBC: 4.39 MIL/uL (ref 3.80–5.20)
RDW: 12.5 % (ref 11.5–14.5)
WBC: 27.7 10*3/uL — ABNORMAL HIGH (ref 3.6–11.0)

## 2017-08-13 LAB — URINE DRUG SCREEN, QUALITATIVE (ARMC ONLY)
Amphetamines, Ur Screen: NOT DETECTED
Barbiturates, Ur Screen: NOT DETECTED
Cannabinoid 50 Ng, Ur ~~LOC~~: NOT DETECTED
Cocaine Metabolite,Ur ~~LOC~~: NOT DETECTED
MDMA (ECSTASY) UR SCREEN: NOT DETECTED
METHADONE SCREEN, URINE: NOT DETECTED
Opiate, Ur Screen: NOT DETECTED
PHENCYCLIDINE (PCP) UR S: NOT DETECTED
TRICYCLIC, UR SCREEN: NOT DETECTED

## 2017-08-13 LAB — CBC
HCT: 37.3 % (ref 35.0–47.0)
HCT: 36.6 % (ref 35.0–47.0)
Hemoglobin: 12.7 g/dL (ref 12.0–16.0)
Hemoglobin: 12.5 g/dL (ref 12.0–16.0)
MCH: 28.3 pg (ref 26.0–34.0)
MCH: 28.6 pg (ref 26.0–34.0)
MCHC: 34 g/dL (ref 32.0–36.0)
MCHC: 34.3 g/dL (ref 32.0–36.0)
MCV: 83 fL (ref 80.0–100.0)
MCV: 83.3 fL (ref 80.0–100.0)
PLATELETS: 168 10*3/uL (ref 150–440)
PLATELETS: 166 10*3/uL (ref 150–440)
RBC: 4.49 MIL/uL (ref 3.80–5.20)
RBC: 4.39 MIL/uL (ref 3.80–5.20)
RDW: 12.6 % (ref 11.5–14.5)
RDW: 12.7 % (ref 11.5–14.5)
WBC: 25.3 10*3/uL — ABNORMAL HIGH (ref 3.6–11.0)
WBC: 27.1 10*3/uL — AB (ref 3.6–11.0)

## 2017-08-13 LAB — ROM PLUS (ARMC ONLY): Rom Plus: NEGATIVE

## 2017-08-13 SURGERY — Surgical Case
Anesthesia: General | Site: Abdomen | Wound class: Clean Contaminated

## 2017-08-13 MED ORDER — PROPOFOL 10 MG/ML IV BOLUS
INTRAVENOUS | Status: DC | PRN
  Administered 2017-08-13: 200 mg via INTRAVENOUS

## 2017-08-13 MED ORDER — ONDANSETRON HCL 4 MG/2ML IJ SOLN: 4.0000 mg | Freq: Once | INTRAMUSCULAR | Status: DC | PRN

## 2017-08-13 MED ORDER — FENTANYL CITRATE (PF) 100 MCG/2ML IJ SOLN
INTRAMUSCULAR | Status: AC
  Administered 2017-08-14: 25 ug via INTRAVENOUS
  Filled 2017-08-13: qty 2

## 2017-08-13 MED ORDER — SUCCINYLCHOLINE CHLORIDE 20 MG/ML IJ SOLN
INTRAMUSCULAR | Status: AC
  Filled 2017-08-13: qty 1

## 2017-08-13 MED ORDER — PROPOFOL 10 MG/ML IV BOLUS
INTRAVENOUS | Status: AC
  Filled 2017-08-13: qty 20

## 2017-08-13 MED ORDER — FENTANYL CITRATE (PF) 100 MCG/2ML IJ SOLN
INTRAMUSCULAR | Status: DC | PRN
  Administered 2017-08-13: 100 ug via INTRAVENOUS

## 2017-08-13 MED ORDER — KETAMINE HCL 50 MG/ML IJ SOLN
INTRAMUSCULAR | Status: AC
  Filled 2017-08-13: qty 10

## 2017-08-13 MED ORDER — BUTORPHANOL TARTRATE 2 MG/ML IJ SOLN
1.0000 mg | INTRAMUSCULAR | Status: DC | PRN
  Administered 2017-08-13: 1 mg via INTRAVENOUS
  Filled 2017-08-13: qty 1

## 2017-08-13 MED ORDER — SUCCINYLCHOLINE CHLORIDE 20 MG/ML IJ SOLN
INTRAMUSCULAR | Status: DC | PRN
  Administered 2017-08-13: 120 mg via INTRAVENOUS

## 2017-08-13 MED ORDER — OXYTOCIN BOLUS FROM INFUSION: 500.0000 mL | Freq: Once | INTRAVENOUS | Status: DC

## 2017-08-13 MED ORDER — ONDANSETRON HCL 4 MG/2ML IJ SOLN
INTRAMUSCULAR | Status: AC
  Filled 2017-08-13: qty 2

## 2017-08-13 MED ORDER — SOD CITRATE-CITRIC ACID 500-334 MG/5ML PO SOLN
30.0000 mL | ORAL | Status: DC | PRN
  Administered 2017-08-13: 30 mL via ORAL
  Filled 2017-08-13: qty 15

## 2017-08-13 MED ORDER — FENTANYL CITRATE (PF) 100 MCG/2ML IJ SOLN
50.0000 ug | Freq: Once | INTRAMUSCULAR | Status: AC
  Administered 2017-08-13: 50 ug via INTRAVENOUS
  Filled 2017-08-13: qty 2

## 2017-08-13 MED ORDER — EPHEDRINE SULFATE 50 MG/ML IJ SOLN
INTRAMUSCULAR | Status: DC | PRN
  Administered 2017-08-13: 10 mg via INTRAVENOUS

## 2017-08-13 MED ORDER — PROMETHAZINE HCL 25 MG/ML IJ SOLN
12.5000 mg | Freq: Four times a day (QID) | INTRAMUSCULAR | Status: DC | PRN
  Administered 2017-08-13: 12.5 mg via INTRAVENOUS

## 2017-08-13 MED ORDER — LIDOCAINE HCL (PF) 1 % IJ SOLN
30.0000 mL | INTRAMUSCULAR | Status: DC | PRN
  Filled 2017-08-13: qty 30

## 2017-08-13 MED ORDER — FENTANYL CITRATE (PF) 100 MCG/2ML IJ SOLN
25.0000 ug | INTRAMUSCULAR | Status: DC | PRN
  Administered 2017-08-13 – 2017-08-14 (×5): 25 ug via INTRAVENOUS

## 2017-08-13 MED ORDER — ONDANSETRON HCL 4 MG/2ML IJ SOLN: 4.0000 mg | Freq: Four times a day (QID) | INTRAMUSCULAR | Status: DC | PRN

## 2017-08-13 MED ORDER — DEXAMETHASONE SODIUM PHOSPHATE 10 MG/ML IJ SOLN
INTRAMUSCULAR | Status: AC
  Filled 2017-08-13: qty 1

## 2017-08-13 MED ORDER — EPHEDRINE SULFATE 50 MG/ML IJ SOLN
INTRAMUSCULAR | Status: AC
  Filled 2017-08-13: qty 1

## 2017-08-13 MED ORDER — ONDANSETRON HCL 4 MG/2ML IJ SOLN
INTRAMUSCULAR | Status: DC | PRN
  Administered 2017-08-13: 4 mg via INTRAVENOUS

## 2017-08-13 MED ORDER — KETAMINE HCL 50 MG/ML IJ SOLN
INTRAMUSCULAR | Status: DC | PRN
  Administered 2017-08-13: 25 mg via INTRAMUSCULAR

## 2017-08-13 MED ORDER — ACETAMINOPHEN 10 MG/ML IV SOLN
INTRAVENOUS | Status: AC
  Filled 2017-08-13: qty 100

## 2017-08-13 MED ORDER — MORPHINE SULFATE (PF) 4 MG/ML IV SOLN: 5.0000 mg | Freq: Once | INTRAVENOUS | Status: DC

## 2017-08-13 MED ORDER — LACTATED RINGERS IV SOLN
INTRAVENOUS | Status: DC
  Administered 2017-08-13 (×2): via INTRAVENOUS

## 2017-08-13 MED ORDER — FENTANYL CITRATE (PF) 100 MCG/2ML IJ SOLN
INTRAMUSCULAR | Status: AC
  Filled 2017-08-13: qty 2

## 2017-08-13 MED ORDER — CEFAZOLIN SODIUM-DEXTROSE 2-4 GM/100ML-% IV SOLN
2.0000 g | INTRAVENOUS | Status: AC
  Administered 2017-08-13: 2 g via INTRAVENOUS
  Filled 2017-08-13: qty 100

## 2017-08-13 MED ORDER — SUGAMMADEX SODIUM 200 MG/2ML IV SOLN
INTRAVENOUS | Status: DC | PRN
  Administered 2017-08-13: 180 mg via INTRAVENOUS

## 2017-08-13 MED ORDER — LACTATED RINGERS IV SOLN
500.0000 mL | INTRAVENOUS | Status: DC | PRN
  Administered 2017-08-13: 500 mL via INTRAVENOUS

## 2017-08-13 MED ORDER — AMMONIA AROMATIC IN INHA
RESPIRATORY_TRACT | Status: AC
  Filled 2017-08-13: qty 10

## 2017-08-13 MED ORDER — SUGAMMADEX SODIUM 200 MG/2ML IV SOLN
INTRAVENOUS | Status: AC
  Filled 2017-08-13: qty 2

## 2017-08-13 MED ORDER — LIDOCAINE 5 % EX PTCH
MEDICATED_PATCH | CUTANEOUS | Status: AC
  Filled 2017-08-13: qty 1

## 2017-08-13 MED ORDER — MISOPROSTOL 200 MCG PO TABS
ORAL_TABLET | ORAL | Status: AC
  Filled 2017-08-13: qty 4

## 2017-08-13 MED ORDER — KETOROLAC TROMETHAMINE 30 MG/ML IJ SOLN
INTRAMUSCULAR | Status: AC
  Filled 2017-08-13: qty 1

## 2017-08-13 MED ORDER — CEFAZOLIN SODIUM-DEXTROSE 2-4 GM/100ML-% IV SOLN
2.0000 g | Freq: Once | INTRAVENOUS | Status: DC
  Filled 2017-08-13: qty 100

## 2017-08-13 MED ORDER — KETOROLAC TROMETHAMINE 30 MG/ML IJ SOLN
INTRAMUSCULAR | Status: DC | PRN
  Administered 2017-08-13: 30 mg via INTRAVENOUS

## 2017-08-13 MED ORDER — PROMETHAZINE HCL 25 MG/ML IJ SOLN
INTRAMUSCULAR | Status: AC
  Administered 2017-08-13: 12.5 mg via INTRAVENOUS
  Filled 2017-08-13: qty 1

## 2017-08-13 MED ORDER — MORPHINE SULFATE (PF) 2 MG/ML IV SOLN
INTRAVENOUS | Status: AC
  Administered 2017-08-13: 5 mg via INTRAVENOUS
  Filled 2017-08-13: qty 3

## 2017-08-13 MED ORDER — OXYTOCIN 10 UNIT/ML IJ SOLN
10.0000 [IU] | Freq: Once | INTRAMUSCULAR | Status: DC
  Filled 2017-08-13: qty 1

## 2017-08-13 MED ORDER — TERBUTALINE SULFATE 1 MG/ML IJ SOLN: 0.2500 mg | Freq: Once | INTRAMUSCULAR | Status: DC | PRN

## 2017-08-13 MED ORDER — DEXAMETHASONE SODIUM PHOSPHATE 10 MG/ML IJ SOLN
INTRAMUSCULAR | Status: DC | PRN
  Administered 2017-08-13: 10 mg via INTRAVENOUS

## 2017-08-13 MED ORDER — ROCURONIUM BROMIDE 50 MG/5ML IV SOLN
INTRAVENOUS | Status: AC
  Filled 2017-08-13: qty 1

## 2017-08-13 MED ORDER — ACETAMINOPHEN 10 MG/ML IV SOLN
INTRAVENOUS | Status: DC | PRN
  Administered 2017-08-13: 1000 mg via INTRAVENOUS

## 2017-08-13 MED ORDER — CEFAZOLIN SODIUM-DEXTROSE 2-4 GM/100ML-% IV SOLN
2.0000 g | Freq: Four times a day (QID) | INTRAVENOUS | Status: DC
Start: 2017-08-13 — End: 2017-08-14
  Administered 2017-08-13: 2 g via INTRAVENOUS
  Filled 2017-08-13 (×4): qty 100

## 2017-08-13 MED ORDER — OXYTOCIN 40 UNITS IN LACTATED RINGERS INFUSION - SIMPLE MED
1.0000 m[IU]/min | INTRAVENOUS | Status: DC
  Administered 2017-08-13: 2 m[IU]/min via INTRAVENOUS
  Filled 2017-08-13 (×2): qty 1000

## 2017-08-13 MED ORDER — FENTANYL CITRATE (PF) 100 MCG/2ML IJ SOLN: 25.0000 ug | INTRAMUSCULAR | Status: DC | PRN

## 2017-08-13 MED ORDER — OXYTOCIN 40 UNITS IN LACTATED RINGERS INFUSION - SIMPLE MED: 2.5000 [IU]/h | INTRAVENOUS | Status: DC

## 2017-08-13 MED ORDER — FENTANYL CITRATE (PF) 100 MCG/2ML IJ SOLN
100.0000 ug | Freq: Once | INTRAMUSCULAR | Status: AC
  Administered 2017-08-13: 100 ug via INTRAVENOUS
  Filled 2017-08-13: qty 2

## 2017-08-13 MED ORDER — OXYTOCIN 40 UNITS IN LACTATED RINGERS INFUSION - SIMPLE MED
INTRAVENOUS | Status: DC | PRN
  Administered 2017-08-13: 1000 mL via INTRAVENOUS

## 2017-08-13 MED ORDER — ROCURONIUM BROMIDE 100 MG/10ML IV SOLN
INTRAVENOUS | Status: DC | PRN
  Administered 2017-08-13: 25 mg via INTRAVENOUS

## 2017-08-13 MED ORDER — LIDOCAINE 5 % EX PTCH
MEDICATED_PATCH | CUTANEOUS | Status: DC | PRN
  Administered 2017-08-13: 1 via TRANSDERMAL

## 2017-08-13 MED ORDER — ACETAMINOPHEN 325 MG PO TABS
650.0000 mg | ORAL_TABLET | ORAL | Status: DC | PRN
  Administered 2017-08-13: 650 mg via ORAL
  Filled 2017-08-13: qty 2

## 2017-08-13 SURGICAL SUPPLY — 29 items
ADH SKN CLS APL DERMABOND .7 (GAUZE/BANDAGES/DRESSINGS) ×2
BAG COUNTER SPONGE EZ (MISCELLANEOUS) ×2 IMPLANT
BAG SPNG 4X4 CLR HAZ (MISCELLANEOUS) ×1
CANISTER SUCT 3000ML PPV (MISCELLANEOUS) ×3 IMPLANT
CHLORAPREP W/TINT 26ML (MISCELLANEOUS) ×6 IMPLANT
COUNTER SPONGE BAG EZ (MISCELLANEOUS) ×1
DERMABOND ADVANCED (GAUZE/BANDAGES/DRESSINGS) ×4
DERMABOND ADVANCED .7 DNX12 (GAUZE/BANDAGES/DRESSINGS) IMPLANT
DRSG TELFA 3X8 NADH (GAUZE/BANDAGES/DRESSINGS) ×3 IMPLANT
ELECT CAUTERY BLADE 6.4 (BLADE) ×2 IMPLANT
ELECT REM PT RETURN 9FT ADLT (ELECTROSURGICAL) ×3
ELECTRODE REM PT RTRN 9FT ADLT (ELECTROSURGICAL) ×1 IMPLANT
GAUZE SPONGE 4X4 12PLY STRL (GAUZE/BANDAGES/DRESSINGS) ×3 IMPLANT
GLOVE BIO SURGEON STRL SZ 6.5 (GLOVE) ×7 IMPLANT
GLOVE BIO SURGEONS STRL SZ 6.5 (GLOVE) ×6
GLOVE INDICATOR 7.0 STRL GRN (GLOVE) ×3 IMPLANT
GOWN STRL REUS W/ TWL LRG LVL3 (GOWN DISPOSABLE) ×2 IMPLANT
GOWN STRL REUS W/TWL LRG LVL3 (GOWN DISPOSABLE) ×6
KIT TURNOVER KIT A (KITS) ×3 IMPLANT
NS IRRIG 1000ML POUR BTL (IV SOLUTION) ×3 IMPLANT
PACK C SECTION AR (MISCELLANEOUS) ×3 IMPLANT
PAD DRESSING TELFA 3X8 NADH (GAUZE/BANDAGES/DRESSINGS) ×1 IMPLANT
PAD OB MATERNITY 4.3X12.25 (PERSONAL CARE ITEMS) ×3 IMPLANT
PAD PREP 24X41 OB/GYN DISP (PERSONAL CARE ITEMS) ×3 IMPLANT
SUT MNCRL AB 4-0 PS2 18 (SUTURE) ×3 IMPLANT
SUT VIC AB 2-0 CT1 27 (SUTURE) ×3
SUT VIC AB 2-0 CT1 TAPERPNT 27 (SUTURE) IMPLANT
SUT VICRYL 2-0 SH 8X27 (SUTURE) ×6 IMPLANT
SUT VICRYL 3-0 36IN CTB-1 (SUTURE) ×2 IMPLANT

## 2017-08-13 NOTE — Progress Notes (Signed)
Called to discuss plan of care with patient.  Patient is currently being managed by Annie ThompsDoreene Burkeon, CNM.  Patient has not been able to receive an epidural due to an elevated white count of 25 (although no overt signs of infection: clear fluid, afebrile, GBS neg). She reports that IV analgesia is not helping and no longer desires a trial of labor. Discussion has been had with patient regarding alternative methods of pain management, including walking, birthing balls, doula support, hydrotherapy, all of which patient declines. She also declines use of Pitocin for augmentation as her contractions have now spaced to every 4-10 minutes. Most recent cervical exam is 6.5/-1/90/AROM (x 12 hrs).  Discussed risks/benefits of C-section, informed patient that due to an elevated WBC count she would require general anesthesia for her C-section as she would not be able to receive a spinal. This would mean missing the birth of her child. We also discussed pain management post-operatively. The risks of cesarean section discussed with the patient included but were not limited to: bleeding which may require transfusion or reoperation; infection which may require antibiotics; injury to bowel, bladder, ureters or other surrounding organs; injury to the fetus; need for additional procedures including hysterectomy in the event of a life-threatening hemorrhage; placental abnormalities wth subsequent pregnancies, incisional problems, thromboembolic phenomenon and other postoperative/anesthesia complications. The patient concurred with the proposed plan, giving informed written consent for the procedure.   Patient has been NPO since admission and she will remain NPO for procedure. Anesthesia and OR aware. Preoperative prophylactic antibiotics and SCDs ordered on call to the OR.  To OR when ready.    Hildred Laserherry, Angelic Schnelle, MD [facility]

## 2017-08-13 NOTE — Progress Notes (Signed)
Moderate vaginal bleeding to peri pad

## 2017-08-13 NOTE — Transfer of Care (Signed)
Immediate Anesthesia Transfer of Care Note  Patient: Amy Cabrera  Procedure(s) Performed: CESAREAN SECTION (N/A Abdomen)  Patient Location: PACU  Anesthesia Type:General  Level of Consciousness: awake, alert , oriented and patient cooperative  Airway & Oxygen Therapy: Patient Spontanous Breathing and Patient connected to nasal cannula oxygen  Post-op Assessment: Report given to RN, Post -op Vital signs reviewed and stable and Patient moving all extremities  Post vital signs: Reviewed and stable  Last Vitals:  Vitals Value Taken Time  BP 123/63 08/13/2017 11:34 PM  Temp    Pulse 86 08/13/2017 11:35 PM  Resp 15 08/13/2017 11:35 PM  SpO2 100 % 08/13/2017 11:35 PM  Vitals shown include unvalidated device data.  Last Pain:  Vitals:   08/13/17 1934  TempSrc: Oral  PainSc: 10-Worst pain ever         Complications: No apparent anesthesia complications

## 2017-08-13 NOTE — Progress Notes (Signed)
LABOR NOTE   Amy Cabrera 22 y.o.@ at 484w2d Early latent labor.  SUBJECTIVE:  Very uncomfortable and requesting epidural  OBJECTIVE:  BP 116/71   Pulse 74   Temp 98.8 F (37.1 C) (Axillary)   Resp 16   Ht 5\' 5"  (1.651 m)   Wt 201 lb (91.2 kg)   LMP 11/04/2016 (Exact Date)   SpO2 100%   BMI 33.45 kg/m  No intake/output data recorded.  She has shown cervical change. CERVIX: 5cm:  90%:   -1:   posterior:   firm SVE:   Dilation: 5 Effacement (%): 90 Station: -1 Exam by:: A.Queena Monrreal CNM CONTRACTIONS: regular, every 2 minutes FHR: Fetal heart tracing reviewed. Baseline: 140 bpm, Variability: Good {> 6 bpm), Accelerations: Reactive and Decelerations: Absent Category I   Analgesia: IV pain meds and requesting epidural. Anesthesia declines placing Epidural at this time due to elevated WBC.   Labs: Lab Results  Component Value Date   WBC 25.3 (H) 08/13/2017   HGB 12.7 08/13/2017   HCT 37.3 08/13/2017   MCV 83.0 08/13/2017   PLT 168 08/13/2017    ASSESSMENT: 1) Labor curve reviewed.       Progress: Early latent labor.     Membranes: ruptured, clear fluid           Active Problems:   Labor and delivery, indication for care   PLAN: Stat repeat CBC , IV antibiotics (Ancef 2g)  ordered to facilitate decreasing WBC allowing for epidural placement. IV pain medications for pain management.    Amy Cabrera, CNM  08/13/2017 4:15 PM

## 2017-08-13 NOTE — Anesthesia Post-op Follow-up Note (Signed)
Anesthesia QCDR form completed.        

## 2017-08-13 NOTE — Op Note (Signed)
Cesarean Section Procedure Note  Indications: patient declines vag del attempt  Pre-operative Diagnosis: 40 week 2 day pregnancy, in active labor, declines further vaginal attempt due to inadequate analgesia.  Post-operative Diagnosis: same  Surgeon: Hildred LaserAnika Niccolo Burggraf, MD  Assistants: Doreene BurkeAnnie Thompson, CNM No other capable assistant available in surgery requiring high level assistant.  Procedure: Primary low transverse Cesarean Section  Anesthesia: General anesthesia  Procedure Details: The patient was seen in the Holding Room. The risks, benefits, complications, treatment options, and expected outcomes were discussed with the patient.  The patient concurred with the proposed plan, giving informed consent.  The site of surgery properly noted/marked. The patient was taken to the Operating Room, identified as Amy Cabrera and the procedure verified as C-Section Delivery. A Time Out was held and the above information confirmed.  After induction of anesthesia, the patient was draped and prepped in the usual sterile manner. Anesthesia was tested and noted to be adequate. A Pfannenstiel incision was made and carried down through the subcutaneous tissue to the fascia. Fascial incision was made and extended transversely. The fascia was separated from the underlying rectus tissue superiorly and inferiorly. The peritoneum was identified and entered. Peritoneal incision was extended longitudinally. The surgical assist was able to provide retraction to allow for clear visualization of surgical site. The utero-vesical peritoneal reflection was incised transversely and the bladder flap was bluntly freed from the lower uterine segment. A low transverse uterine incision was made. Delivered from cephalic presentation was a 2890 gram Female with Apgar scores of 9 at one minute and 9 at five minutes.  The assistant was able to apply adequate fundal pressure to allow for successful delivery of the fetus. After the  umbilical cord was clamped and cut cord blood was obtained for evaluation. The placenta was removed intact and appeared normal. The uterus was exteriorized and cleared of all clots and debris. The uterine outline, tubes and ovaries appeared normal.  The uterine incision was closed with running locked sutures of 0-Vicryl.  A second suture of 0-Vicryl was used in an imbricating layer.  Hemostasis was observed. Lavage was carried out until clear. The fascia was then reapproximated with a running suture of 0-Vicryl. The skin was reapproximated with 4-0 Monocryl.  Instrument, sponge, and needle counts were correct prior the abdominal closure and at the conclusion of the case.   Findings: Female infant, cephalic presentation, 2890 grams, with Apgar scores of 9 at one minute and 9 at five minutes. Intact placenta with 3 vessel cord.  The uterine outline, tubes and ovaries appeared normal.   Estimated Blood Loss:  600 ml      Drains: foley catheter to gravity drainage, 200 clear urine at end of the procedure         Total IV Fluids:  300 ml  Specimens: None         Implants: None         Complications:  None; patient tolerated the procedure well.         Disposition: PACU - hemodynamically stable.         Condition: stable    Hildred Laserherry, Amy Proby, MD [facility]

## 2017-08-13 NOTE — Anesthesia Procedure Notes (Signed)
Procedure Name: Intubation Performed by: Milus Height, CRNA Pre-anesthesia Checklist: Patient identified, Patient being monitored, Timeout performed, Emergency Drugs available and Suction available Patient Re-evaluated:Patient Re-evaluated prior to induction Oxygen Delivery Method: Circle system utilized Preoxygenation: Pre-oxygenation with 100% oxygen Induction Type: IV induction, Rapid sequence and Cricoid Pressure applied Laryngoscope Size: Mac and 3 Grade View: Grade I Tube type: Oral Tube size: 7.0 mm Number of attempts: 1 Airway Equipment and Method: Stylet Placement Confirmation: ETT inserted through vocal cords under direct vision,  positive ETCO2 and breath sounds checked- equal and bilateral Secured at: 20 cm Tube secured with: Tape Dental Injury: Teeth and Oropharynx as per pre-operative assessment

## 2017-08-13 NOTE — Anesthesia Preprocedure Evaluation (Signed)
Anesthesia Evaluation  Patient identified by MRN, date of birth, ID band Patient awake    Reviewed: Allergy & Precautions, NPO status , Patient's Chart, lab work & pertinent test results  History of Anesthesia Complications Negative for: history of anesthetic complications  Airway Mallampati: II       Dental   Pulmonary neg sleep apnea, neg COPD, Current Smoker,           Cardiovascular (-) hypertension(-) Past MI and (-) CHF (-) dysrhythmias (-) Valvular Problems/Murmurs     Neuro/Psych neg Seizures    GI/Hepatic Neg liver ROS, neg GERD  ,  Endo/Other  neg diabetes  Renal/GU negative Renal ROS     Musculoskeletal   Abdominal   Peds  Hematology   Anesthesia Other Findings   Reproductive/Obstetrics                             Anesthesia Physical Anesthesia Plan  ASA: II and emergent  Anesthesia Plan: General   Post-op Pain Management:    Induction: Intravenous, Rapid sequence and Cricoid pressure planned  PONV Risk Score and Plan: 2 and Ondansetron and Dexamethasone  Airway Management Planned: Oral ETT  Additional Equipment:   Intra-op Plan:   Post-operative Plan:   Informed Consent: I have reviewed the patients History and Physical, chart, labs and discussed the procedure including the risks, benefits and alternatives for the proposed anesthesia with the patient or authorized representative who has indicated his/her understanding and acceptance.     Plan Discussed with:   Anesthesia Plan Comments: (Pt with increasing white count in the face of antibiotic tx. Risks of spinal including meningitis paralysis and death as well as risks of general including aspiration, inability to intubate/ventilate, ICU admission and death also described to pt. Pt desires general anesthesia with understanding of these risks and desires to proceed with C-section.)        Anesthesia Quick  Evaluation

## 2017-08-13 NOTE — Progress Notes (Signed)
LABOR NOTE   Amy Cabrera 22 y.o.@ at 919w2d Active phase labor.  SUBJECTIVE:  Uncomfortable crying & screaming in pain. " I cant do it any more" OBJECTIVE:  BP 112/77 (BP Location: Left Arm)   Pulse 68   Temp 98.4 F (36.9 C) (Oral)   Resp 18   Ht 5\' 5"  (1.651 m)   Wt 201 lb (91.2 kg)   LMP 11/04/2016 (Exact Date)   SpO2 100%   BMI 33.45 kg/m  Total I/O In: -  Out: 450 [Urine:450]  She has shown cervical change. CERVIX: 6.5:  90:   -1:   mid position:   firm SVE:   Dilation: 6.5 Effacement (%): 90 Station: -1 Exam by:: AT, CNM CONTRACTIONS: regular, every 3-4 minutes FHR: Fetal heart tracing reviewed. Baseline: 145 bpm, Variability: Good {> 6 bpm), Accelerations: Reactive and Decelerations: Absent Category I   Analgesia: IV pain meds  Labs: Lab Results  Component Value Date   WBC 27.7 (H) 08/13/2017   HGB 12.5 08/13/2017   HCT 36.6 08/13/2017   MCV 83.4 08/13/2017   PLT 145 (L) 08/13/2017    ASSESSMENT: 1) Labor curve reviewed.       Progress: Active phase labor.     Membranes: SROM- clear fluid         Active Problems:   Labor and delivery, indication for care   PLAN: WBC increased from 27.1 to 27.7. Pt is unable to get epidural. She is given option of continuing labor without Augmentin or augmenting labor with pitocin and or use of IV pain medications, as well as alternative pain relief option I.e. Position changes, shower, birth ball. Also discussed primary cesarean . Reviewed risks of surgery , possible complications and likely hood of needing general anesthesia. Pt verbalizes understanding and request c/section. Dr. Valentino Saxonherry notified.   Amy Cabrera, CNM 08/13/2017 9:26 PM

## 2017-08-13 NOTE — Progress Notes (Signed)
LABOR NOTE   Amy Cabrera 22 y.o.@ at 7640w2d early labor.  SUBJECTIVE: Patient is increasingly uncomfortable despite having stadol and fentanyl x2 for pain,is writhing in the bed and crying. OBJECTIVE:  BP 116/71   Pulse 74   Temp 98 F (36.7 C) (Oral)   Resp 16   Ht 5\' 5"  (1.651 m)   Wt 201 lb (91.2 kg)   LMP 11/04/2016 (Exact Date)   SpO2 100%   BMI 33.45 kg/m  No intake/output data recorded.  She ha not shown cervical change. CERVIX: 5cm:  90%:   -1:   posterior:   firm SVE:   Dilation: 5 Effacement (%): 90 Station: -1 Exam by:: A Jaedynn Bohlken CONTRACTIONS: regular, every 2 minutes FHR: Fetal heart tracing reviewed. Baseline: 140 bpm, Variability: Good {> 6 bpm), Accelerations: Reactive and Decelerations: Absent Category I  Analgesia: IV pain meds request epidural but annesthesia declines to place Epidural at this time due to elevated WBC.     Labs: Lab Results  Component Value Date   WBC 27.1 (H) 08/13/2017   HGB 12.5 08/13/2017   HCT 36.6 08/13/2017   MCV 83.3 08/13/2017   PLT 166 08/13/2017    ASSESSMENT: 1) Labor curve reviewed.       Progress: Early latent labor.     Membranes: ruptured, clear fluid           Active Problems:   Labor and delivery, indication for care   PLAN: Repeat CBC @1930  . IV pain medications and nitrous for pain management. Pt states that it is not working.  Pt declines any other alternative pain management options I.e. Shower, birth ball, rocking chair, position changes. Family members angry. Explained to patient and family anesthesia protocol for placing epidural. PT declines pitocin augmentation at this time. Reviewed risk of prolonged rupture of membranes. Pt verbalizes understanding and continues to decline pitocin at this time. Dr. Valentino Saxonherry consulted on plan of care.   Susa Lofflernnie Thomspon,CNM, Shanika Creacy,SNM 08/13/2017 5:29 PM

## 2017-08-13 NOTE — Progress Notes (Signed)
LABOR NOTE   Amy Cabrera 22 y.o.@ at 3557w2d Early latent labor.  SUBJECTIVE:  Patient is uncomfortable with contractions,using nitrous oxide for pain relief.  OBJECTIVE:  BP (!) 100/49   Pulse 71   Temp (P) 98.4 F (36.9 C) (Oral)   Resp 16   Ht 5\' 5"  (1.651 m)   Wt 201 lb (91.2 kg)   LMP 11/04/2016 (Exact Date)   SpO2 100%   BMI 33.45 kg/m  No intake/output data recorded.  She has not shown cervical change. CERVIX: not evaluated due to SROM. SVE:   Dilation: 3 Effacement (%): 80 Station: -1 Exam by:: L.Elks RN CONTRACTIONS: regular, every 2-3 minutes lasting 60-90 seconds FHR: Fetal heart tracing reviewed. Baseline: 145 bpm, Variability: Good {> 6 bpm), Accelerations: Reactive and Decelerations: Absent Category I  Analgesia: Nitrous Oxide  Labs: Lab Results  Component Value Date   WBC 25.3 (H) 08/13/2017   HGB 12.7 08/13/2017   HCT 37.3 08/13/2017   MCV 83.0 08/13/2017   PLT 168 08/13/2017    ASSESSMENT: 1) Labor curve reviewed.       Progress: Early latent labor.     Membranes: ruptured  Active Problems:   Labor and delivery, indication for care   PLAN: continue present management and IV Pitocin augmentation   Thamas Jaegersnnie Landon Bassford,CNM Shanika Creacy,SNM 08/13/2017 2:11 PM

## 2017-08-13 NOTE — Anesthesia Postprocedure Evaluation (Signed)
Anesthesia Post Note  Patient: Amy Cabrera  Procedure(s) Performed: CESAREAN SECTION (N/A Abdomen)  Patient location during evaluation: PACU Anesthesia Type: General Level of consciousness: awake and alert Pain management: pain level controlled Vital Signs Assessment: post-procedure vital signs reviewed and stable Respiratory status: spontaneous breathing and respiratory function stable Cardiovascular status: stable Anesthetic complications: no     Last Vitals:  Vitals:   08/13/17 1934 08/13/17 2340  BP: 112/77 123/63  Pulse: 68 73  Resp: 18 17  Temp: 36.9 C 36.9 C  SpO2:  100%    Last Pain:  Vitals:   08/13/17 1934  TempSrc: Oral  PainSc: 10-Worst pain ever                 Srihaan Mastrangelo K

## 2017-08-14 ENCOUNTER — Encounter: Payer: Self-pay | Admitting: Obstetrics and Gynecology

## 2017-08-14 LAB — CBC
HEMATOCRIT: 32.2 % — AB (ref 35.0–47.0)
HEMOGLOBIN: 11 g/dL — AB (ref 12.0–16.0)
MCH: 28.5 pg (ref 26.0–34.0)
MCHC: 34.1 g/dL (ref 32.0–36.0)
MCV: 83.7 fL (ref 80.0–100.0)
Platelets: 174 10*3/uL (ref 150–440)
RBC: 3.85 MIL/uL (ref 3.80–5.20)
RDW: 12.7 % (ref 11.5–14.5)
WBC: 33.1 10*3/uL — ABNORMAL HIGH (ref 3.6–11.0)

## 2017-08-14 MED ORDER — SODIUM CHLORIDE 0.9% FLUSH: 9.0000 mL | INTRAVENOUS | Status: DC | PRN

## 2017-08-14 MED ORDER — DIPHENHYDRAMINE HCL 25 MG PO CAPS: 25.0000 mg | ORAL_CAPSULE | Freq: Four times a day (QID) | ORAL | Status: DC | PRN

## 2017-08-14 MED ORDER — OXYCODONE-ACETAMINOPHEN 5-325 MG PO TABS
2.0000 | ORAL_TABLET | ORAL | Status: DC | PRN
  Administered 2017-08-14 – 2017-08-16 (×10): 2 via ORAL
  Filled 2017-08-14 (×11): qty 2

## 2017-08-14 MED ORDER — PRENATAL MULTIVITAMIN CH
1.0000 | ORAL_TABLET | Freq: Every day | ORAL | Status: DC
  Administered 2017-08-15 – 2017-08-16 (×2): 1 via ORAL
  Filled 2017-08-14 (×3): qty 1

## 2017-08-14 MED ORDER — MORPHINE SULFATE 2 MG/ML IV SOLN
INTRAVENOUS | Status: DC
  Administered 2017-08-14: 02:00:00 via INTRAVENOUS
  Filled 2017-08-14 (×2): qty 25

## 2017-08-14 MED ORDER — DIPHENHYDRAMINE HCL 50 MG/ML IJ SOLN: 12.5000 mg | Freq: Four times a day (QID) | INTRAMUSCULAR | Status: DC | PRN

## 2017-08-14 MED ORDER — SENNOSIDES-DOCUSATE SODIUM 8.6-50 MG PO TABS
2.0000 | ORAL_TABLET | ORAL | Status: DC
  Administered 2017-08-14 – 2017-08-16 (×3): 2 via ORAL
  Filled 2017-08-14 (×3): qty 2

## 2017-08-14 MED ORDER — MAGNESIUM HYDROXIDE 400 MG/5ML PO SUSP
30.0000 mL | ORAL | Status: DC | PRN
Start: 2017-08-14 — End: 2017-08-16

## 2017-08-14 MED ORDER — MORPHINE SULFATE 2 MG/ML IV SOLN
INTRAVENOUS | Status: DC
  Filled 2017-08-14: qty 30

## 2017-08-14 MED ORDER — LACTATED RINGERS IV SOLN: INTRAVENOUS | Status: DC

## 2017-08-14 MED ORDER — FERROUS SULFATE 325 (65 FE) MG PO TABS
325.0000 mg | ORAL_TABLET | Freq: Two times a day (BID) | ORAL | Status: DC
  Administered 2017-08-14 – 2017-08-16 (×5): 325 mg via ORAL
  Filled 2017-08-14 (×5): qty 1

## 2017-08-14 MED ORDER — SIMETHICONE 80 MG PO CHEW
80.0000 mg | CHEWABLE_TABLET | ORAL | Status: DC
  Administered 2017-08-14 – 2017-08-16 (×3): 80 mg via ORAL
  Filled 2017-08-14 (×3): qty 1

## 2017-08-14 MED ORDER — WITCH HAZEL-GLYCERIN EX PADS: 1.0000 "application " | MEDICATED_PAD | CUTANEOUS | Status: DC | PRN

## 2017-08-14 MED ORDER — DIPHENHYDRAMINE HCL 12.5 MG/5ML PO ELIX
12.5000 mg | ORAL_SOLUTION | Freq: Four times a day (QID) | ORAL | Status: DC | PRN
  Filled 2017-08-14: qty 5

## 2017-08-14 MED ORDER — MORPHINE SULFATE 2 MG/ML IV SOLN
INTRAVENOUS | Status: DC
  Administered 2017-08-14: 10.5 mL via INTRAVENOUS
  Administered 2017-08-14: 8.25 mL via INTRAVENOUS
  Filled 2017-08-14: qty 25

## 2017-08-14 MED ORDER — DIBUCAINE 1 % RE OINT: 1.0000 "application " | TOPICAL_OINTMENT | RECTAL | Status: DC | PRN

## 2017-08-14 MED ORDER — LIDOCAINE 5 % EX PTCH: 1.0000 | MEDICATED_PATCH | CUTANEOUS | Status: DC

## 2017-08-14 MED ORDER — ZOLPIDEM TARTRATE 5 MG PO TABS: 5.0000 mg | ORAL_TABLET | Freq: Every evening | ORAL | Status: DC | PRN

## 2017-08-14 MED ORDER — ONDANSETRON HCL 4 MG/2ML IJ SOLN: 4.0000 mg | Freq: Four times a day (QID) | INTRAMUSCULAR | Status: DC | PRN

## 2017-08-14 MED ORDER — SIMETHICONE 80 MG PO CHEW
80.0000 mg | CHEWABLE_TABLET | ORAL | Status: DC | PRN
  Administered 2017-08-15: 80 mg via ORAL
  Filled 2017-08-14: qty 1

## 2017-08-14 MED ORDER — OXYCODONE-ACETAMINOPHEN 5-325 MG PO TABS: 2.0000 | ORAL_TABLET | ORAL | Status: DC | PRN

## 2017-08-14 MED ORDER — COCONUT OIL OIL
1.0000 "application " | TOPICAL_OIL | Status: DC | PRN
  Administered 2017-08-16: 1 via TOPICAL
  Filled 2017-08-14: qty 120

## 2017-08-14 MED ORDER — ACETAMINOPHEN 325 MG PO TABS: 650.0000 mg | ORAL_TABLET | ORAL | Status: DC | PRN

## 2017-08-14 MED ORDER — OXYTOCIN 40 UNITS IN LACTATED RINGERS INFUSION - SIMPLE MED: 2.5000 [IU]/h | INTRAVENOUS | Status: AC

## 2017-08-14 MED ORDER — MENTHOL 3 MG MT LOZG
1.0000 | LOZENGE | OROMUCOSAL | Status: DC | PRN
  Filled 2017-08-14 (×2): qty 9

## 2017-08-14 MED ORDER — OXYCODONE-ACETAMINOPHEN 5-325 MG PO TABS
1.0000 | ORAL_TABLET | ORAL | Status: DC | PRN
  Administered 2017-08-14: 1 via ORAL

## 2017-08-14 MED ORDER — IBUPROFEN 800 MG PO TABS
800.0000 mg | ORAL_TABLET | Freq: Four times a day (QID) | ORAL | Status: DC
  Administered 2017-08-14 – 2017-08-16 (×9): 800 mg via ORAL
  Filled 2017-08-14 (×10): qty 1

## 2017-08-14 MED ORDER — OXYCODONE-ACETAMINOPHEN 5-325 MG PO TABS: 1.0000 | ORAL_TABLET | ORAL | Status: DC | PRN

## 2017-08-14 MED ORDER — NALOXONE HCL 0.4 MG/ML IJ SOLN: 0.4000 mg | INTRAMUSCULAR | Status: DC | PRN

## 2017-08-14 NOTE — Lactation Note (Signed)
This note was copied from a baby's chart. Lactation Consultation Note  Patient Name: Amy Cabrera [id number]'XToday's Date: 08/14/2017 Reason for consult: Follow-up assessment;Primapara;Term;Difficult latch Assisted mom with positioning in comfortable position in modified cradle hold on Alanna's favorite side (right breast).  Once we achieved a deep enough latch, she began good rhythmic sucking with occasional swallows.  Demonstrated how to massage breast to keep her actively sucking at the breast.  She came off the left breast after 8 minutes and fell asleep.  After we attempted a burp, we put her to left breast which she struggled to latch deeply.  Once we finally got her to latch, she only took a few sucks before falling back to sleep.  Mom felt better about this feeding.  Encouraged mom to call for assistance with feedings.  Maternal Data Formula Feeding for Exclusion: No Has patient been taught Hand Expression?: Yes Does the patient have breastfeeding experience prior to this delivery?: No  Feeding Feeding Type: Breast Fed Length of feed: 8 min  LATCH Score Latch: Repeated attempts needed to sustain latch, nipple held in mouth throughout feeding, stimulation needed to elicit sucking reflex.  Audible Swallowing: A few with stimulation  Type of Nipple: Everted at rest and after stimulation  Comfort (Breast/Nipple): Soft / non-tender  Hold (Positioning): Assistance needed to correctly position infant at breast and maintain latch.  LATCH Score: 7  Interventions Interventions: Reverse pressure;Assisted with latch;Breast compression;Adjust position;Breast massage;Support pillows  Lactation Tools Discussed/Used WIC Program: Yes   Consult Status Consult Status: PRN Follow-up type: Call as needed    Louis MeckelWilliams, Prapti Grussing Kay 08/14/2017, 9:32 PM

## 2017-08-14 NOTE — Plan of Care (Signed)
Transferred to room 340 from PACU. Alert and oriented with aprop. Affect. Color good, skin w&d. BBS clear. Instructed in Incentive Spirometer, TCDB, Splint Pillow Falls Policy and Moderate Falls Level. Pt. V/O. Instructed in Infant Breast Feeding, Safety and Security as well as Safe Sleep and Pt, V/O. Instructed in PCA management and v/o.

## 2017-08-14 NOTE — Progress Notes (Signed)
In pt room with Doreene BurkeAnnie Thompson, CNM and midwife student with pt and family members. Pt is hurting and was educated on her not being able to have an epidural due to elevated white blood cell count. Pt family mad about this and pt mom said "well if this bitch would have checked her this morning...." to her other daughter. I asked "are you talking about me?" and she said "yea". I said "you cannot speak to me that way". Pattricia Bossnnie explained to the pt that she had spoken with me this morning about rechecking her 2 hours after the first check (which would mean she would be checked on the next shift). The pt's family (pt mom and pt sister) has been rude to day shift and to the staff taking care of her on my shift.

## 2017-08-14 NOTE — Progress Notes (Signed)
Postpartum Day # 1: Cesarean Delivery  Subjective: Patient reports intermittent incisional pain and tolerating PO.  Currently with PCA pump for pain management. Has not yet ambulated or voided (still has foley catheter in place). No flatus or BM yet.   Objective: Vital signs in last 24 hours: Temp:  [98 F (36.7 C)-99 F (37.2 C)] 98 F (36.7 C) (08/04 1139) Pulse Rate:  [58-89] 67 (08/04 1139) Resp:  [12-20] 18 (08/04 1139) BP: (95-123)/(49-77) 99/66 (08/04 1139) SpO2:  [96 %-100 %] 98 % (08/04 1139)  Physical Exam:  General: alert and no distress Lungs: clear to auscultation bilaterally Breasts: normal appearance, no masses or tenderness Heart: regular rate and rhythm, S1, S2 normal, no murmur, click, rub or gallop Abdomen: soft, non-tender; bowel sounds normal; no masses,  no organomegaly Pelvis: Lochia appropriate, Uterine Fundus firm, Incision: bandage clean/dry/intact Extremities: DVT Evaluation: No evidence of DVT seen on physical exam. SCDs in place.    CBC Latest Ref Rng & Units 08/14/2017 08/13/2017 08/13/2017  WBC 3.6 - 11.0 K/uL 33.1(H) 27.7(H) 27.1(H)  Hemoglobin 12.0 - 16.0 g/dL 11.0(L) 12.5 12.5  Hematocrit 35.0 - 47.0 % 32.2(L) 36.6 36.6  Platelets 150 - 440 K/uL 174 145(L) 166    Assessment/Plan: Status post Cesarean section. Doing well postoperatively.  Breastfeeding, lactation consult as needed Advance diet Will transition to PO pain management from PCA pump.  Remove foley catheter Discontinue IVF Leukocytosis with no signs of infection (had leukocytosis intrapartum as well); remains afebrile.  Received 2 doses of Ancef intrapartum (1 dose immediately prior to C-section).  Will continue to follow trend. CBC ordered for tomorrow morning.  Continue current care.  Hildred LaserAnika Tevin Shillingford, MD [facility]

## 2017-08-14 NOTE — Plan of Care (Signed)
Afeb. VSS. Continues with elevated WBC;No s/s infection,Afebrile. CBC ordered for AM. Color good, skin w&d. Alert and oriented with quiet affect. Lower abdominal dressing d&I. Voiding without c/o after Foley was D/C'd on Day shift. States Pain control with scheduled Ibuprofen and PRN Pan Medication. Tolerating Regular diet.

## 2017-08-15 ENCOUNTER — Other Ambulatory Visit: Payer: Medicaid Other

## 2017-08-15 ENCOUNTER — Encounter: Payer: Medicaid Other | Admitting: Certified Nurse Midwife

## 2017-08-15 LAB — CBC
HCT: 27 % — ABNORMAL LOW (ref 35.0–47.0)
HEMOGLOBIN: 9 g/dL — AB (ref 12.0–16.0)
MCH: 28.1 pg (ref 26.0–34.0)
MCHC: 33.3 g/dL (ref 32.0–36.0)
MCV: 84.5 fL (ref 80.0–100.0)
Platelets: 141 10*3/uL — ABNORMAL LOW (ref 150–440)
RBC: 3.2 MIL/uL — ABNORMAL LOW (ref 3.80–5.20)
RDW: 12.9 % (ref 11.5–14.5)
WBC: 17.1 10*3/uL — ABNORMAL HIGH (ref 3.6–11.0)

## 2017-08-15 LAB — RPR: RPR Ser Ql: NONREACTIVE

## 2017-08-15 NOTE — Progress Notes (Signed)
Progress Note - Cesarean Delivery  Amy Cabrera is a 22 y.o. G1P1001 now PP day 2 s/p C-Section, Low Transverse .   Subjective:  Patient reports no problems with eating, passing gass, voiding, or their wound. Complains of throat soreness from general anesthesia.     Objective:  Vital signs in last 24 hours: Temp:  [97.9 F (36.6 C)-98.7 F (37.1 C)] 98.3 F (36.8 C) (08/05 0459) Pulse Rate:  [59-87] 70 (08/05 0459) Resp:  [16-18] 18 (08/05 0459) BP: (90-99)/(54-66) 92/56 (08/05 0459) SpO2:  [98 %-100 %] 98 % (08/05 0459)  Physical Exam:  General: alert, cooperative, appears stated age and fatigued  Lungs clear bilaterally Heart: RRR Bowel sounds present Lochia: appropriate Uterine Fundus: firm Incision: dressing dry and intact. Pt instructed that it will be removed today DVT Evaluation: No evidence of DVT seen on physical exam. No cords or calf tenderness. No significant calf/ankle edema.    Data Review Recent Labs    08/14/17 0609 08/15/17 0600  HGB 11.0* 9.0*  HCT 32.2* 27.0*   CBC    Component Value Date/Time   WBC 17.1 (H) 08/15/2017 0600   RBC 3.20 (L) 08/15/2017 0600   HGB 9.0 (L) 08/15/2017 0600   HGB 10.7 (L) 05/19/2017 1408   HCT 27.0 (L) 08/15/2017 0600   HCT 33.3 (L) 05/19/2017 1408   PLT 141 (L) 08/15/2017 0600   PLT 186 05/19/2017 1408   MCV 84.5 08/15/2017 0600   MCV 86 05/19/2017 1408   MCH 28.1 08/15/2017 0600   MCHC 33.3 08/15/2017 0600   RDW 12.9 08/15/2017 0600   RDW 13.9 05/19/2017 1408   LYMPHSABS 1.4 08/13/2017 1948   LYMPHSABS 1.4 02/02/2017 1604   MONOABS 1.1 (H) 08/13/2017 1948   EOSABS 0.0 08/13/2017 1948   EOSABS 0.0 02/02/2017 1604   BASOSABS 0.1 08/13/2017 1948   BASOSABS 0.0 02/02/2017 1604    Assessment:  Active Problems:   Labor and delivery, indication for care   Status post Cesarean section. Doing well postoperatively.    Plan:       Continue current care.  Plan for D/c tomorrow  Doreene Burkennie Boone Gear, CNM   08/15/2017 7:46 AM

## 2017-08-15 NOTE — Lactation Note (Signed)
This note was copied from a baby's chart. Lactation Consultation Note  Patient Name: Girl Jerral Ralphabitha Schedler ZOXWR'UToday's Date: 08/15/2017 Reason for consult: Follow-up assessment;Primapara;Term;Difficult latch Assisted Mom with latching Alanna to right breast.  Once deep enough latch was achieved, she began good rhythmic sucking for about 3 to 4 minutes and then kept falling asleep.  Demonstrated how to massage breast and stimulate Alanna to get her to suck for another couple of minutes. After she came off the right breast, tried to convince mom to try the left breast.  Mom said to put her back in the crib and she would pump left breast in a little while.  She kept fussing, so mom finally agreed to put her to the left breast.  She struggled a little more to sustain the latch on the left breast.  She kept latching, taking a few sucks, coming off and re latching for 2 to 3 more minutes.  Mom kept saying the whole time, "see, she does not like the left breast."  Explained the more stimulation at the breast we could get would assure a better milk production.  Mom pumped the left breast for a few minutes and then voiced too much cramping to keep going for now, but would do it again in a few minutes.    Maternal Data Formula Feeding for Exclusion: No Has patient been taught Hand Expression?: Yes(Still only getting a drop of 2 when hand express and mom does not want to keep expressing) Does the patient have breastfeeding experience prior to this delivery?: No  Feeding Feeding Type: Breast Fed Length of feed: 5 min(On breast for 9 minutes with 5 minutes of actual strong sucking at the breast)  LATCH Score Latch: Repeated attempts needed to sustain latch, nipple held in mouth throughout feeding, stimulation needed to elicit sucking reflex.  Audible Swallowing: A few with stimulation  Type of Nipple: Everted at rest and after stimulation(Semi flat, but everts with compression of firm breasts)  Comfort  (Breast/Nipple): Soft / non-tender  Hold (Positioning): Assistance needed to correctly position infant at breast and maintain latch.  LATCH Score: 7  Interventions Interventions: Reverse pressure;Assisted with latch;Breast compression;Support pillows  Lactation Tools Discussed/Used WIC Program: Yes Pump Review: Setup, frequency, and cleaning;Milk Storage;Other (comment) Initiated by:: S.Tinika Bucknam,RN,BSN Date initiated:: 08/15/17   Consult Status Consult Status: Follow-up Date: 08/15/17 Follow-up type: Call as needed    Louis MeckelWilliams, Taleyah Hillman Kay 08/15/2017, 3:56 PM

## 2017-08-15 NOTE — H&P (Signed)
History and Physical   HPI  Amy Cabrera is a 22 y.o. G1P1001 at 4764w2d Estimated Date of Delivery: 08/11/17 who is being admitted for labor management and SROM.   OB History  OB History  Gravida Para Term Preterm AB Living  1 1 1  0 0 1  SAB TAB Ectopic Multiple Live Births  0 0 0 0 1    # Outcome Date GA Lbr Len/2nd Weight Sex Delivery Anes PTL Lv  1 Term 08/13/17 6864w2d  6 lb 5.9 oz (2.89 kg) F CS-LTranv Gen  LIV     Name: Tiley,PENDINGBABY     Apgar1: 9  Apgar5: 9    PROBLEM LIST  Pregnancy complications or risks: Patient Active Problem List   Diagnosis Date Noted  . Labor and delivery, indication for care 08/13/2017  . Indication for care in labor and delivery, antepartum 08/11/2017  . Indication for care in labor or delivery 06/11/2017  . Pregnancy 06/11/2017  . Anemia in pregnancy 05/23/2017  . Family history of breast cancer 03/03/2017  . Family history of type 2 diabetes mellitus 03/03/2017  . Allergic rhinitis 10/01/2014  . Low back pain 07/02/2014  . PES PLANUS 12/23/2009    Prenatal labs and studies: ABO, Rh: --/--/O POS (08/03 1014) Antibody: NEG (08/03 1014) Rubella: 1.51 (01/23 1604) RPR: Non Reactive (08/03 1017)  HBsAg: Negative (01/23 1604)  HIV: Non Reactive (01/23 1604)  WUJ:WJXBJYNWGBS:Negative (07/08 1651)   Past Medical History:  Diagnosis Date  . Back pain   . Medical history non-contributory      Past Surgical History:  Procedure Laterality Date  . CESAREAN SECTION N/A 08/13/2017   Procedure: CESAREAN SECTION;  Surgeon: Hildred Laserherry, Anika, MD;  Location: [facility] ORS;  Service: Obstetrics;  Laterality: N/A;  . NO PAST SURGERIES    . WISDOM TOOTH EXTRACTION       Medications    Current Discharge Medication List    CONTINUE these medications which have NOT CHANGED   Details  acetaminophen (TYLENOL) 500 MG tablet Take 500 mg by mouth every 6 (six) hours as needed.    Prenatal Vit-Fe Fumarate-FA (PRENATAL MULTIVITAMIN) TABS tablet Take 1  tablet by mouth daily at 12 noon.    ferrous sulfate 325 (65 FE) MG tablet Take 1 tablet (325 mg total) by mouth daily with breakfast. Qty: 30 tablet, Refills: 3         Allergies  Patient has no known allergies.  Review of Systems  Constitutional: negative Eyes: negative Ears, nose, mouth, throat, and face: negative Respiratory: negative Cardiovascular: negative Gastrointestinal: negative Genitourinary:negative Integument/breast: negative Hematologic/lymphatic: negative Musculoskeletal:negative Neurological: negative Behavioral/Psych: negative Endocrine: negative Allergic/Immunologic: negative  Physical Exam  BP 95/60 (BP Location: Left Arm)   Pulse 77   Temp 98.5 F (36.9 C) (Oral)   Resp 18   Ht 5\' 5"  (1.651 m)   Wt 201 lb (91.2 kg)   LMP 11/04/2016 (Exact Date)   SpO2 98%   Breastfeeding? Unknown   BMI 33.45 kg/m   Lungs:  CTA B Cardio: RRR  Abd: Soft, gravid, NT Presentation: cephalic EXT: No C/C/ 1+ Edema CERVIX: 3cm  See Prenatal records for more detailed PE.     FHR:  Baseline: 145 bpm, Variability: Good {> 6 bpm), Accelerations: Reactive and Decelerations: Absent  Toco: Uterine Contractions: Frequency: Every 2-4 minutes and Duration: 60-90 seconds, Moderate   Test Results  Results for orders placed or performed during the hospital encounter of 08/13/17 (from the past 24 hour(s))  CBC     Status: Abnormal   Collection Time: 08/15/17  6:00 AM  Result Value Ref Range   WBC 17.1 (H) 3.6 - 11.0 K/uL   RBC 3.20 (L) 3.80 - 5.20 MIL/uL   Hemoglobin 9.0 (L) 12.0 - 16.0 g/dL   HCT 16.1 (L) 09.6 - 04.5 %   MCV 84.5 80.0 - 100.0 fL   MCH 28.1 26.0 - 34.0 pg   MCHC 33.3 32.0 - 36.0 g/dL   RDW 40.9 81.1 - 91.4 %   Platelets 141 (L) 150 - 440 K/uL   Group B Strep negative  Assessment   G1P1001 at [redacted]w[redacted]d Estimated Date of Delivery: 08/11/17  The fetus is reassuring.    Patient Active Problem List   Diagnosis Date Noted  . Labor and  delivery, indication for care 08/13/2017  . Indication for care in labor and delivery, antepartum 08/11/2017  . Indication for care in labor or delivery 06/11/2017  . Pregnancy 06/11/2017  . Anemia in pregnancy 05/23/2017  . Family history of breast cancer 03/03/2017  . Family history of type 2 diabetes mellitus 03/03/2017  . Allergic rhinitis 10/01/2014  . Low back pain 07/02/2014  . 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 Admit to L&D :   continue present management and IV Pitocin augmentation as needed 2. EFM:-- Category 1 3. Epidural if desired.  Stadol for IV pain until epidural requested. 4. Admission labs  5. Anticipate NSVD  Doreene Burke, CNM

## 2017-08-16 MED ORDER — IBUPROFEN 800 MG PO TABS: 800.0000 mg | ORAL_TABLET | Freq: Four times a day (QID) | ORAL | 0 refills | Status: DC

## 2017-08-16 MED ORDER — OXYCODONE-ACETAMINOPHEN 5-325 MG PO TABS: 1.0000 | ORAL_TABLET | ORAL | 0 refills | Status: DC | PRN

## 2017-08-16 MED ORDER — DOCUSATE SODIUM 100 MG PO CAPS: 100.0000 mg | ORAL_CAPSULE | Freq: Every day | ORAL | 2 refills | Status: DC | PRN

## 2017-08-16 MED ORDER — SIMETHICONE 80 MG PO CHEW: 80.0000 mg | CHEWABLE_TABLET | ORAL | 0 refills | Status: DC | PRN

## 2017-08-16 NOTE — Lactation Note (Signed)
This note was copied from a baby's chart. Lactation Consultation Note  Patient Name: Amy Cabrera WJXBJ'YToday's Date: 08/16/2017 Reason for consult: Follow-up assessment   Maternal Data    Feeding Feeding Type: Breast Fed Length of feed: 30 min(per mom)  LATCH Score Latch: Repeated attempts needed to sustain latch, nipple held in mouth throughout feeding, stimulation needed to elicit sucking reflex.  Audible Swallowing: Spontaneous and intermittent  Type of Nipple: Everted at rest and after stimulation  Comfort (Breast/Nipple): Soft / non-tender  Hold (Positioning): Assistance needed to correctly position infant at breast and maintain latch.  LATCH Score: 8  Interventions Interventions: Adjust position  Lactation Tools Discussed/Used     Consult Status Consult Status: Follow-up Date: 08/16/17 Follow-up type: In-patient  Mother called for assistance with breastfeeding and states that infant does not latch well on her right breast. Infant is attempting to latch on right breast while LC is sandwiching the breast but is getting frustrated and crying. LC calmed infant and attempted to latch using nipple shield and curved tipped syringe of mother's breastmilk. A pillow was also used to position infant higher at the breast. Infant seemed to still struggle and fuss when latching so LC removed the nipple shield and used the curved tipped syringe of breastmilk to administer drops of milk as infant was latching on to right breast. Infant was able to latch and fed for 30 minutes with audible swallows heard. Mother pumped left breast after the feeding. Nipple is red around the base of the nipple and after observation of nipple while pumping, mother was given larger flange size (30 mm). Mother was instructed to keep nipple moisturized with coconut oil. Parents were informed of the outpatient lactation clinic as well as the breastfeeding support group at the hospital and denies further  questions at this time.    Arlyss Gandylicia Griselda Bramblett 08/16/2017, 1:37 PM

## 2017-08-16 NOTE — Final Progress Note (Signed)
Discharge Day SOAP Note:  Subjective:  The patient has no complaints.  She is ambulating well. She is taking PO well. Pain is well controlled with current medications. Patient is urinating without difficulty.   She is passing flatus.    Objective  Vital signs in last 24 hours: BP (!) 99/56 (BP Location: Left Arm)   Pulse 76   Temp 98.1 F (36.7 C) (Oral)   Resp 16   Ht 5\' 5"  (1.651 m)   Wt 201 lb (91.2 kg)   LMP 11/04/2016 (Exact Date)   SpO2 98%   Breastfeeding? Unknown   BMI 33.45 kg/m   Physical Exam: Gen: NAD Lungs clear bilaterally Heart:RRR Abdomen: clean, dry, no drainage Fundus Fundal Tone: Firm  Lochia Amount: Small     Data Review Labs: CBC Latest Ref Rng & Units 08/15/2017 08/14/2017 08/13/2017  WBC 3.6 - 11.0 K/uL 17.1(H) 33.1(H) 27.7(H)  Hemoglobin 12.0 - 16.0 g/dL 9.0(L) 11.0(L) 12.5  Hematocrit 35.0 - 47.0 % 27.0(L) 32.2(L) 36.6  Platelets 150 - 440 K/uL 141(L) 174 145(L)   O POS  Assessment:  Active Problems:   Labor and delivery, indication for care   Doing well.  Normal progress as expected.     Plan:  Discharge to home  Modified rest as directed - may slowly resume normal activities with restrictions  as discussed.  Medications as written.  See below for additional.       Discharge Instructions: Per After Visit Summary. Activity: Advance as tolerated. Pelvic rest for 6 weeks.  Also refer to After Visit Summary.  Wound care discussed. Diet: Regular Medications: Allergies as of 08/16/2017   No Known Allergies     Medication List    TAKE these medications   acetaminophen 500 MG tablet Commonly known as:  TYLENOL Take 500 mg by mouth every 6 (six) hours as needed.   docusate sodium 100 MG capsule Commonly known as:  COLACE Take 1 capsule (100 mg total) by mouth daily as needed.   ferrous sulfate 325 (65 FE) MG tablet Take 1 tablet (325 mg total) by mouth daily with breakfast.   ibuprofen 800 MG tablet Commonly known as:   ADVIL,MOTRIN Take 1 tablet (800 mg total) by mouth every 6 (six) hours.   oxyCODONE-acetaminophen 5-325 MG tablet Commonly known as:  PERCOCET/ROXICET Take 1 tablet by mouth every 4 (four) hours as needed (pain scale 4-7).   prenatal multivitamin Tabs tablet Take 1 tablet by mouth daily at 12 noon.   simethicone 80 MG chewable tablet Commonly known as:  MYLICON Chew 1 tablet (80 mg total) by mouth as needed for flatulence.      Outpatient follow up:  Postpartum contraception: Undecided, will discuss postpartum visit   Discharged Condition: good  Discharged to: home  Newborn Data: Disposition:home with mother  Apgars: APGAR (1 MIN): 9   APGAR (5 MINS): 9   APGAR (10 MINS):    Baby Feeding: Bottle and Breast  Doreene Burkennie Lorretta Kerce, CNM  08/16/2017 8:00 AM

## 2017-08-16 NOTE — Discharge Summary (Signed)
Obstetric Discharge Summary  Patient ID: Amy Cabrera MRN: [id number] DOB/AGE: 24/05/1995 22 y.o.   Date of Admission: 08/13/2017  Date of Discharge: 08/16/2017  Admitting Diagnosis: Induction of labor at 6233w2d   Mode of Delivery: primary cesarean section, low uterine, transverse. For labor intolerance ( unable to get epidural) under general anethesia     Discharge Diagnosis: No other diagnosis   Intrapartum Procedures: antibiotics for elvated WBC   Post partum procedures: none  Complications: none                        Discharge Day SOAP Note:  Subjective:  The patient has no complaints.  She is ambulating well. She is taking PO well. Pain is well controlled with current medications. Patient is urinating without difficulty.   She is passing flatus.    Objective  Vital signs in last 24 hours: BP (!) 99/56 (BP Location: Left Arm)   Pulse 76   Temp 98.1 F (36.7 C) (Oral)   Resp 16   Ht 5\' 5"  (1.651 m)   Wt 201 lb (91.2 kg)   LMP 11/04/2016 (Exact Date)   SpO2 98%   Breastfeeding? Unknown   BMI 33.45 kg/m   Physical Exam: Gen: NAD Lungs clear bilaterally Heart:RRR Abdomen: clean, dry, no drainage Fundus Fundal Tone: Firm  Lochia Amount: Small     Data Review Labs: CBC Latest Ref Rng & Units 08/15/2017 08/14/2017 08/13/2017  WBC 3.6 - 11.0 K/uL 17.1(H) 33.1(H) 27.7(H)  Hemoglobin 12.0 - 16.0 g/dL 9.0(L) 11.0(L) 12.5  Hematocrit 35.0 - 47.0 % 27.0(L) 32.2(L) 36.6  Platelets 150 - 440 K/uL 141(L) 174 145(L)   O POS  Assessment:  Active Problems:   Labor and delivery, indication for care   Doing well.  Normal progress as expected.     Plan:  Discharge to home  Modified rest as directed - may slowly resume normal activities with restrictions  as discussed.  Medications as written.  See below for additional.       Discharge Instructions: Per After Visit Summary. Activity: Advance as tolerated. Pelvic rest for 6 weeks.  Also refer to After Visit  Summary.  Wound care discussed. Diet: Regular Medications: Allergies as of 08/16/2017   No Known Allergies     Medication List    TAKE these medications   acetaminophen 500 MG tablet Commonly known as:  TYLENOL Take 500 mg by mouth every 6 (six) hours as needed.   docusate sodium 100 MG capsule Commonly known as:  COLACE Take 1 capsule (100 mg total) by mouth daily as needed.   ferrous sulfate 325 (65 FE) MG tablet Take 1 tablet (325 mg total) by mouth daily with breakfast.   ibuprofen 800 MG tablet Commonly known as:  ADVIL,MOTRIN Take 1 tablet (800 mg total) by mouth every 6 (six) hours.   oxyCODONE-acetaminophen 5-325 MG tablet Commonly known as:  PERCOCET/ROXICET Take 1 tablet by mouth every 4 (four) hours as needed (pain scale 4-7).   prenatal multivitamin Tabs tablet Take 1 tablet by mouth daily at 12 noon.   simethicone 80 MG chewable tablet Commonly known as:  MYLICON Chew 1 tablet (80 mg total) by mouth as needed for flatulence.      Outpatient follow up:  Postpartum contraception: Undecided, will discuss postpartum visit   Discharged Condition: good  Discharged to: home  Newborn Data: Disposition:home with mother  Apgars: APGAR (1 MIN): 9   APGAR (5 MINS): 9  APGAR (10 MINS):    Baby Feeding: Bottle and Breast  Doreene Burke, CNM  08/16/2017 8:00 AM

## 2017-08-16 NOTE — Progress Notes (Signed)
Patient discharged home with infant. Discharge instructions and prescriptions given and reviewed with patient. Patient verbalized understanding. Escorted out by staff.  

## 2017-08-24 ENCOUNTER — Encounter: Payer: Self-pay | Admitting: Obstetrics and Gynecology

## 2017-08-24 ENCOUNTER — Ambulatory Visit (INDEPENDENT_AMBULATORY_CARE_PROVIDER_SITE_OTHER): Payer: Medicaid Other | Admitting: Obstetrics and Gynecology

## 2017-08-24 VITALS — BP 124/78 | HR 90 | Ht 65.0 in | Wt 187.2 lb

## 2017-08-24 DIAGNOSIS — Z98891 History of uterine scar from previous surgery: Secondary | ICD-10-CM

## 2017-08-24 DIAGNOSIS — R519 Headache, unspecified: Secondary | ICD-10-CM

## 2017-08-24 DIAGNOSIS — R51 Headache: Secondary | ICD-10-CM

## 2017-08-24 DIAGNOSIS — R399 Unspecified symptoms and signs involving the genitourinary system: Secondary | ICD-10-CM | POA: Diagnosis not present

## 2017-08-24 DIAGNOSIS — Z5189 Encounter for other specified aftercare: Secondary | ICD-10-CM

## 2017-08-24 LAB — POCT URINALYSIS DIPSTICK
Bilirubin, UA: NEGATIVE
Glucose, UA: NEGATIVE
KETONES UA: NEGATIVE
Nitrite, UA: NEGATIVE
Protein, UA: POSITIVE — AB
SPEC GRAV UA: 1.025 (ref 1.010–1.025)
Urobilinogen, UA: 0.2 E.U./dL
pH, UA: 6 (ref 5.0–8.0)

## 2017-08-24 NOTE — Progress Notes (Signed)
    OBSTETRICS/GYNECOLOGY POST-OPERATIVE CLINIC VISIT  Subjective:     Duard Larsenabitha N Baughman is a 22 y.o. female who presents to the clinic 1.5 weeks status post primary low transverse C-section for failure to progress, declined further trial of labor. Eating a regular diet without difficulty. Bowel movements are abnormal with complaints of rectal pressure after bowel movements. Does note mild constipation, but is now taking stool softeners which are helping.. Denies rectal bleeding. She also c/o urinary discomfort for the past week. Pain is controlled with current analgesics. Medications being used: acetaminophen and prescription NSAID's including ibuprofen (Motrin).  The following portions of the patient's history were reviewed and updated as appropriate: allergies, current medications, past family history, past medical history, past social history, past surgical history and problem list.  Review of Systems A comprehensive review of systems was negative except for: Neurological: positive for headaches (notes off and on since delivery; has taken Tylenol with occasional relief). Also, what's noted in HPI.     Objective:    BP 124/78   Pulse 90   Ht 5\' 5"  (1.651 m)   Wt 187 lb 3.2 oz (84.9 kg)   BMI 31.15 kg/m  General:  alert and no distress  Abdomen: soft, bowel sounds active, non-tender  Incision:   healing well, no drainage, no erythema, no hernia, no seroma, no swelling, no dehiscence, incision well approximated     Labs:   Results for orders placed or performed in visit on 08/24/17  POCT urinalysis dipstick  Result Value Ref Range   Color, UA yellow    Clarity, UA clear    Glucose, UA Negative Negative   Bilirubin, UA neg    Ketones, UA neg    Spec Grav, UA 1.025 1.010 - 1.025   Blood, UA 3+    pH, UA 6.0 5.0 - 8.0   Protein, UA Positive (A) Negative   Urobilinogen, UA 0.2 0.2 or 1.0 E.U./dL   Nitrite, UA neg    Leukocytes, UA Moderate (2+) (A) Negative   Appearance  yellow    Odor foul     Assessment:    Post-operative check s/p C-section  UTI symptoms Headaches     Plan:   1. Continue any current medications. Patient notes she tends to use Tylenol more that Motrin. Advise that Motrin could potentially ease headaches better, or could try Excedrin.  2. Wound care discussed. 3. UT symptoms, no evidence of UTI on UA, blood in UA likely secondary to postpartum lochia. Discussed increased hydration and cranberry juice/extract. 4. Activity restrictions: no bending, stooping, or squatting, no lifting more than 10-15 pounds and pelvic rest x 5 weeks.  5. Anticipated return to work: 5 weeks. 6. Follow up: 4-5 weeks for postpartum visit with midwifery service.     Hildred Laserherry, Kyleena Scheirer, MD [facility]

## 2017-08-24 NOTE — Progress Notes (Signed)
Pt stated that her incision is healing well. Pt stated having issues with urination and bowel movement. Pt stated that it hurts to go to the bathroom. Pt stated that she has headaches that hurts all the way down her shoulders.

## 2017-09-14 ENCOUNTER — Encounter: Payer: Medicaid Other | Admitting: Obstetrics and Gynecology

## 2017-09-14 ENCOUNTER — Ambulatory Visit (INDEPENDENT_AMBULATORY_CARE_PROVIDER_SITE_OTHER): Payer: Medicaid Other | Admitting: Certified Nurse Midwife

## 2017-09-14 VITALS — BP 116/71 | HR 82 | Ht 65.0 in | Wt 187.5 lb

## 2017-09-14 DIAGNOSIS — F53 Postpartum depression: Secondary | ICD-10-CM

## 2017-09-14 DIAGNOSIS — O99345 Other mental disorders complicating the puerperium: Secondary | ICD-10-CM

## 2017-09-14 MED ORDER — SERTRALINE HCL 25 MG PO TABS: 25.0000 mg | ORAL_TABLET | Freq: Every day | ORAL | 0 refills | Status: DC

## 2017-09-14 MED ORDER — NORETHIN ACE-ETH ESTRAD-FE 1-20 MG-MCG PO TABS
1.0000 | ORAL_TABLET | Freq: Every day | ORAL | 11 refills | Status: DC
Start: 2017-09-14 — End: 2017-11-09

## 2017-09-14 NOTE — Progress Notes (Signed)
Subjective:    Amy Cabrera is a 22 y.o. G74P1001 Caucasian female who presents for a postpartum visit. She is 6 weeks postpartum following a primary cesarean section, low transverse incision at 40.2 gestational weeks. Anesthesia: general due to elevated WBC.I have fully reviewed the prenatal and intrapartum course. Postpartum course has been WNL. Baby's course has been WNL. Baby is feeding by bottle. Bleeding irregular spotting . Bowel function is normal. Bladder function is normal. Patient is not sexually active. Last sexual activity: prior to delivery. Contraception method is OCP (estrogen/progesterone) starting, order placed.. Postpartum depression screening: positive. Score 8.  Last pap1/23/19 and was abnormal needs repeat x 1 yr.   The following portions of the patient's history were reviewed and updated as appropriate: allergies, current medications, past medical history, past surgical history and problem list.  Review of Systems Pertinent items are noted in HPI.   Vitals:   09/14/17 1051  BP: 116/71  Pulse: 82  Weight: 85 kg  Height: 5\' 5"  (1.651 m)   No LMP recorded.  Objective:   General:  alert, cooperative and no distress   Breasts:  deferred, no complaints  Lungs: clear to auscultation bilaterally  Heart:  regular rate and rhythm  Abdomen: soft, nontender, healed    Vulva: normal  Vagina: normal vagina  Cervix:  closed  Corpus: Well-involuted  Adnexa:  Non-palpable  Rectal Exam: No hemorrhoids        Assessment:   Postpartum exam 6 wks s/p Primary cesarean section Bottle feeding Depression screening-positve Contraception counseling   Plan:  : OCP (estrogen/progesterone). Start on zoloft 25 mg daily. Referral for counseling placed. Pt denies desire to hurt herself or the baby. She is tearful, has trauma due to delivery experience and anxiety. She states she has difficulty sleeping because she is watching the baby to make sure she is breathing. Reassurance  given. Reviewed plan of care. Pt verbal contract . Follow up 3 wks for medication check.   Doreene Burke, CNM

## 2017-09-14 NOTE — Patient Instructions (Signed)

## 2017-10-12 ENCOUNTER — Ambulatory Visit (INDEPENDENT_AMBULATORY_CARE_PROVIDER_SITE_OTHER): Payer: Medicaid Other | Admitting: Certified Nurse Midwife

## 2017-10-12 ENCOUNTER — Encounter: Payer: Self-pay | Admitting: Certified Nurse Midwife

## 2017-10-12 VITALS — BP 104/71 | HR 75 | Ht 65.0 in | Wt 187.6 lb

## 2017-10-12 DIAGNOSIS — Z5181 Encounter for therapeutic drug level monitoring: Secondary | ICD-10-CM

## 2017-10-12 DIAGNOSIS — F53 Postpartum depression: Secondary | ICD-10-CM | POA: Diagnosis not present

## 2017-10-12 DIAGNOSIS — O99345 Other mental disorders complicating the puerperium: Secondary | ICD-10-CM | POA: Diagnosis not present

## 2017-10-12 MED ORDER — SERTRALINE HCL 50 MG PO TABS: 50.0000 mg | ORAL_TABLET | Freq: Every day | ORAL | 0 refills | Status: DC

## 2017-10-12 NOTE — Progress Notes (Signed)
  Medication Management Clinic Visit Note  Patient: Amy Cabrera MRN: [id number] Date of Birth: Oct 05, 1995 PCP: Excell Seltzer, MD   [name] 22 y.o. female presents for a follow up  visit after starting zoloft. She has been taking it for 4 wks. And states she is feeling better but still has episodes of feeling sad and crying.   BP 104/71   Pulse 75   Ht 5\' 5"  (1.651 m)   Wt 187 lb 9 oz (85.1 kg)   LMP 10/05/2017 (Exact Date)   Breastfeeding? No   BMI 31.21 kg/m   Patient Information   Past Medical History:  Diagnosis Date  . Back pain   . Medical history non-contributory       Past Surgical History:  Procedure Laterality Date  . CESAREAN SECTION N/A 08/13/2017   Procedure: CESAREAN SECTION;  Surgeon: Hildred Laser, MD;  Location: [facility] ORS;  Service: Obstetrics;  Laterality: N/A;  . NO PAST SURGERIES    . WISDOM TOOTH EXTRACTION       Family History  Problem Relation Age of Onset  . Cancer Maternal Grandmother        breast CA  . Heart disease Maternal Grandfather        CAD  . Cancer Maternal Grandfather        colon CA  . Diabetes Maternal Grandfather      Office Visit from 10/12/2017 in [facility]  PHQ-9 Total Score  5      Family Support: Good, pt mother and mother in law  Social History   Substance and Sexual Activity  Alcohol Use No  . Alcohol/week: 0.0 standard drinks      Social History   Tobacco Use  Smoking Status Current Every Day Smoker  . Packs/day: 0.50  . Years: 7.00  . Pack years: 3.50  . Types: Cigarettes  Smokeless Tobacco Never Used      Health Maintenance  Topic Date Due  . INFLUENZA VACCINE  10/13/2018 (Originally 08/11/2017)  . PAP SMEAR  02/03/2020  . TETANUS/TDAP  05/20/2027  . HIV Screening  Completed     Assessment and Plan: Increased zoloft dose to 50 mg daily, encouraged use of counselor. Will place a new order for referral. Encourage exercise and time for herself 1-2 a week. She verbalizes  and agrees to plan . Follow up in 4 wks for medication check.   I attest more than 50% of visit spent assessing pt S&S of depression and improvement since start of zoloft, discussed increasing dose and counseling. Face to face time 10 min.   Doreene Burke, CNM

## 2017-10-12 NOTE — Patient Instructions (Signed)
Living With Depression Everyone experiences occasional disappointment, sadness, and loss in their lives. When you are feeling down, blue, or sad for at least 2 weeks in a row, it may mean that you have depression. Depression can affect your thoughts and feelings, relationships, daily activities, and physical health. It is caused by changes in the way your brain functions. If you receive a diagnosis of depression, your health care provider will tell you which type of depression you have and what treatment options are available to you. If you are living with depression, there are ways to help you recover from it and also ways to prevent it from coming back. How to cope with lifestyle changes Coping with stress Stress is your body's reaction to life changes and events, both good and bad. Stressful situations may include:  Getting married.  The death of a spouse.  Losing a job.  Retiring.  Having a baby.  Stress can last just a few hours or it can be ongoing. Stress can play a major role in depression, so it is important to learn both how to cope with stress and how to think about it differently. Talk with your health care provider or a counselor if you would like to learn more about stress reduction. He or she may suggest some stress reduction techniques, such as:  Music therapy. This can include creating music or listening to music. Choose music that you enjoy and that inspires you.  Mindfulness-based meditation. This kind of meditation can be done while sitting or walking. It involves being aware of your normal breaths, rather than trying to control your breathing.  Centering prayer. This is a kind of meditation that involves focusing on a spiritual word or phrase. Choose a word, phrase, or sacred image that is meaningful to you and that brings you peace.  Deep breathing. To do this, expand your stomach and inhale slowly through your nose. Hold your breath for 3-5 seconds, then exhale  slowly, allowing your stomach muscles to relax.  Muscle relaxation. This involves intentionally tensing muscles then relaxing them.  Choose a stress reduction technique that fits your lifestyle and personality. Stress reduction techniques take time and practice to develop. Set aside 5-15 minutes a day to do them. Therapists can offer training in these techniques. The training may be covered by some insurance plans. Other things you can do to manage stress include:  Keeping a stress diary. This can help you learn what triggers your stress and ways to control your response.  Understanding what your limits are and saying no to requests or events that lead to a schedule that is too full.  Thinking about how you respond to certain situations. You may not be able to control everything, but you can control how you react.  Adding humor to your life by watching funny films or TV shows.  Making time for activities that help you relax and not feeling guilty about spending your time this way.  Medicines Your health care provider may suggest certain medicines if he or she feels that they will help improve your condition. Avoid using alcohol and other substances that may prevent your medicines from working properly (may interact). It is also important to:  Talk with your pharmacist or health care provider about all the medicines that you take, their possible side effects, and what medicines are safe to take together.  Make it your goal to take part in all treatment decisions (shared decision-making). This includes giving input on the side   effects of medicines. It is best if shared decision-making with your health care provider is part of your total treatment plan.  If your health care provider prescribes a medicine, you may not notice the full benefits of it for 4-8 weeks. Most people who are treated for depression need to be on medicine for at least 6-12 months after they feel better. If you are taking  medicines as part of your treatment, do not stop taking medicines without first talking to your health care provider. You may need to have the medicine slowly decreased (tapered) over time to decrease the risk of harmful side effects. Relationships Your health care provider may suggest family therapy along with individual therapy and drug therapy. While there may not be family problems that are causing you to feel depressed, it is still important to make sure your family learns as much as they can about your mental health. Having your family's support can help make your treatment successful. How to recognize changes in your condition Everyone has a different response to treatment for depression. Recovery from major depression happens when you have not had signs of major depression for two months. This may mean that you will start to:  Have more interest in doing activities.  Feel less hopeless than you did 2 months ago.  Have more energy.  Overeat less often, or have better or improving appetite.  Have better concentration.  Your health care provider will work with you to decide the next steps in your recovery. It is also important to recognize when your condition is getting worse. Watch for these signs:  Having fatigue or low energy.  Eating too much or too little.  Sleeping too much or too little.  Feeling restless, agitated, or hopeless.  Having trouble concentrating or making decisions.  Having unexplained physical complaints.  Feeling irritable, angry, or aggressive.  Get help as soon as you or your family members notice these symptoms coming back. How to get support and help from others How to talk with friends and family members about your condition Talking to friends and family members about your condition can provide you with one way to get support and guidance. Reach out to trusted friends or family members, explain your symptoms to them, and let them know that you are  working with a health care provider to treat your depression. Financial resources Not all insurance plans cover mental health care, so it is important to check with your insurance carrier. If paying for co-pays or counseling services is a problem, search for a local or county mental health care center. They may be able to offer public mental health care services at low or no cost when you are not able to see a private health care provider. If you are taking medicine for depression, you may be able to get the generic form, which may be less expensive. Some makers of prescription medicines also offer help to patients who cannot afford the medicines they need. Follow these instructions at home:  Get the right amount and quality of sleep.  Cut down on using caffeine, tobacco, alcohol, and other potentially harmful substances.  Try to exercise, such as walking or lifting small weights.  Take over-the-counter and prescription medicines only as told by your health care provider.  Eat a healthy diet that includes plenty of vegetables, fruits, whole grains, low-fat dairy products, and lean protein. Do not eat a lot of foods that are high in solid fats, added sugars, or salt.    Keep all follow-up visits as told by your health care provider. This is important. Contact a health care provider if:  You stop taking your antidepressant medicines, and you have any of these symptoms: ? Nausea. ? Headache. ? Feeling lightheaded. ? Chills and body aches. ? Not being able to sleep (insomnia).  You or your friends and family think your depression is getting worse. Get help right away if:  You have thoughts of hurting yourself or others. If you ever feel like you may hurt yourself or others, or have thoughts about taking your own life, get help right away. You can go to your nearest emergency department or call:  Your local emergency services (911 in the [place]  A suicide crisis helpline, such as the  National Suicide Prevention Lifeline at 1-800-273-8255. This is open 24-hours a day.  Summary  If you are living with depression, there are ways to help you recover from it and also ways to prevent it from coming back.  Work with your health care team to create a management plan that includes counseling, stress management techniques, and healthy lifestyle habits. This information is not intended to replace advice given to you by your health care provider. Make sure you discuss any questions you have with your health care provider. Document Released: 12/01/2015 Document Revised: 12/01/2015 Document Reviewed: 12/01/2015 Elsevier Interactive Patient Education  2018 Elsevier Inc.  

## 2017-11-09 ENCOUNTER — Ambulatory Visit (INDEPENDENT_AMBULATORY_CARE_PROVIDER_SITE_OTHER): Payer: Medicaid Other | Admitting: Certified Nurse Midwife

## 2017-11-09 ENCOUNTER — Encounter: Payer: Self-pay | Admitting: Certified Nurse Midwife

## 2017-11-09 VITALS — BP 104/65 | HR 76 | Ht 65.0 in | Wt 193.2 lb

## 2017-11-09 DIAGNOSIS — Z79899 Other long term (current) drug therapy: Secondary | ICD-10-CM | POA: Diagnosis not present

## 2017-11-09 MED ORDER — NORETHIN ACE-ETH ESTRAD-FE 1-20 MG-MCG PO TABS: 1.0000 | ORAL_TABLET | Freq: Every day | ORAL | 11 refills | Status: DC

## 2017-11-09 MED ORDER — SERTRALINE HCL 50 MG PO TABS: 50.0000 mg | ORAL_TABLET | Freq: Every day | ORAL | 6 refills | Status: DC

## 2017-11-09 NOTE — Progress Notes (Signed)
  Medication Management Clinic Visit Note  Patient: Amy Cabrera MRN: [id number] Date of Birth: 03-16-1995 PCP: Excell Seltzer, MD   [name] 22 y.o. female presents for follow evaluation of zoloft.  She was seen on 10/2 , dose was increased to 50 mg daily. She states she is feeling much better and is back to her normal self. She is smiling.  PhQ9 score 3.   BP 104/65   Pulse 76   Ht 5\' 5"  (1.651 m)   Wt 193 lb 3 oz (87.6 kg)   LMP 11/04/2017 (Exact Date)   Breastfeeding? No   BMI 32.15 kg/m   Patient Information   Past Medical History:  Diagnosis Date  . Back pain   . Medical history non-contributory       Past Surgical History:  Procedure Laterality Date  . CESAREAN SECTION N/A 08/13/2017   Procedure: CESAREAN SECTION;  Surgeon: Hildred Laser, MD;  Location: [facility] ORS;  Service: Obstetrics;  Laterality: N/A;  . NO PAST SURGERIES    . WISDOM TOOTH EXTRACTION       Family History  Problem Relation Age of Onset  . Cancer Maternal Grandmother        breast CA  . Heart disease Maternal Grandfather        CAD  . Cancer Maternal Grandfather        colon CA  . Diabetes Maternal Grandfather     New Diagnoses (since last visit): None  Family Support: Good   Social History   Substance and Sexual Activity  Alcohol Use No  . Alcohol/week: 0.0 standard drinks      Social History   Tobacco Use  Smoking Status Current Every Day Smoker  . Packs/day: 0.50  . Years: 7.00  . Pack years: 3.50  . Types: Cigarettes  Smokeless Tobacco Never Used      Health Maintenance  Topic Date Due  . INFLUENZA VACCINE  10/13/2018 (Originally 08/11/2017)  . PAP SMEAR  02/03/2020  . TETANUS/TDAP  05/20/2027  . HIV Screening  Completed     Assessment and Plan: Continue with 50 mg daily of Zoloft. Refill placed for 6 months. PT states she did not go to counslor and stating that she is no longer interested in this. Pt encouraged to reach out if she changes her mind. She  will follow up for annual or sooner if needed.   I attest more than 50% of visit spent reviewing history, discussing current mood and symptoms, and reviewing self help , medication , and counseling options. Follow up prn.   Doreene Burke, CNM

## 2017-11-09 NOTE — Patient Instructions (Signed)
Depression Screening Depression screening is a tool that your health care provider can use to learn if you have symptoms of depression. Depression is a common condition with many symptoms that are also often found in other conditions. Depression is treatable, but it must first be diagnosed. You may not know that certain feelings, thoughts, and behaviors that you are having can be symptoms of depression. Taking a depression screening test can help you and your health care provider decide if you need more assessment, or if you should be referred to a mental health care provider. What are the screening tests?  You may have a physical exam to see if another condition is affecting your mental health. You may have a blood or urine sample taken during the physical exam.  You may be interviewed using a screening tool that was developed from research, such as one of these: ? Patient Health Questionnaire (PHQ). This is a set of either 2 or 9 questions. A health care provider who has been trained to score this screening test uses a guide to assess if your symptoms suggest that you may have depression. ? Hamilton Depression Rating Scale (HAM-D). This is a set of either 17 or 24 questions. You may be asked to take it again during or after your treatment, to see if your depression has gotten better. ? Beck Depression Inventory (BDI). This is a set of 21 multiple choice questions. Your health care provider scores your answers to assess:  Your level of depression, ranging from mild to severe.  Your response to treatment.  Your health care provider may talk with you about your daily activities, such as eating, sleeping, work, and recreation, and ask if you have had any changes in activity.  Your health care provider may ask you to see a mental health specialist, such as a psychiatrist or psychologist, for more evaluation. Who should be screened for depression?  All adults, including adults with a family history  of a mental health disorder.  Adolescents who are 12-18 years old.  People who are recovering from a myocardial infarction (MI).  Pregnant women, or women who have given birth.  People who have a long-term (chronic) illness.  Anyone who has been diagnosed with another type of a mental health disorder.  Anyone who has symptoms that could show depression. What do my results mean? Your health care provider will review the results of your depression screening, physical exam, and lab tests. Positive screens suggest that you may have depression. Screening is the first step in getting the care that you may need. It is up to you to get your screening results. Ask your health care provider, or the department that is doing your screening tests, when your results will be ready. Talk with your health care provider about your results and diagnosis. A diagnosis of depression is made using the Diagnostic and Statistical Manual of Mental Disorders (DSM-V). This is a book that lists the number and type of symptoms that must be present for a health care provider to give a specific diagnosis.  Your health care provider may work with you to treat your symptoms of depression, or your health care provider may help you find a mental health provider who can assess, diagnose, and treat your depression. Get help right away if:  You have thoughts about hurting yourself or others. If you ever feel like you may hurt yourself or others, or have thoughts about taking your own life, get help right away. You can   go to your nearest emergency department or call:  Your local emergency services (911 in the [place]  A suicide crisis helpline, such as the National Suicide Prevention Lifeline at 1-800-273-8255. This is open 24 hours a day.  Summary  Depression screening is the first step in getting the help that you may need.  If your screening test shows symptoms of depression (is positive), your health care provider may ask  you to see a mental health provider.  Anyone who is age 12 or older should be screened for depression. This information is not intended to replace advice given to you by your health care provider. Make sure you discuss any questions you have with your health care provider. Document Released: 05/14/2016 Document Revised: 05/14/2016 Document Reviewed: 05/14/2016 Elsevier Interactive Patient Education  2018 Elsevier Inc.  

## 2017-12-22 ENCOUNTER — Other Ambulatory Visit: Payer: Self-pay

## 2017-12-22 MED ORDER — NORETHIN ACE-ETH ESTRAD-FE 1-20 MG-MCG PO TABS: 1.0000 | ORAL_TABLET | Freq: Every day | ORAL | 0 refills | Status: DC

## 2017-12-22 NOTE — Telephone Encounter (Signed)
1 pack of OCPs faxed to [facility] [address] per pt request.

## 2018-07-09 ENCOUNTER — Other Ambulatory Visit: Payer: Self-pay | Admitting: Certified Nurse Midwife

## 2018-12-04 ENCOUNTER — Other Ambulatory Visit: Payer: Self-pay

## 2018-12-04 ENCOUNTER — Ambulatory Visit
Admission: EM | Admit: 2018-12-04 | Discharge: 2018-12-04 | Disposition: A | Discharge status: Home / Self Care | Payer: Managed Care, Other (non HMO) | Attending: Family Medicine | Admitting: Family Medicine

## 2018-12-04 DIAGNOSIS — R519 Headache, unspecified: Secondary | ICD-10-CM

## 2018-12-04 DIAGNOSIS — R0981 Nasal congestion: Secondary | ICD-10-CM | POA: Diagnosis not present

## 2018-12-04 DIAGNOSIS — B349 Viral infection, unspecified: Secondary | ICD-10-CM

## 2018-12-04 DIAGNOSIS — F17210 Nicotine dependence, cigarettes, uncomplicated: Secondary | ICD-10-CM

## 2018-12-04 DIAGNOSIS — J029 Acute pharyngitis, unspecified: Secondary | ICD-10-CM

## 2018-12-04 HISTORY — DX: Postpartum depression: F53.0

## 2018-12-04 LAB — RAPID STREP SCREEN (MED CTR MEBANE ONLY): Streptococcus, Group A Screen (Direct): NEGATIVE

## 2018-12-04 NOTE — ED Provider Notes (Signed)
[facility]    CSN: [id number] Arrival date & time: 12/04/18  0909      History   Chief Complaint Chief Complaint  Patient presents with  . Nasal Congestion  . Sore Throat  . Headache    HPI Amy Cabrera is a 23 y.o. female.   23 yo female with a c/o nasal congestion for one week as well as sore throat and headache for the past 3 days. Denies any fevers or chills.    Sore Throat Associated symptoms include headaches.  Headache   Past Medical History:  Diagnosis Date  . Back pain   . Medical history non-contributory   . Post partum depression     Patient Active Problem List   Diagnosis Date Noted  . Postpartum depression 10/12/2017  . Family history of breast cancer 03/03/2017  . Family history of type 2 diabetes mellitus 03/03/2017  . Allergic rhinitis 10/01/2014  . Low back pain 07/02/2014  . 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 CESAREAN SECTION N/A 08/13/2017   Procedure: CESAREAN SECTION;  Surgeon: Hildred Laser, MD;  Location: [facility] ORS;  Service: Obstetrics;  Laterality: N/A;  . NO PAST SURGERIES    . WISDOM TOOTH EXTRACTION      OB History    Gravida  1   Para  1   Term  1   Preterm  0   AB  0   Living  1     SAB  0   TAB  0   Ectopic  0   Multiple  0   Live Births  1            Home Medications    Prior to Admission medications   Medication Sig Start Date End Date Taking? Authorizing Provider  acetaminophen (TYLENOL) 500 MG tablet Take 500 mg by mouth every 6 (six) hours as needed.    [provider]  ibuprofen (ADVIL,MOTRIN) 800 MG tablet Take 1 tablet (800 mg total) by mouth every 6 (six) hours. 08/16/17   Doreene Burke, CNM  norethindrone-ethinyl estradiol (JUNEL FE,GILDESS FE,LOESTRIN FE) 1-20 MG-MCG tablet Take 1 tablet by mouth daily. 12/22/17   Shambley, Melody N, CNM  sertraline (ZOLOFT) 50 MG tablet Take 1 tablet by mouth once daily 07/18/18    Doreene Burke, CNM    Family History Family History  Problem Relation Age of Onset  . Cancer Maternal Grandmother        breast CA  . Heart disease Maternal Grandfather        CAD  . Cancer Maternal Grandfather        colon CA  . Diabetes Maternal Grandfather     Social History Social History   Tobacco Use  . Smoking status: Current Every Day Smoker    Packs/day: 0.25    Years: 7.00    Pack years: 1.75    Types: Cigarettes  . Smokeless tobacco: Never Used  Substance Use Topics  . Alcohol use: Yes    Alcohol/week: 0.0 standard drinks    Comment: social  . Drug use: No     Allergies   Patient has no known allergies.   Review of Systems Review of Systems  Neurological: Positive for headaches.     Physical Exam Triage Vital Signs ED Triage Vitals [12/04/18 0920]  Enc Vitals Group     BP 109/75     Pulse Rate 80     Resp  18     Temp 98.5 F (36.9 C)     Temp src      SpO2 100 %     Weight 223 lb (101.2 kg)     Height 5\' 5"  (1.651 m)     Head Circumference      Peak Flow      Pain Score 5     Pain Loc      Pain Edu?      Excl. in [place]?    No data found.  Updated Vital Signs BP 109/75 (BP Location: Left Arm)   Pulse 80   Temp 98.5 F (36.9 C)   Resp 18   Ht 5\' 5"  (1.651 m)   Wt 101.2 kg   LMP 11/20/2018   SpO2 100%   BMI 37.11 kg/m   Visual Acuity Right Eye Distance:   Left Eye Distance:   Bilateral Distance:    Right Eye Near:   Left Eye Near:    Bilateral Near:     Physical Exam Vitals signs and nursing note reviewed.  Constitutional:      General: She is not in acute distress.    Appearance: She is not toxic-appearing or diaphoretic.  Cardiovascular:     Rate and Rhythm: Normal rate.  Pulmonary:     Effort: Pulmonary effort is normal. No respiratory distress.  Neurological:     Mental Status: She is alert.      UC Treatments / Results  Labs (all labs ordered are listed, but only abnormal results are displayed) Labs  Reviewed  RAPID STREP SCREEN ([facility] ONLY)  NOVEL CORONAVIRUS, NAA (HOSP ORDER, SEND-OUT TO REF LAB; TAT 18-24 HRS)  CULTURE, GROUP A STREP [facility])    EKG   Radiology No results found.  Procedures Procedures (including critical care time)  Medications Ordered in UC Medications - No data to display  Initial Impression / Assessment and Plan / UC Course  I have reviewed the triage vital signs and the nursing notes.  Pertinent labs & imaging results that were available during my care of the patient were reviewed by me and considered in my medical decision making (see chart for details).      Final Clinical Impressions(s) / UC Diagnoses   Final diagnoses:  Viral syndrome     Discharge Instructions     Rest, fluids, over the counter medications Await test result    ED Prescriptions    None     1. Lab result (negative rapid strep) and diagnosis reviewed with patient 2. Recommend supportive treatment as above 3. Follow-up prn if symptoms worsen or don't improve   PDMP not reviewed this encounter.   Norval Gable, MD 12/04/18 1126

## 2018-12-04 NOTE — Discharge Instructions (Signed)
Rest, fluids, over the counter medications Await test result

## 2018-12-04 NOTE — ED Triage Notes (Signed)
Nasal congestion and head congestion x one week. Sore throat and headache since Saturday. No fever.

## 2018-12-06 ENCOUNTER — Ambulatory Visit (INDEPENDENT_AMBULATORY_CARE_PROVIDER_SITE_OTHER): Payer: Managed Care, Other (non HMO) | Admitting: Family Medicine

## 2018-12-06 ENCOUNTER — Other Ambulatory Visit: Payer: Self-pay

## 2018-12-06 ENCOUNTER — Encounter: Payer: Self-pay | Admitting: Family Medicine

## 2018-12-06 VITALS — Ht 65.0 in | Wt 225.0 lb

## 2018-12-06 DIAGNOSIS — J029 Acute pharyngitis, unspecified: Secondary | ICD-10-CM | POA: Diagnosis not present

## 2018-12-06 DIAGNOSIS — R0981 Nasal congestion: Secondary | ICD-10-CM | POA: Diagnosis not present

## 2018-12-06 LAB — CULTURE, GROUP A STREP (THRC)

## 2018-12-06 LAB — NOVEL CORONAVIRUS, NAA (HOSP ORDER, SEND-OUT TO REF LAB; TAT 18-24 HRS): SARS-CoV-2, NAA: NOT DETECTED

## 2018-12-06 MED ORDER — PENICILLIN V POTASSIUM 500 MG PO TABS: 500.0000 mg | ORAL_TABLET | Freq: Two times a day (BID) | ORAL | 0 refills | Status: AC

## 2018-12-06 NOTE — Progress Notes (Signed)
Virtual Visit via Video Note  I connected with Amy Cabrera on 12/06/18 at 10:15 AM EST by a video enabled telemedicine application and verified that I am speaking with the correct person using two identifiers.  Location: Patient: In her home Provider: [facility]   I discussed the limitations of evaluation and management by telemedicine and the availability of in person appointments. The patient expressed understanding and agreed to proceed.  History of Present Illness: Chief Complaint  Patient presents with  . Sore Throat    Pt c/o head congestion, sore throat and PND. Pt states that she feels she might have strep. Seen by [facility] ion MOnday - came back negative but she feels like she tested too soon. There are white patches in her throat. No fever. Tylenol cold and flu and cough drops as treatment. Pt feels her throat is swollen on the left side.   This is a 23 year old female who presents for virtual visit for above complaints.  She was seen by urgent care 2 days ago and had negative rapid strep and throat culture.  Her COVID-19 test is still pending.  She reports that her headache has resolved and she is not running a fever.  She denies shortness of breath, difficulty breathing.  She does have cough related to postnasal drainage.  She reports that she has a severe sore throat with white patches on her tonsils.  She has been taking Tylenol Cold and flu with little improvement of symptoms.  She does not have any known Covid contacts.  Past Medical History:  Diagnosis Date  . Back pain   . Medical history non-contributory   . Post partum depression    Past Surgical History:  Procedure Laterality Date  . CESAREAN SECTION N/A 08/13/2017   Procedure: CESAREAN SECTION;  Surgeon: Hildred Laser, MD;  Location: [facility] ORS;  Service: Obstetrics;  Laterality: N/A;  . NO PAST SURGERIES    . WISDOM TOOTH EXTRACTION     Family History  Problem Relation Age of Onset  . Cancer Maternal  Grandmother        breast CA  . Heart disease Maternal Grandfather        CAD  . Cancer Maternal Grandfather        colon CA  . Diabetes Maternal Grandfather    Social History   Tobacco Use  . Smoking status: Current Every Day Smoker    Packs/day: 0.25    Years: 7.00    Pack years: 1.75    Types: Cigarettes  . Smokeless tobacco: Never Used  Substance Use Topics  . Alcohol use: Yes    Alcohol/week: 0.0 standard drinks    Comment: social  . Drug use: No       Observations/Objective: Patient is alert and answers questions appropriately.  She appears mildly ill.  She has audible nasal congestion.  I am able to get a good look at her oropharynx and she has erythematous, nonswollen tonsils with white patches.  Respirations are even and unlabored there is no audible wheeze or witnessed cough.  Visible skin is unremarkable.  Mood and affect are appropriate. Ht 5\' 5"  (1.651 m)   Wt 225 lb (102.1 kg)   LMP 11/20/2018   BMI 37.44 kg/m  Wt Readings from Last 3 Encounters:  12/06/18 225 lb (102.1 kg)  12/04/18 223 lb (101.2 kg)  11/09/17 193 lb 3 oz (87.6 kg)    Assessment and Plan: 1. Pharyngitis, unspecified etiology -Given exudate and erythema, I am  going to go ahead and treat for bacterial infection.  Discussed with patient that if this is a viral infection antibiotics will not provide symptom improvement. -Discussed using over-the-counter NSAIDs and warm salt water gargles for throat pain -Return to clinic/ER precautions reviewed - penicillin v potassium (VEETID) 500 MG tablet; Take 1 tablet (500 mg total) by mouth 2 (two) times daily between meals for 10 days. Important to finish all of medicine.  Dispense: 20 tablet; Refill: 0  2. Nasal congestion -Discussed use of over-the-counter decongestants and antihistamines as needed   Clarene Reamer, FNP-BC  [facility], [place]  12/06/2018 10:36 AM   Follow Up Instructions: Visit  recap sent to patient via MyChart with recommendations for symptomatic treatment   I discussed the assessment and treatment plan with the patient. The patient was provided an opportunity to ask questions and all were answered. The patient agreed with the plan and demonstrated an understanding of the instructions.   The patient was advised to call back or seek an in-person evaluation if the symptoms worsen or if the condition fails to improve as anticipated.   Elby Beck, FNP

## 2018-12-11 ENCOUNTER — Encounter: Payer: Self-pay | Admitting: Family Medicine

## 2018-12-11 ENCOUNTER — Other Ambulatory Visit: Payer: Self-pay

## 2018-12-11 ENCOUNTER — Ambulatory Visit (INDEPENDENT_AMBULATORY_CARE_PROVIDER_SITE_OTHER): Payer: Managed Care, Other (non HMO) | Admitting: Family Medicine

## 2018-12-11 ENCOUNTER — Ambulatory Visit (INDEPENDENT_AMBULATORY_CARE_PROVIDER_SITE_OTHER)
Admission: RE | Admit: 2018-12-11 | Discharge: 2018-12-11 | Disposition: A | Discharge status: Home / Self Care | Payer: Managed Care, Other (non HMO) | Source: Ambulatory Visit | Attending: Family Medicine | Admitting: Family Medicine

## 2018-12-11 VITALS — BP 100/76 | HR 95 | Temp 98.5°F | Ht 65.0 in | Wt 229.8 lb

## 2018-12-11 DIAGNOSIS — M79671 Pain in right foot: Secondary | ICD-10-CM

## 2018-12-11 DIAGNOSIS — L03119 Cellulitis of unspecified part of limb: Secondary | ICD-10-CM

## 2018-12-11 DIAGNOSIS — S99921A Unspecified injury of right foot, initial encounter: Secondary | ICD-10-CM | POA: Diagnosis not present

## 2018-12-11 MED ORDER — LEVOFLOXACIN 500 MG PO TABS: 500.0000 mg | ORAL_TABLET | Freq: Every day | ORAL | 0 refills | Status: DC

## 2018-12-11 NOTE — Progress Notes (Signed)
Paraskevi Funez T. Analycia Khokhar, MD Primary Care and [place] at [facility] [place], [address] Phone: [phone]  FAX: [phone]  Amy Cabrera - 23 y.o. female  MRN [id number]  Date of Birth: Oct 29, 1995  Visit Date: 12/11/2018  PCP: Jinny Sanders, MD  Referred by: Jinny Sanders, MD  Chief Complaint  Patient presents with  . Foot Pain    Right   Subjective:   Amy Cabrera is a 23 y.o. very pleasant female patient with Body mass index is 38.23 kg/m. who presents with the following:  She is a pleasant 23 year old patient and she presents with some left-sided foot pain and some dorsal redness and warmth that has been present and worsening over the last week.  Prior to this she did kick something and thought she had some toe pain or possible fracture, but this is red and the pain is significantly worse throughout the dorsum of her foot.  R foot, redenned and pain is worse is much worse.  No injury. Kicked something a while pain.  Feels always pop and crack.  No ETOH related.   Past Medical History, Surgical History, Social History, Family History, Problem List, Medications, and Allergies have been reviewed and updated if relevant.  Patient Active Problem List   Diagnosis Date Noted  . Postpartum depression 10/12/2017  . Family history of breast cancer 03/03/2017  . Family history of type 2 diabetes mellitus 03/03/2017  . Allergic rhinitis 10/01/2014  . Low back pain 07/02/2014  . 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 Back pain   . Medical history non-contributory   . Post partum depression     Past Surgical History:  Procedure Laterality Date  . CESAREAN SECTION N/A 08/13/2017   Procedure: CESAREAN SECTION;  Surgeon: Rubie Maid, MD;  Location: [facility] ORS;  Service: Obstetrics;  Laterality: N/A;  . NO PAST SURGERIES    . WISDOM TOOTH EXTRACTION      Social History    Socioeconomic History  . Marital status: Single    Spouse name: Not on file  . Number of children: Not on file  . Years of education: Not on file  . Highest education level: Not on file  Occupational History  . Not on file  Social Needs  . Financial resource strain: Not on file  . Food insecurity    Worry: Not on file    Inability: Not on file  . Transportation needs    Medical: Not on file    Non-medical: Not on file  Tobacco Use  . Smoking status: Current Every Day Smoker    Packs/day: 0.25    Years: 7.00    Pack years: 1.75    Types: Cigarettes  . Smokeless tobacco: Never Used  Substance and Sexual Activity  . Alcohol use: Yes    Alcohol/week: 0.0 standard drinks    Comment: social  . Drug use: No  . Sexual activity: Not Currently    Birth control/protection: None  Lifestyle  . Physical activity    Days per week: Not on file    Minutes per session: Not on file  . Stress: Not on file  Relationships  . Social Herbalist on phone: Not on file    Gets together: Not on file    Attends religious service: Not on file    Active member of club or organization: Not on  file    Attends meetings of clubs or organizations: Not on file    Relationship status: Not on file  . Intimate partner violence    Fear of current or ex partner: Not on file    Emotionally abused: Not on file    Physically abused: Not on file    Forced sexual activity: Not on file  Other Topics Concern  . Not on file  Social History Narrative  . Not on file    Family History  Problem Relation Age of Onset  . Cancer Maternal Grandmother        breast CA  . Heart disease Maternal Grandfather        CAD  . Cancer Maternal Grandfather        colon CA  . Diabetes Maternal Grandfather     No Known Allergies  Medication list reviewed and updated in full in [place] Link.  GEN: No fevers, chills. Nontoxic. Primarily MSK c/o today. MSK: Detailed in the HPI GI: tolerating PO intake  without difficulty Neuro: No numbness, parasthesias, or tingling associated. Otherwise the pertinent positives of the ROS are noted above.   Objective:   BP 100/76   Pulse 95   Temp 98.5 F (36.9 C) (Temporal)   Ht 5\' 5"  (1.651 m)   Wt 229 lb 12 oz (104.2 kg)   LMP 12/10/2018   SpO2 98%   BMI 38.23 kg/m    GEN: WDWN, NAD, Non-toxic, Alert & Oriented x 3 HEENT: Atraumatic, Normocephalic.  Ears and Nose: No external deformity. EXTR: No clubbing/cyanosis/trace edema right lower foot NEURO: Pain with ambulation PSYCH: Normally interactive. Conversant. Not depressed or anxious appearing.  Calm demeanor.   There is no pain at the ankle on the right.  The talus is nontender, lateral medial malleolus, navicular and base of the fifth.  There is significant redness and warmth on the top of the foot and down into the toes.  The point of maximal tenderness is between the fourth and fifth.  Toes.  Radiology: No results found.  Assessment and Plan:     ICD-10-CM   1. Cellulitis of foot  L03.119   2. Acute foot pain, right  M79.671 XR Foot: Right   Clinically consistent with cellulitis of the foot.  Placed on Levaquin secondary to pseudomonal coverage  Follow-up: No follow-ups on file.  Meds ordered this encounter  Medications  . levofloxacin (LEVAQUIN) 500 MG tablet    Sig: Take 1 tablet (500 mg total) by mouth daily for 10 days.    Dispense:  10 tablet    Refill:  0   Orders Placed This Encounter  Procedures  . XR Foot: Right    Signed,  Lyle Niblett T. Roen Macgowan, MD   Outpatient Encounter Medications as of 12/11/2018  Medication Sig  . acetaminophen (TYLENOL) 500 MG tablet Take 500 mg by mouth every 6 (six) hours as needed.  . penicillin v potassium (VEETID) 500 MG tablet Take 1 tablet (500 mg total) by mouth 2 (two) times daily between meals for 10 days. Important to finish all of medicine.  12/13/2018 levofloxacin (LEVAQUIN) 500 MG tablet Take 1 tablet (500 mg total) by mouth  daily for 10 days.  . [DISCONTINUED] ibuprofen (ADVIL,MOTRIN) 800 MG tablet Take 1 tablet (800 mg total) by mouth every 6 (six) hours. (Patient not taking: Reported on 12/06/2018)   No facility-administered encounter medications on file as of 12/11/2018.

## 2018-12-13 ENCOUNTER — Encounter (HOSPITAL_COMMUNITY): Payer: Self-pay | Admitting: Emergency Medicine

## 2018-12-13 ENCOUNTER — Emergency Department (HOSPITAL_COMMUNITY)
Admission: EM | Admit: 2018-12-13 | Discharge: 2018-12-13 | Disposition: A | Discharge status: Home / Self Care | Payer: Managed Care, Other (non HMO) | Attending: Emergency Medicine | Admitting: Emergency Medicine

## 2018-12-13 ENCOUNTER — Other Ambulatory Visit: Payer: Self-pay

## 2018-12-13 DIAGNOSIS — L03115 Cellulitis of right lower limb: Secondary | ICD-10-CM | POA: Diagnosis not present

## 2018-12-13 DIAGNOSIS — L02611 Cutaneous abscess of right foot: Secondary | ICD-10-CM | POA: Diagnosis not present

## 2018-12-13 DIAGNOSIS — R2241 Localized swelling, mass and lump, right lower limb: Secondary | ICD-10-CM | POA: Diagnosis present

## 2018-12-13 DIAGNOSIS — L02619 Cutaneous abscess of unspecified foot: Secondary | ICD-10-CM

## 2018-12-13 DIAGNOSIS — F17210 Nicotine dependence, cigarettes, uncomplicated: Secondary | ICD-10-CM | POA: Insufficient documentation

## 2018-12-13 LAB — CBC WITH DIFFERENTIAL/PLATELET
Abs Immature Granulocytes: 0.1 10*3/uL — ABNORMAL HIGH (ref 0.00–0.07)
Basophils Absolute: 0.1 10*3/uL (ref 0.0–0.1)
Basophils Relative: 1 %
Eosinophils Absolute: 0.1 10*3/uL (ref 0.0–0.5)
Eosinophils Relative: 1 %
HCT: 38.9 % (ref 36.0–46.0)
Hemoglobin: 12.3 g/dL (ref 12.0–15.0)
Immature Granulocytes: 1 %
Lymphocytes Relative: 15 %
Lymphs Abs: 1.8 10*3/uL (ref 0.7–4.0)
MCH: 27.3 pg (ref 26.0–34.0)
MCHC: 31.6 g/dL (ref 30.0–36.0)
MCV: 86.3 fL (ref 80.0–100.0)
Monocytes Absolute: 0.8 10*3/uL (ref 0.1–1.0)
Monocytes Relative: 7 %
Neutro Abs: 9.1 10*3/uL — ABNORMAL HIGH (ref 1.7–7.7)
Neutrophils Relative %: 75 %
Platelets: 205 10*3/uL (ref 150–400)
RBC: 4.51 MIL/uL (ref 3.87–5.11)
RDW: 12.4 % (ref 11.5–15.5)
WBC: 11.9 10*3/uL — ABNORMAL HIGH (ref 4.0–10.5)
nRBC: 0 % (ref 0.0–0.2)

## 2018-12-13 LAB — BASIC METABOLIC PANEL
Anion gap: 9 (ref 5–15)
BUN: 11 mg/dL (ref 6–20)
CO2: 23 mmol/L (ref 22–32)
Calcium: 9 mg/dL (ref 8.9–10.3)
Chloride: 107 mmol/L (ref 98–111)
Creatinine, Ser: 0.59 mg/dL (ref 0.44–1.00)
GFR calc Af Amer: >60 mL/min (ref >60)
GFR calc non Af Amer: >60 mL/min (ref >60)
Glucose, Bld: 101 mg/dL — ABNORMAL HIGH (ref 70–99)
Potassium: 3.7 mmol/L (ref 3.5–5.1)
Sodium: 139 mmol/L (ref 135–145)

## 2018-12-13 MED ORDER — LIDOCAINE HCL (PF) 1 % IJ SOLN
5.0000 mL | Freq: Once | INTRAMUSCULAR | Status: AC
  Administered 2018-12-13: 5 mL
  Filled 2018-12-13: qty 30

## 2018-12-13 MED ORDER — LEVOFLOXACIN IN D5W 500 MG/100ML IV SOLN
500.0000 mg | Freq: Once | INTRAVENOUS | Status: AC
  Administered 2018-12-13: 02:00:00 500 mg via INTRAVENOUS
  Filled 2018-12-13: qty 100

## 2018-12-13 MED ORDER — HYDROCODONE-ACETAMINOPHEN 5-325 MG PO TABS: 1.0000 | ORAL_TABLET | ORAL | 0 refills | Status: DC | PRN

## 2018-12-13 MED ORDER — LEVOFLOXACIN 500 MG PO TABS: 500.0000 mg | ORAL_TABLET | Freq: Every day | ORAL | 0 refills | Status: DC

## 2018-12-13 MED ORDER — HYDROCODONE-ACETAMINOPHEN 5-325 MG PO TABS
1.0000 | ORAL_TABLET | Freq: Once | ORAL | Status: AC
  Administered 2018-12-13: 03:00:00 1 via ORAL
  Filled 2018-12-13: qty 1

## 2018-12-13 NOTE — ED Triage Notes (Signed)
Patient is complaining of right foot infection. Patient states her doctor saw her and he marked where the infection is. The swelling has went out of the marked area. Foot is swelling.

## 2018-12-13 NOTE — ED Provider Notes (Signed)
Medical screening examination/treatment/procedure(s) were conducted as a shared visit with non-physician practitioner(s) and myself.  I personally evaluated the patient during the encounter.  Leg swelling and pain to right dorsal foot on levaquin, not improving.   Exam with abscess between 4/5 digits onright foot with cellulitis almost to ankle. Agree with I&D and IV Abx.         Hillari Zumwalt, Corene Cornea, MD 12/14/18 [phone]

## 2018-12-13 NOTE — ED Provider Notes (Signed)
[facility]-EMERGENCY DEPT Provider Note   CSN: [id number] Arrival date & time: 12/13/18  0029     History   Chief Complaint Chief Complaint  Patient presents with  . Leg Swelling    HPI Amy Cabrera is a 23 y.o. female.     Patient to ED with complaint of worsening foot infection. She was evaluated on 12/11/18 at Urgent Care and started on Levaquin for right foot cellulitis. The borders of the infection were marked and are now beyond those borders, causing concern and prompting ED visit. No fever. She reports mild nausea without vomiting. She has been compliant with her medications.   The history is provided by the patient. No language interpreter was used.    Past Medical History:  Diagnosis Date  . Back pain   . Medical history non-contributory   . Post partum depression     Patient Active Problem List   Diagnosis Date Noted  . Postpartum depression 10/12/2017  . Family history of breast cancer 03/03/2017  . Family history of type 2 diabetes mellitus 03/03/2017  . Allergic rhinitis 10/01/2014  . Low back pain 07/02/2014  . 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 CESAREAN SECTION N/A 08/13/2017   Procedure: CESAREAN SECTION;  Surgeon: Hildred Laserherry, Anika, MD;  Location: [facility] ORS;  Service: Obstetrics;  Laterality: N/A;  . NO PAST SURGERIES    . WISDOM TOOTH EXTRACTION       OB History    Gravida  1   Para  1   Term  1   Preterm  0   AB  0   Living  1     SAB  0   TAB  0   Ectopic  0   Multiple  0   Live Births  1            Home Medications    Prior to Admission medications   Medication Sig Start Date End Date Taking? Authorizing Provider  acetaminophen (TYLENOL) 500 MG tablet Take 500 mg by mouth every 6 (six) hours as needed.    [provider]  levofloxacin (LEVAQUIN) 500 MG tablet Take 1 tablet (500 mg total) by mouth daily for 10 days. 12/11/18 12/21/18  Copland,  Karleen HampshireSpencer, MD  penicillin v potassium (VEETID) 500 MG tablet Take 1 tablet (500 mg total) by mouth 2 (two) times daily between meals for 10 days. Important to finish all of medicine. 12/06/18 12/16/18  Emi BelfastGessner, Deborah B, FNP    Family History Family History  Problem Relation Age of Onset  . Cancer Maternal Grandmother        breast CA  . Heart disease Maternal Grandfather        CAD  . Cancer Maternal Grandfather        colon CA  . Diabetes Maternal Grandfather     Social History Social History   Tobacco Use  . Smoking status: Current Every Day Smoker    Packs/day: 0.25    Years: 7.00    Pack years: 1.75    Types: Cigarettes  . Smokeless tobacco: Never Used  Substance Use Topics  . Alcohol use: Yes    Alcohol/week: 0.0 standard drinks    Comment: social  . Drug use: No     Allergies   Patient has no known allergies.   Review of Systems Review of Systems  Constitutional: Negative for fever.  HENT: Negative.   Respiratory: Negative.  Cardiovascular: Negative.   Gastrointestinal: Positive for nausea. Negative for vomiting.  Musculoskeletal:       See HPI.  Skin: Positive for color change and wound.  Neurological: Negative for numbness.     Physical Exam Updated Vital Signs BP 118/76 (BP Location: Left Arm)   Pulse 98   Temp 98.2 F (36.8 C) (Oral)   Resp 14   Ht 5\' 6"  (1.676 m)   Wt 103.9 kg   LMP 12/10/2018   SpO2 99%   BMI 36.96 kg/m   Physical Exam Vitals signs and nursing note reviewed.  Constitutional:      Appearance: She is well-developed.  Neck:     Musculoskeletal: Normal range of motion.  Pulmonary:     Effort: Pulmonary effort is normal.  Musculoskeletal:     Comments: Right foot moderately swollen over dorsum with fluctuant discolored area at the interphalangeal space of 4th/5th toes associated with plantar erythema. No open wound or drainage. Skin marker border at dorsal mid-forefoot with erythema now extending to ankle .  Skin:     General: Skin is warm and dry.  Neurological:     Mental Status: She is alert and oriented to person, place, and time.      ED Treatments / Results  Labs (all labs ordered are listed, but only abnormal results are displayed) Labs Reviewed  CBC WITH DIFFERENTIAL/PLATELET  BASIC METABOLIC PANEL    EKG None  Radiology Xr Foot: Right  Result Date: 12/11/2018 CLINICAL DATA:  Right foot injury and pain.  Initial encounter. EXAM: RIGHT FOOT COMPLETE - 3+ VIEW COMPARISON:  None. FINDINGS: There is no evidence of fracture or dislocation. There is no evidence of arthropathy or other focal bone abnormality. Soft tissues are unremarkable. IMPRESSION: Negative. Electronically Signed   By: Marlaine Hind M.D.   On: 12/11/2018 19:56    Procedures .Marland KitchenIncision and Drainage  Date/Time: 12/13/2018 2:51 AM Performed by: Charlann Lange, PA-C Authorized by: Charlann Lange, PA-C   Consent:    Consent obtained:  Verbal   Consent given by:  Patient Location:    Type:  Abscess   Location:  Lower extremity   Lower extremity location:  Foot Pre-procedure details:    Skin preparation:  Betadine Anesthesia (see MAR for exact dosages):    Anesthesia method:  Local infiltration   Local anesthetic:  Lidocaine 1% w/o epi Procedure type:    Complexity:  Simple Procedure details:    Needle aspiration: no     Incision types:  Stab incision   Scalpel blade:  11   Wound management:  Irrigated with saline   Drainage:  Purulent and bloody   Drainage amount:  Moderate   Wound treatment:  Wound left open   Packing materials:  None Post-procedure details:    Patient tolerance of procedure:  Tolerated well, no immediate complications   (including critical care time)  Medications Ordered in ED Medications  levofloxacin (LEVAQUIN) IVPB 500 mg (has no administration in time range)  lidocaine (PF) (XYLOCAINE) 1 % injection 5 mL (has no administration in time range)     Initial Impression / Assessment  and Plan / ED Course  I have reviewed the triage vital signs and the nursing notes.  Pertinent labs & imaging results that were available during my care of the patient were reviewed by me and considered in my medical decision making (see chart for details).        Patient to ED with worsening right foot infection as evidenced  by increased pain, redness extending beyond marked skin border, and abscess at toe base. NO fever.   Labs are reassuring. No fever. I&D as per above note. IV Levaquin provided. Will continue PO levaquin adding 2 additional days to give her a 10-day course from today. Will provide pain relief. REturn precautions discussed.     Final Clinical Impressions(s) / ED Diagnoses   Final diagnoses:  None   1. Foot abscess  ED Discharge Orders    None       Elpidio Anis, Cordelia Poche 12/13/18 0253    Marily Memos, MD 12/13/18 [phone]

## 2018-12-13 NOTE — Discharge Instructions (Signed)
Continue taking Levaquin to include 10 days from today. Norco for pain as directed.   Follow up with your doctor in 2 days for recheck. Return to the emergency department if symptoms worsen - increasing pain or redness, fever.

## 2018-12-15 ENCOUNTER — Ambulatory Visit (INDEPENDENT_AMBULATORY_CARE_PROVIDER_SITE_OTHER): Payer: Managed Care, Other (non HMO) | Admitting: Family Medicine

## 2018-12-15 ENCOUNTER — Other Ambulatory Visit: Payer: Self-pay

## 2018-12-15 ENCOUNTER — Encounter: Payer: Self-pay | Admitting: Family Medicine

## 2018-12-15 VITALS — BP 100/64 | HR 93 | Temp 98.2°F | Ht 65.0 in | Wt 225.0 lb

## 2018-12-15 DIAGNOSIS — L02611 Cutaneous abscess of right foot: Secondary | ICD-10-CM

## 2018-12-15 MED ORDER — SULFAMETHOXAZOLE-TRIMETHOPRIM 800-160 MG PO TABS: 2.0000 | ORAL_TABLET | Freq: Two times a day (BID) | ORAL | 0 refills | Status: AC

## 2018-12-15 MED ORDER — LEVOFLOXACIN 500 MG PO TABS: 500.0000 mg | ORAL_TABLET | Freq: Every day | ORAL | 0 refills | Status: AC

## 2018-12-15 NOTE — Progress Notes (Signed)
Kiwan Gadsden T. Cledith Abdou, MD Primary Care and Sports Medicine [facility] at [facility] [address] [place], [address] Phone: [phone]  FAX: [phone]  Amy Cabrera - 23 y.o. female  MRN [id number]  Date of Birth: 17-May-1995  Visit Date: 12/15/2018  PCP: Excell Seltzer, MD  Referred by: Excell Seltzer, MD  Chief Complaint  Patient presents with  . Follow-up    Abscess of foot-Seen in ED 12/13/2018    This visit occurred during the SARS-CoV-2 public health emergency.  Safety protocols were in place, including screening questions prior to the visit, additional usage of staff PPE, and extensive cleaning of exam room while observing appropriate contact time as indicated for disinfecting solutions.   Subjective:   Amy Cabrera is a 23 y.o. very pleasant female patient who presents with the following:  F/u ER for foot cellulitis:  No cx.  Amy Cabrera is here in follow-up for her left-sided cellulitis and abscess.  Initially when I saw her it appeared to be cellulitis, and her pain worsened and she went to the ER.  They did drain this and some pus came out.  It has since been open and draining.  A culture was not obtained originally.  The redness and warmth is decreased compared to the line drawn in the ER.  Add septra ds, 2 po bid  Past Medical History, Surgical History, Social History, Family History, Problem List, Medications, and Allergies have been reviewed and updated if relevant.  Patient Active Problem List   Diagnosis Date Noted  . Postpartum depression 10/12/2017  . Family history of breast cancer 03/03/2017  . Family history of type 2 diabetes mellitus 03/03/2017  . Allergic rhinitis 10/01/2014  . Low back pain 07/02/2014  . 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 Back pain   . Medical history non-contributory   . Post partum depression     Past Surgical History:  Procedure Laterality Date  .  CESAREAN SECTION N/A 08/13/2017   Procedure: CESAREAN SECTION;  Surgeon: Hildred Laser, MD;  Location: [facility] ORS;  Service: Obstetrics;  Laterality: N/A;  . NO PAST SURGERIES    . WISDOM TOOTH EXTRACTION      Social History   Socioeconomic History  . Marital status: Single    Spouse name: Not on file  . Number of children: Not on file  . Years of education: Not on file  . Highest education level: Not on file  Occupational History  . Not on file  Social Needs  . Financial resource strain: Not on file  . Food insecurity    Worry: Not on file    Inability: Not on file  . Transportation needs    Medical: Not on file    Non-medical: Not on file  Tobacco Use  . Smoking status: Current Every Day Smoker    Packs/day: 0.25    Years: 7.00    Pack years: 1.75    Types: Cigarettes  . Smokeless tobacco: Never Used  Substance and Sexual Activity  . Alcohol use: Yes    Alcohol/week: 0.0 standard drinks    Comment: social  . Drug use: No  . Sexual activity: Not Currently    Birth control/protection: None  Lifestyle  . Physical activity    Days per week: Not on file    Minutes per session: Not on file  . Stress: Not on file  Relationships  . Social connections  Talks on phone: Not on file    Gets together: Not on file    Attends religious service: Not on file    Active member of club or organization: Not on file    Attends meetings of clubs or organizations: Not on file    Relationship status: Not on file  . Intimate partner violence    Fear of current or ex partner: Not on file    Emotionally abused: Not on file    Physically abused: Not on file    Forced sexual activity: Not on file  Other Topics Concern  . Not on file  Social History Narrative  . Not on file    Family History  Problem Relation Age of Onset  . Cancer Maternal Grandmother        breast CA  . Heart disease Maternal Grandfather        CAD  . Cancer Maternal Grandfather        colon CA  . Diabetes  Maternal Grandfather     No Known Allergies  Medication list reviewed and updated in full in [place].  ROS: GEN: Acute illness details above GI: Tolerating PO intake GU: maintaining adequate hydration and urination Pulm: No SOB Interactive and getting along well at home.  Otherwise, ROS is as per the HPI.  Objective   BP 100/64   Pulse 93   Temp 98.2 F (36.8 C) (Temporal)   Ht 5\' 5"  (1.651 m)   Wt 225 lb (102.1 kg)   LMP 12/10/2018   SpO2 97%   BMI 37.44 kg/m    GEN: WDWN, NAD, Non-toxic, Alert & Oriented x 3 HEENT: Atraumatic, Normocephalic.  Ears and Nose: No external deformity. EXTR: No clubbing/cyanosis/edema NEURO: Normal gait.  PSYCH: Normally interactive. Conversant. Not depressed or anxious appearing.  Calm demeanor.    Left foot does have decreased redness and warmth compared to prior lines drawn.  There is a draining area between first and second digit in the webspace, and this was expressed.  Sent for culture.   Laboratory and Imaging Data:  Assessment and Plan:     ICD-10-CM   1. Abscess of right foot  L02.611 Wound culture   Improving, but I do want her rechecked her.  I place her on Bactrim as well to cover potential MRSA. Continue Levaquin for pseudomonal coverage  Follow-up: Return in about 3 days (around 12/18/2018).  Meds ordered this encounter  Medications  . sulfamethoxazole-trimethoprim (BACTRIM DS) 800-160 MG tablet    Sig: Take 2 tablets by mouth 2 (two) times daily for 10 days.    Dispense:  40 tablet    Refill:  0  . levofloxacin (LEVAQUIN) 500 MG tablet    Sig: Take 1 tablet (500 mg total) by mouth daily for 10 days.    Dispense:  2 tablet    Refill:  0   Orders Placed This Encounter  Procedures  . Wound culture    Signed,  Frederico Hamman T. Avneet Ashmore, MD   Outpatient Encounter Medications as of 12/15/2018  Medication Sig  . acetaminophen (TYLENOL) 500 MG tablet Take 500 mg by mouth every 6 (six) hours as needed for  mild pain.   Marland Kitchen HYDROcodone-acetaminophen (NORCO/VICODIN) 5-325 MG tablet Take 1 tablet by mouth every 4 (four) hours as needed for severe pain.  . [EXPIRED] penicillin v potassium (VEETID) 500 MG tablet Take 1 tablet (500 mg total) by mouth 2 (two) times daily between meals for 10 days. Important to finish  all of medicine.  . [DISCONTINUED] levofloxacin (LEVAQUIN) 500 MG tablet Take 1 tablet (500 mg total) by mouth daily.  Marland Kitchen. levofloxacin (LEVAQUIN) 500 MG tablet Take 1 tablet (500 mg total) by mouth daily for 10 days.  Marland Kitchen. sulfamethoxazole-trimethoprim (BACTRIM DS) 800-160 MG tablet Take 2 tablets by mouth 2 (two) times daily for 10 days.   No facility-administered encounter medications on file as of 12/15/2018.

## 2018-12-18 ENCOUNTER — Other Ambulatory Visit: Payer: Self-pay

## 2018-12-18 ENCOUNTER — Ambulatory Visit (INDEPENDENT_AMBULATORY_CARE_PROVIDER_SITE_OTHER): Payer: Managed Care, Other (non HMO) | Admitting: Family Medicine

## 2018-12-18 ENCOUNTER — Other Ambulatory Visit: Payer: Self-pay | Admitting: Physician Assistant

## 2018-12-18 ENCOUNTER — Ambulatory Visit (INDEPENDENT_AMBULATORY_CARE_PROVIDER_SITE_OTHER): Payer: Managed Care, Other (non HMO) | Admitting: Orthopedic Surgery

## 2018-12-18 ENCOUNTER — Other Ambulatory Visit (HOSPITAL_COMMUNITY)
Admission: RE | Admit: 2018-12-18 | Discharge: 2018-12-18 | Disposition: A | Discharge status: Home / Self Care | Payer: Managed Care, Other (non HMO) | Source: Ambulatory Visit | Attending: Orthopedic Surgery | Admitting: Orthopedic Surgery

## 2018-12-18 ENCOUNTER — Encounter: Payer: Self-pay | Admitting: Physician Assistant

## 2018-12-18 ENCOUNTER — Encounter: Payer: Self-pay | Admitting: Family Medicine

## 2018-12-18 VITALS — BP 90/62 | HR 85 | Temp 97.6°F | Ht 65.0 in | Wt 224.5 lb

## 2018-12-18 VITALS — Ht 65.0 in | Wt 224.0 lb

## 2018-12-18 DIAGNOSIS — Z20828 Contact with and (suspected) exposure to other viral communicable diseases: Secondary | ICD-10-CM | POA: Diagnosis not present

## 2018-12-18 DIAGNOSIS — L03119 Cellulitis of unspecified part of limb: Secondary | ICD-10-CM

## 2018-12-18 DIAGNOSIS — Z01812 Encounter for preprocedural laboratory examination: Secondary | ICD-10-CM | POA: Insufficient documentation

## 2018-12-18 DIAGNOSIS — L02611 Cutaneous abscess of right foot: Secondary | ICD-10-CM

## 2018-12-18 DIAGNOSIS — M79671 Pain in right foot: Secondary | ICD-10-CM | POA: Diagnosis not present

## 2018-12-18 LAB — WOUND CULTURE
MICRO NUMBER:: 1165002
RESULT:: NO GROWTH
SPECIMEN QUALITY:: ADEQUATE

## 2018-12-18 LAB — SARS CORONAVIRUS 2 (TAT 6-24 HRS): SARS Coronavirus 2: NEGATIVE

## 2018-12-18 NOTE — Progress Notes (Signed)
Amy Taber T. Donivin Wirt, MD Primary Care and [place] at [facility] [place], [address] Phone: [phone]  FAX: [place] - 23 y.o. female  MRN [id number]  Date of Birth: 1995/06/03  Visit Date: 12/18/2018  PCP: Jinny Sanders, MD  Referred by: Jinny Sanders, MD  No chief complaint on file.   This visit occurred during the SARS-CoV-2 public health emergency.  Safety protocols were in place, including screening questions prior to the visit, additional usage of staff PPE, and extensive cleaning of exam room while observing appropriate contact time as indicated for disinfecting solutions.   Subjective:   Amy Cabrera is a 23 y.o. very pleasant female patient who presents with the following:  11/30 initial visit 12/2 ER visit with I and D  She is a very nice young lady and I saw her initially and her foot appears if she has some cellulitis.  This progressed and she had some self drainage and ultimately went to the ER on December 2.  They did do an I&D there and got some pus out.  She followed up with me 2 days later.  I was able to express some pus and sent it for culture, but this was after being on multiple days of Levaquin.  At this point the culture has not returned with any specific organism.  12/15/2018 Last OV with Owens Loffler, MD F/u ER for foot cellulitis:  No cx.  Amy Cabrera is here in follow-up for her R-sided cellulitis and abscess.  Initially when I saw her it appeared to be cellulitis, and her pain worsened and she went to the ER.  They did drain this and some pus came out.  It has since been open and draining.  A culture was not obtained originally.  The redness and warmth is decreased compared to the line drawn in the ER.   Add septra ds, 2 po bid   Past Medical History, Surgical History, Social History, Family History, Problem List, Medications, and Allergies have been reviewed and  updated if relevant.  Patient Active Problem List   Diagnosis Date Noted  . Postpartum depression 10/12/2017  . Family history of breast cancer 03/03/2017  . Family history of type 2 diabetes mellitus 03/03/2017  . Allergic rhinitis 10/01/2014  . Low back pain 07/02/2014  . 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 Back pain   . Medical history non-contributory   . Post partum depression     Past Surgical History:  Procedure Laterality Date  . CESAREAN SECTION N/A 08/13/2017   Procedure: CESAREAN SECTION;  Surgeon: Rubie Maid, MD;  Location: [facility] ORS;  Service: Obstetrics;  Laterality: N/A;  . NO PAST SURGERIES    . WISDOM TOOTH EXTRACTION      Social History   Socioeconomic History  . Marital status: Single    Spouse name: Not on file  . Number of children: Not on file  . Years of education: Not on file  . Highest education level: Not on file  Occupational History  . Not on file  Social Needs  . Financial resource strain: Not on file  . Food insecurity    Worry: Not on file    Inability: Not on file  . Transportation needs    Medical: Not on file    Non-medical: Not on file  Tobacco Use  . Smoking status: Current Every Day  Smoker    Packs/day: 0.25    Years: 7.00    Pack years: 1.75    Types: Cigarettes  . Smokeless tobacco: Never Used  Substance and Sexual Activity  . Alcohol use: Yes    Alcohol/week: 0.0 standard drinks    Comment: social  . Drug use: No  . Sexual activity: Not Currently    Birth control/protection: None  Lifestyle  . Physical activity    Days per week: Not on file    Minutes per session: Not on file  . Stress: Not on file  Relationships  . Social Musicianconnections    Talks on phone: Not on file    Gets together: Not on file    Attends religious service: Not on file    Active member of club or organization: Not on file    Attends meetings of clubs or organizations: Not on file    Relationship status: Not on  file  . Intimate partner violence    Fear of current or ex partner: Not on file    Emotionally abused: Not on file    Physically abused: Not on file    Forced sexual activity: Not on file  Other Topics Concern  . Not on file  Social History Narrative  . Not on file    Family History  Problem Relation Age of Onset  . Cancer Maternal Grandmother        breast CA  . Heart disease Maternal Grandfather        CAD  . Cancer Maternal Grandfather        colon CA  . Diabetes Maternal Grandfather     No Known Allergies  Medication list reviewed and updated in full in [facility].   GEN: No acute illnesses, no fevers, chills. GI: No n/v/d, eating normally Pulm: No SOB Interactive and getting along well at home.  Otherwise, ROS is as per the HPI.  Objective:   BP 90/62   Pulse 85   Temp 97.6 F (36.4 C) (Temporal)   Ht 5\' 5"  (1.651 m)   Wt 224 lb 8 oz (101.8 kg)   LMP 12/10/2018   SpO2 98%   BMI 37.36 kg/m   GEN: WDWN, NAD, Non-toxic, A & O x 3 HEENT: Atraumatic, Normocephalic. Neck supple. No masses, No LAD. Ears and Nose: No external deformity. EXTR: No c/c/e NEURO antalgic gait.  PSYCH: Normally interactive. Conversant. Not depressed or anxious appearing.  Calm demeanor.       Laboratory and Imaging Data: Results for orders placed or performed in visit on 12/15/18  Wound culture   Specimen: Foot, Right; Wound  Result Value Ref Range   MICRO NUMBER: [id number]    SPECIMEN QUALITY: Adequate    SOURCE: WOUND (SITE NOT SPECIFIED)    STATUS: PRELIMINARY    GRAM STAIN:      No white blood cells seen No epithelial cells seen No organisms seen   RESULT: No Growth      Assessment and Plan:     ICD-10-CM   1. Abscess of right foot  L02.611 Ambulatory referral to Orthopedic Surgery  2. Acute foot pain, right  M79.671 Ambulatory referral to Orthopedic Surgery  3. Cellulitis of foot  L03.119 Ambulatory referral to Orthopedic Surgery   Level of Medical  Decision-Making in this case is Moderate.  At this point, she is somewhat better compared to Friday, but her symptoms are still persistent, she is having some pus draining from between the toes.  She also is having some persistent warmth and redness in the distal foot.  I think that she needs to have a higher level of care, and I am going to have her see one of the foot and ankle surgeons today or tomorrow  Follow-up: No follow-ups on file.  No orders of the defined types were placed in this encounter.  Orders Placed This Encounter  Procedures  . Ambulatory referral to Orthopedic Surgery    Signed,  Karleen Hampshire T. Siera Beyersdorf, MD   Outpatient Encounter Medications as of 12/18/2018  Medication Sig  . acetaminophen (TYLENOL) 500 MG tablet Take 500 mg by mouth every 6 (six) hours as needed for mild pain.   Marland Kitchen HYDROcodone-acetaminophen (NORCO/VICODIN) 5-325 MG tablet Take 1 tablet by mouth every 4 (four) hours as needed for severe pain.  Marland Kitchen levofloxacin (LEVAQUIN) 500 MG tablet Take 1 tablet (500 mg total) by mouth daily for 10 days.  Marland Kitchen sulfamethoxazole-trimethoprim (BACTRIM DS) 800-160 MG tablet Take 2 tablets by mouth 2 (two) times daily for 10 days.   No facility-administered encounter medications on file as of 12/18/2018.

## 2018-12-18 NOTE — Patient Instructions (Signed)
We are going to make you an appointment with one of the foot and ankle surgeons

## 2018-12-18 NOTE — Addendum Note (Signed)
Addended by: Pamella Pert on: 12/18/2018 02:11 PM   Modules accepted: Orders

## 2018-12-18 NOTE — Progress Notes (Signed)
Office Visit Note   Patient: Amy Cabrera           Date of Birth: [birth year]           MRN: [id number] Visit Date: 12/18/2018              Requested by: Jinny Sanders, MD [place],  [place] [address] PCP: Jinny Sanders, MD  Chief Complaint  Patient presents with  . Right Foot - Pain      HPI: Patient is a 23 year old woman with a 2-week history of an abscess dorsum of the right foot.  Patient states she had extensive cellulitis she was started on Levaquin and then was switched over to Septra DS twice a day.  Patient states the cultures have been negative.  Patient has a persistent draining ulcer over the dorsum of the MTP joint right fourth toe.  Patient states she has a history of diabetes in her grandmother and is a daily smoker.  She is postpartum  Assessment & Plan: Visit Diagnoses: No diagnosis found.  Plan: With the persistent draining infection discussed that we will need to proceed with surgery for debridement of the abscess will obtain deep cultures.  Risks and benefits were discussed including persistent infection need for additional surgery.  Patient states she understands wished to proceed at this time plan for surgery on Wednesday overnight observation.  We will draw a hemoglobin A1c today.  Follow-Up Instructions: No follow-ups on file.   Ortho Exam  Patient is alert, oriented, no adenopathy, well-dressed, normal affect, normal respiratory effort. Examination patient does have swelling of the right foot I cannot palpate a pulse the Doppler was used and she has a good triphasic dorsalis pedis and posterior tibial pulse.  There is an outline drawn on her foot and ankle which encompasses the entire dorsum of the foot and ankle.  The skin is now wrinkling the cellulitis has resolved to an area about 2 cm in diameter and there is draining infection from the dorsum of the MTP joint fourth toe.  Review of the three-view radiographs of the right foot  does not show any destructive bony changes.  Patient is currently wearing slide on sandals.  Imaging: No results found. No images are attached to the encounter.  Labs: Lab Results  Component Value Date   REPTSTATUS 12/06/2018 FINAL 12/04/2018   CULT  12/04/2018    NO GROUP A STREP (S.PYOGENES) ISOLATED Performed at [facility], [place] [address] [place], [place] [address]    LABORGA STAPHYLOCOCCUS SAPROPHYTICUS (A) 10/13/2016     Lab Results  Component Value Date   ALBUMIN 4.4 12/31/2016    No results found for: MG No results found for: VD25OH  No results found for: PREALBUMIN CBC EXTENDED Latest Ref Rng & Units 12/13/2018 08/15/2017 08/14/2017  WBC 4.0 - 10.5 K/uL 11.9(H) 17.1(H) 33.1(H)  RBC 3.87 - 5.11 MIL/uL 4.51 3.20(L) 3.85  HGB 12.0 - 15.0 g/dL 12.3 9.0(L) 11.0(L)  HCT 36.0 - 46.0 % 38.9 27.0(L) 32.2(L)  PLT 150 - 400 K/uL 205 141(L) 174  NEUTROABS 1.7 - 7.7 K/uL 9.1(H) - -  LYMPHSABS 0.7 - 4.0 K/uL 1.8 - -     Body mass index is 37.28 kg/m.  Orders:  No orders of the defined types were placed in this encounter.  No orders of the defined types were placed in this encounter.    Procedures: No procedures performed  Clinical Data: No additional findings.  ROS:  All other systems negative, except as noted in the HPI. Review of Systems  Objective: Vital Signs: Ht 5\' 5"  (1.651 m)   Wt 224 lb (101.6 kg)   LMP 12/10/2018   BMI 37.28 kg/m   Specialty Comments:  No specialty comments available.  PMFS History: Patient Active Problem List   Diagnosis Date Noted  . Postpartum depression 10/12/2017  . Family history of breast cancer 03/03/2017  . Family history of type 2 diabetes mellitus 03/03/2017  . Allergic rhinitis 10/01/2014  . Low back pain 07/02/2014  . 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 Back pain   . Medical history non-contributory   . Post partum depression     Family History  Problem Relation Age  of Onset  . Cancer Maternal Grandmother        breast CA  . Heart disease Maternal Grandfather        CAD  . Cancer Maternal Grandfather        colon CA  . Diabetes Maternal Grandfather     Past Surgical History:  Procedure Laterality Date  . CESAREAN SECTION N/A 08/13/2017   Procedure: CESAREAN SECTION;  Surgeon: 10/13/2017, MD;  Location: [facility] ORS;  Service: Obstetrics;  Laterality: N/A;  . NO PAST SURGERIES    . WISDOM TOOTH EXTRACTION     Social History   Occupational History  . Not on file  Tobacco Use  . Smoking status: Current Every Day Smoker    Packs/day: 0.25    Years: 7.00    Pack years: 1.75    Types: Cigarettes  . Smokeless tobacco: Never Used  Substance and Sexual Activity  . Alcohol use: Yes    Alcohol/week: 0.0 standard drinks    Comment: social  . Drug use: No  . Sexual activity: Not Currently    Birth control/protection: None

## 2018-12-19 ENCOUNTER — Other Ambulatory Visit: Payer: Self-pay

## 2018-12-19 ENCOUNTER — Encounter (HOSPITAL_COMMUNITY): Payer: Self-pay | Admitting: *Deleted

## 2018-12-19 NOTE — Progress Notes (Signed)
Amy Cabrera denies chest pain or shortness of breath. Patient tested negative for Covid 12/18/2018 and reports that she has been in the home with only the people she lives with.  Amy Cabrera reports that she does not live in [place] and will not be able to pick up Pre- Surgery Ensure.I instructed patient to not eat anything after midnight the night before surgery, that includes chewing gum, hard candy.  I instructed patient to drink clear liquids up until 3 hours prior to surgery. I went over what clear liquids are, I encouraged patient to take medication before liquid cut off time. Amy Cabrera reports that she drinks water, tea and Mt. Dew; I told her that those are clear and she may drink until 0800.

## 2018-12-20 ENCOUNTER — Ambulatory Visit (HOSPITAL_COMMUNITY): Payer: Managed Care, Other (non HMO) | Admitting: Physician Assistant

## 2018-12-20 ENCOUNTER — Encounter (HOSPITAL_COMMUNITY): Payer: Self-pay

## 2018-12-20 ENCOUNTER — Ambulatory Visit (HOSPITAL_COMMUNITY)
Admission: RE | Admit: 2018-12-20 | Discharge: 2018-12-20 | Disposition: A | Discharge status: Home / Self Care | Payer: Managed Care, Other (non HMO) | Attending: Orthopedic Surgery | Admitting: Orthopedic Surgery

## 2018-12-20 ENCOUNTER — Encounter (HOSPITAL_COMMUNITY)
Admission: RE | Disposition: A | Discharge status: Home / Self Care | Payer: Self-pay | Source: Home / Self Care | Attending: Orthopedic Surgery

## 2018-12-20 ENCOUNTER — Other Ambulatory Visit: Payer: Self-pay

## 2018-12-20 DIAGNOSIS — F17210 Nicotine dependence, cigarettes, uncomplicated: Secondary | ICD-10-CM | POA: Diagnosis not present

## 2018-12-20 DIAGNOSIS — L02611 Cutaneous abscess of right foot: Secondary | ICD-10-CM | POA: Diagnosis not present

## 2018-12-20 HISTORY — PX: I&D EXTREMITY: SHX5045

## 2018-12-20 LAB — POCT PREGNANCY, URINE: Preg Test, Ur: NEGATIVE

## 2018-12-20 LAB — HEMOGLOBIN: Hemoglobin: 13.7 g/dL (ref 12.0–15.0)

## 2018-12-20 SURGERY — IRRIGATION AND DEBRIDEMENT EXTREMITY
Anesthesia: General | Laterality: Right

## 2018-12-20 MED ORDER — HYDROMORPHONE HCL 1 MG/ML IJ SOLN
0.2500 mg | INTRAMUSCULAR | Status: DC | PRN
  Administered 2018-12-20 (×4): 0.5 mg via INTRAVENOUS

## 2018-12-20 MED ORDER — 0.9 % SODIUM CHLORIDE (POUR BTL) OPTIME
TOPICAL | Status: DC | PRN
  Administered 2018-12-20: 1000 mL

## 2018-12-20 MED ORDER — OXYCODONE HCL 5 MG PO TABS
5.0000 mg | ORAL_TABLET | Freq: Once | ORAL | Status: AC | PRN
  Administered 2018-12-20: 13:00:00 5 mg via ORAL

## 2018-12-20 MED ORDER — DEXMEDETOMIDINE HCL IN NACL 200 MCG/50ML IV SOLN
INTRAVENOUS | Status: DC | PRN
  Administered 2018-12-20: 12 ug via INTRAVENOUS
  Administered 2018-12-20: 8 ug via INTRAVENOUS
  Administered 2018-12-20: 20 ug via INTRAVENOUS

## 2018-12-20 MED ORDER — LIDOCAINE 2% (20 MG/ML) 5 ML SYRINGE
INTRAMUSCULAR | Status: DC | PRN
  Administered 2018-12-20: 100 mg via INTRAVENOUS

## 2018-12-20 MED ORDER — OXYCODONE HCL 5 MG PO TABS
ORAL_TABLET | ORAL | Status: AC
  Filled 2018-12-20: qty 1

## 2018-12-20 MED ORDER — CHLORHEXIDINE GLUCONATE 4 % EX LIQD: 60.0000 mL | Freq: Once | CUTANEOUS | Status: DC

## 2018-12-20 MED ORDER — OXYCODONE HCL 5 MG/5ML PO SOLN: 5.0000 mg | Freq: Once | ORAL | Status: AC | PRN

## 2018-12-20 MED ORDER — OXYCODONE-ACETAMINOPHEN 5-325 MG PO TABS: 1.0000 | ORAL_TABLET | ORAL | 0 refills | Status: DC | PRN

## 2018-12-20 MED ORDER — LACTATED RINGERS IV SOLN
INTRAVENOUS | Status: DC
  Administered 2018-12-20 (×2): via INTRAVENOUS

## 2018-12-20 MED ORDER — ONDANSETRON HCL 4 MG/2ML IJ SOLN
INTRAMUSCULAR | Status: DC | PRN
  Administered 2018-12-20: 4 mg via INTRAVENOUS

## 2018-12-20 MED ORDER — CEFAZOLIN SODIUM-DEXTROSE 2-4 GM/100ML-% IV SOLN
2.0000 g | INTRAVENOUS | Status: AC
  Administered 2018-12-20: 2 g via INTRAVENOUS

## 2018-12-20 MED ORDER — DEXAMETHASONE SODIUM PHOSPHATE 10 MG/ML IJ SOLN
INTRAMUSCULAR | Status: DC | PRN
  Administered 2018-12-20: 10 mg via INTRAVENOUS

## 2018-12-20 MED ORDER — FENTANYL CITRATE (PF) 100 MCG/2ML IJ SOLN
INTRAMUSCULAR | Status: DC | PRN
  Administered 2018-12-20: 100 ug via INTRAVENOUS
  Administered 2018-12-20 (×3): 50 ug via INTRAVENOUS

## 2018-12-20 MED ORDER — HYDROMORPHONE HCL 1 MG/ML IJ SOLN
INTRAMUSCULAR | Status: AC
  Filled 2018-12-20: qty 1

## 2018-12-20 MED ORDER — MIDAZOLAM HCL 5 MG/5ML IJ SOLN
INTRAMUSCULAR | Status: DC | PRN
  Administered 2018-12-20: 2 mg via INTRAVENOUS

## 2018-12-20 MED ORDER — FENTANYL CITRATE (PF) 250 MCG/5ML IJ SOLN
INTRAMUSCULAR | Status: AC
  Filled 2018-12-20: qty 5

## 2018-12-20 MED ORDER — CEFAZOLIN SODIUM-DEXTROSE 2-4 GM/100ML-% IV SOLN
INTRAVENOUS | Status: AC
  Filled 2018-12-20: qty 100

## 2018-12-20 MED ORDER — ALBUTEROL SULFATE HFA 108 (90 BASE) MCG/ACT IN AERS
INHALATION_SPRAY | RESPIRATORY_TRACT | Status: DC | PRN
  Administered 2018-12-20 (×4): 2 via RESPIRATORY_TRACT

## 2018-12-20 MED ORDER — PROPOFOL 10 MG/ML IV BOLUS
INTRAVENOUS | Status: AC
  Filled 2018-12-20: qty 20

## 2018-12-20 MED ORDER — PROPOFOL 10 MG/ML IV BOLUS
INTRAVENOUS | Status: DC | PRN
  Administered 2018-12-20: 200 mg via INTRAVENOUS

## 2018-12-20 MED ORDER — SUCCINYLCHOLINE CHLORIDE 200 MG/10ML IV SOSY
PREFILLED_SYRINGE | INTRAVENOUS | Status: DC | PRN
  Administered 2018-12-20: 20 mg via INTRAVENOUS
  Administered 2018-12-20: 100 mg via INTRAVENOUS

## 2018-12-20 MED ORDER — MEPERIDINE HCL 25 MG/ML IJ SOLN: 6.2500 mg | INTRAMUSCULAR | Status: DC | PRN

## 2018-12-20 MED ORDER — PROMETHAZINE HCL 25 MG/ML IJ SOLN: 6.2500 mg | INTRAMUSCULAR | Status: DC | PRN

## 2018-12-20 MED ORDER — GLYCOPYRROLATE 0.2 MG/ML IJ SOLN
INTRAMUSCULAR | Status: DC | PRN
  Administered 2018-12-20: 0.1 mg via INTRAVENOUS

## 2018-12-20 MED ORDER — MIDAZOLAM HCL 2 MG/2ML IJ SOLN
INTRAMUSCULAR | Status: AC
  Filled 2018-12-20: qty 2

## 2018-12-20 SURGICAL SUPPLY — 36 items
BLADE SURG 21 STRL SS (BLADE) ×3 IMPLANT
BNDG COHESIVE 6X5 TAN STRL LF (GAUZE/BANDAGES/DRESSINGS) IMPLANT
BNDG GAUZE ELAST 4 BULKY (GAUZE/BANDAGES/DRESSINGS) ×6 IMPLANT
COVER SURGICAL LIGHT HANDLE (MISCELLANEOUS) ×6 IMPLANT
COVER WAND RF STERILE (DRAPES) ×3 IMPLANT
DRAPE INCISE IOBAN 66X45 STRL (DRAPES) ×2 IMPLANT
DRAPE U-SHAPE 47X51 STRL (DRAPES) ×3 IMPLANT
DRESSING PREVENA PLUS CUSTOM (GAUZE/BANDAGES/DRESSINGS) IMPLANT
DRSG ADAPTIC 3X8 NADH LF (GAUZE/BANDAGES/DRESSINGS) ×3 IMPLANT
DRSG PREVENA PLUS CUSTOM (GAUZE/BANDAGES/DRESSINGS) ×3
DURAPREP 26ML APPLICATOR (WOUND CARE) ×3 IMPLANT
ELECT REM PT RETURN 9FT ADLT (ELECTROSURGICAL)
ELECTRODE REM PT RTRN 9FT ADLT (ELECTROSURGICAL) IMPLANT
GAUZE SPONGE 4X4 12PLY STRL (GAUZE/BANDAGES/DRESSINGS) ×3 IMPLANT
GLOVE BIOGEL PI IND STRL 9 (GLOVE) ×1 IMPLANT
GLOVE BIOGEL PI INDICATOR 9 (GLOVE) ×2
GLOVE SURG ORTHO 9.0 STRL STRW (GLOVE) ×3 IMPLANT
GOWN STRL REUS W/ TWL XL LVL3 (GOWN DISPOSABLE) ×2 IMPLANT
GOWN STRL REUS W/TWL XL LVL3 (GOWN DISPOSABLE) ×6
HANDPIECE INTERPULSE COAX TIP (DISPOSABLE)
KIT BASIN OR (CUSTOM PROCEDURE TRAY) ×3 IMPLANT
KIT DRSG PREVENA PLUS 7DAY 125 (MISCELLANEOUS) ×2 IMPLANT
KIT TURNOVER KIT B (KITS) ×3 IMPLANT
MANIFOLD NEPTUNE II (INSTRUMENTS) ×3 IMPLANT
NS IRRIG 1000ML POUR BTL (IV SOLUTION) ×3 IMPLANT
PACK ORTHO EXTREMITY (CUSTOM PROCEDURE TRAY) ×3 IMPLANT
PAD ARMBOARD 7.5X6 YLW CONV (MISCELLANEOUS) ×6 IMPLANT
SET HNDPC FAN SPRY TIP SCT (DISPOSABLE) IMPLANT
STOCKINETTE IMPERVIOUS 9X36 MD (GAUZE/BANDAGES/DRESSINGS) IMPLANT
SUT ETHILON 2 0 PSLX (SUTURE) ×4 IMPLANT
SWAB COLLECTION DEVICE MRSA (MISCELLANEOUS) ×3 IMPLANT
SWAB CULTURE ESWAB REG 1ML (MISCELLANEOUS) IMPLANT
TOWEL GREEN STERILE (TOWEL DISPOSABLE) ×3 IMPLANT
TUBE CONNECTING 12'X1/4 (SUCTIONS) ×1
TUBE CONNECTING 12X1/4 (SUCTIONS) ×2 IMPLANT
YANKAUER SUCT BULB TIP NO VENT (SUCTIONS) ×3 IMPLANT

## 2018-12-20 NOTE — Progress Notes (Signed)
Ordered post op shoe. Pt states she has crutches at home she can use.

## 2018-12-20 NOTE — H&P (Signed)
Amy Cabrera is an 23 y.o. female.   Chief Complaint: Right Foot Abscess HPI: Patient is a 23 year old woman with a 2-week history of an abscess dorsum of the right foot.  Patient states she had extensive cellulitis she was started on Levaquin and then was switched over to Septra DS twice a day.  Patient states the cultures have been negative.  Patient has a persistent draining ulcer over the dorsum of the MTP joint right fourth toe.  Patient states she has a history of diabetes in her grandmother and is a daily smoker.  She is postpartum  Past Medical History:  Diagnosis Date  . Back pain   . Medical history non-contributory   . Post partum depression    post partum    Past Surgical History:  Procedure Laterality Date  . CESAREAN SECTION N/A 08/13/2017   Procedure: CESAREAN SECTION;  Surgeon: Hildred Laser, MD;  Location: [facility] ORS;  Service: Obstetrics;  Laterality: N/A;  . WISDOM TOOTH EXTRACTION      Family History  Problem Relation Age of Onset  . Cancer Maternal Grandmother        breast CA  . Heart disease Maternal Grandfather        CAD  . Cancer Maternal Grandfather        colon CA  . Diabetes Maternal Grandfather    Social History:  reports that she has been smoking cigarettes. She has a 1.75 pack-year smoking history. She has never used smokeless tobacco. She reports current alcohol use. She reports that she does not use drugs.  Allergies: No Known Allergies  No medications prior to admission.    Results for orders placed or performed during the hospital encounter of 12/18/18 (from the past 48 hour(s))  SARS CORONAVIRUS 2 (TAT 6-24 HRS) Nasopharyngeal Nasopharyngeal Swab     Status: None   Collection Time: 12/18/18  3:58 PM   Specimen: Nasopharyngeal Swab  Result Value Ref Range   SARS Coronavirus 2 NEGATIVE NEGATIVE    Comment: (NOTE) SARS-CoV-2 target nucleic acids are NOT DETECTED. The SARS-CoV-2 RNA is generally detectable in upper and  lower respiratory specimens during the acute phase of infection. Negative results do not preclude SARS-CoV-2 infection, do not rule out co-infections with other pathogens, and should not be used as the sole basis for treatment or other patient management decisions. Negative results must be combined with clinical observations, patient history, and epidemiological information. The expected result is Negative. Fact Sheet for Patients: HairSlick.no Fact Sheet for Healthcare Providers: quierodirigir.com This test is not yet approved or cleared by the [place] FDA and  has been authorized for detection and/or diagnosis of SARS-CoV-2 by FDA under an Emergency Use Authorization (EUA). This EUA will remain  in effect (meaning this test can be used) for the duration of the COVID-19 declaration under Section 56 4(b)(1) of the Act, 21 U.S.C. section 360bbb-3(b)(1), unless the authorization is terminated or revoked sooner. Performed at [facility], [address], [place], [place] [address]    No results found.  Review of Systems  All other systems reviewed and are negative.   Last menstrual period 12/10/2018, not currently breastfeeding. Physical Exam  Patient is alert, oriented, no adenopathy, well-dressed, normal affect, normal respiratory effort. Examination patient does have swelling of the right foot I cannot palpate a pulse the Doppler was used and she has a good triphasic dorsalis pedis and posterior tibial pulse.  There is an outline drawn on her foot and ankle  which encompasses the entire dorsum of the foot and ankle.  The skin is now wrinkling the cellulitis has resolved to an area about 2 cm in diameter and there is draining infection from the dorsum of the MTP joint fourth toe.  Review of the three-view radiographs of the right foot does not show any destructive bony changes.  Patient is currently wearing slide on  sandals.  Assessment/Plan Visit Diagnoses: Right Foot Abscess.  Plan: With the persistent draining infection discussed that we will need to proceed with surgery for debridement of the abscess will obtain deep cultures.  Risks and benefits were discussed including persistent infection need for additional surgery.  Patient states she understands wished to proceed at this time plan for surgery on Wednesday overnight observation.  We will draw a hemoglobin A1c today.  Bevely Palmer [name], PA 12/20/2018, 7:00 AM

## 2018-12-20 NOTE — Op Note (Addendum)
12/20/2018  12:28 PM  PATIENT:  Amy Cabrera    PRE-OPERATIVE DIAGNOSIS:  Abscess Right Foot  POST-OPERATIVE DIAGNOSIS:  Same  PROCEDURE:  RIGHT FOOT DEBRIDEMENT With excision skin soft tissue muscle and fascia. Application of Praveena customizable wound VAC.  Local tissue rearrangement for wound closure 2 x 7 cm.  SURGEON:  Newt Minion, MD  PHYSICIAN ASSISTANT:None ANESTHESIA:   General  PREOPERATIVE INDICATIONS:  Amy Cabrera is a  23 y.o. female with a diagnosis of Abscess Right Foot who failed conservative measures and elected for surgical management.    The risks benefits and alternatives were discussed with the patient preoperatively including but not limited to the risks of infection, bleeding, nerve injury, cardiopulmonary complications, the need for revision surgery, among others, and the patient was willing to proceed.  OPERATIVE IMPLANTS: Praveena wound VAC.  @ENCIMAGES @  OPERATIVE FINDINGS: Abscess tissue sent for cultures.  OPERATIVE PROCEDURE: Patient was brought the operating room underwent a general anesthetic.  After adequate levels anesthesia were obtained patient's right lower extremity was prepped using DuraPrep draped into a sterile field a timeout was called.  AV incision was made around the ulcerative tissue on the plantar and dorsal surface of the right foot fourth webspace.  The soft tissue involved with the abscess was resected in 1 block of tissue this was sent for cultures.  The abscess undermining dorsally in this area was also further debrided.  After debridement of all involved tissue electrocautery was used for hemostasis the wound was irrigated with normal saline.  Local tissue rearrangement was used to close the wound that was 2 x 7 cm.  2-0 nylon was used for wound closure.  This was covered with a Praveena wound VAC covered with [place] and Covan.  This had a good suction fit patient was extubated taken the PACU in stable  condition.   DISCHARGE PLANNING:  Antibiotic duration: Patient will continue her antibiotics with sulfamethoxazole trimethoprim and Levaquin  Weightbearing: Touchdown weightbearing on the right  Pain medication: Prescription for Percocet  Dressing care/ Wound VAC: Continue wound VAC for 1 week  Ambulatory devices: Crutches  Discharge to: Home.  Follow-up: In the office 1 week post operative.

## 2018-12-20 NOTE — Progress Notes (Signed)
Orthopedic Tech Progress Note Patient Details:  Amy Cabrera 1995/10/19 [id number]  Ortho Devices Type of Ortho Device: Postop shoe/boot Ortho Device/Splint Location: RLE Ortho Device/Splint Interventions: Ordered, Application   Post Interventions Patient Tolerated: Well Instructions Provided: Care of device   [name] 12/20/2018, 1:28 PM

## 2018-12-20 NOTE — Anesthesia Procedure Notes (Signed)
Procedure Name: Intubation Date/Time: 12/20/2018 11:58 AM Performed by: Lance Coon, CRNA Pre-anesthesia Checklist: Patient identified, Emergency Drugs available, Suction available, Patient being monitored and Timeout performed Patient Re-evaluated:Patient Re-evaluated prior to induction Oxygen Delivery Method: Circle system utilized Preoxygenation: Pre-oxygenation with 100% oxygen Induction Type: IV induction Ventilation: Mask ventilation with difficulty Laryngoscope Size: Miller and 3 Grade View: Grade I Tube type: Oral Tube size: 7.0 mm Number of attempts: 1 Airway Equipment and Method: Stylet Placement Confirmation: ETT inserted through vocal cords under direct vision,  positive ETCO2 and breath sounds checked- equal and bilateral Secured at: 21 cm Tube secured with: Tape Dental Injury: Teeth and Oropharynx as per pre-operative assessment

## 2018-12-20 NOTE — Transfer of Care (Signed)
Immediate Anesthesia Transfer of Care Note  Patient: Amy Cabrera  Procedure(s) Performed: RIGHT FOOT DEBRIDEMENT (Right )  Patient Location: PACU  Anesthesia Type:General  Level of Consciousness: awake, alert  and oriented  Airway & Oxygen Therapy: Patient Spontanous Breathing and Patient connected to face mask oxygen  Post-op Assessment: Report given to RN and Post -op Vital signs reviewed and stable  Post vital signs: Reviewed and stable  Last Vitals:  Vitals Value Taken Time  BP 135/115 12/20/18 1229  Temp    Pulse 115 12/20/18 1233  Resp 17 12/20/18 1233  SpO2 96 % 12/20/18 1233  Vitals shown include unvalidated device data.  Last Pain:  Vitals:   12/20/18 0900  TempSrc: Oral  PainSc:          Complications: No apparent anesthesia complications

## 2018-12-20 NOTE — Anesthesia Postprocedure Evaluation (Signed)
Anesthesia Post Note  Patient: Amy Cabrera  Procedure(s) Performed: RIGHT FOOT DEBRIDEMENT (Right )     Patient location during evaluation: PACU Anesthesia Type: General Level of consciousness: awake and alert Pain management: pain level controlled Vital Signs Assessment: post-procedure vital signs reviewed and stable Respiratory status: spontaneous breathing, nonlabored ventilation and respiratory function stable Cardiovascular status: blood pressure returned to baseline and stable Postop Assessment: no apparent nausea or vomiting Anesthetic complications: no    Last Vitals:  Vitals:   12/20/18 1315 12/20/18 1330  BP: (!) 99/53 95/60  Pulse: (!) 51 69  Resp: 12 14  Temp:    SpO2: 95% 95%    Last Pain:  Vitals:   12/20/18 1330  TempSrc:   PainSc: [place]

## 2018-12-20 NOTE — Anesthesia Preprocedure Evaluation (Signed)
Anesthesia Evaluation  Patient identified by MRN, date of birth, ID band Patient awake    Reviewed: Allergy & Precautions, NPO status , Patient's Chart, lab work & pertinent test results  History of Anesthesia Complications Negative for: history of anesthetic complications  Airway Mallampati: II  TM Distance: >3 FB Neck ROM: Full    Dental no notable dental hx.    Pulmonary neg sleep apnea, neg COPD, Current Smoker and Patient abstained from smoking.,    Pulmonary exam normal breath sounds clear to auscultation       Cardiovascular (-) hypertension(-) Past MI and (-) CHF Normal cardiovascular exam(-) dysrhythmias (-) Valvular Problems/Murmurs Rhythm:Regular Rate:Normal     Neuro/Psych neg Seizures Depression    GI/Hepatic Neg liver ROS, neg GERD  ,  Endo/Other  neg diabetes  Renal/GU negative Renal ROS     Musculoskeletal   Abdominal (+) + obese,   Peds  Hematology   Anesthesia Other Findings   Reproductive/Obstetrics                             Anesthesia Physical  Anesthesia Plan  ASA: II  Anesthesia Plan: General   Post-op Pain Management:    Induction: Intravenous  PONV Risk Score and Plan: 2 and Ondansetron, Midazolam and Treatment may vary due to age or medical condition  Airway Management Planned: LMA  Additional Equipment:   Intra-op Plan:   Post-operative Plan: Extubation in OR  Informed Consent: I have reviewed the patients History and Physical, chart, labs and discussed the procedure including the risks, benefits and alternatives for the proposed anesthesia with the patient or authorized representative who has indicated his/her understanding and acceptance.       Plan Discussed with:   Anesthesia Plan Comments:         Anesthesia Quick Evaluation

## 2018-12-26 ENCOUNTER — Telehealth: Payer: Self-pay | Admitting: Family Medicine

## 2018-12-26 NOTE — Telephone Encounter (Signed)
[facility] faxed attending physician statement In dr copland's in box for review and signature

## 2018-12-27 ENCOUNTER — Encounter: Payer: Self-pay | Admitting: Physician Assistant

## 2018-12-27 ENCOUNTER — Ambulatory Visit (INDEPENDENT_AMBULATORY_CARE_PROVIDER_SITE_OTHER): Payer: Managed Care, Other (non HMO) | Admitting: Physician Assistant

## 2018-12-27 VITALS — Ht 65.0 in | Wt 224.0 lb

## 2018-12-27 DIAGNOSIS — L02611 Cutaneous abscess of right foot: Secondary | ICD-10-CM

## 2018-12-27 NOTE — Telephone Encounter (Signed)
done

## 2018-12-27 NOTE — Progress Notes (Signed)
   Post-Op Visit Note   Patient: Amy Cabrera           Date of Birth: Dec 10, 1995           MRN: [id number] Visit Date: 12/27/2018 PCP: Jinny Sanders, MD  Chief Complaint:  Chief Complaint  Patient presents with  . Right Foot - Routine Post Op    12/20/18 right foot debridement     HPI:  HPI The patient is a 23 year old woman who presents today status post right foot irrigation debridement of the wound on December 9.  She did have some maceration to the top and bottom of her foot with some blistering she has been touchdown weightbearing using crutches and a postop shoe.  Ortho Exam On examination of her right foot the incision is intact with sutures appears to be healing well there are some ruptured blisters with surrounding maceration there is no erythema no odor no active drainage incision appears to be healing well there is no gaping  Visit Diagnoses:  1. Cutaneous abscess of right foot     Plan: Discussed the importance of nonweightbearing elevation for swelling.  Begin daily Dial soap cleansing and dry dressing changes.  She will follow-up in 1 more week.  Consider suture removal at that time.  We will fill out her work paperwork today she is very concerned about losing her job  Follow-Up Instructions: Return in about 1 week (around 01/03/2019).   Imaging: No results found.  Orders:  No orders of the defined types were placed in this encounter.  No orders of the defined types were placed in this encounter.    PMFS History: Patient Active Problem List   Diagnosis Date Noted  . Cutaneous abscess of right foot   . Postpartum depression 10/12/2017  . Family history of breast cancer 03/03/2017  . Family history of type 2 diabetes mellitus 03/03/2017  . Allergic rhinitis 10/01/2014  . Low back pain 07/02/2014  . 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 Back pain   . Medical history non-contributory   . Post partum depression    post  partum    Family History  Problem Relation Age of Onset  . Cancer Maternal Grandmother        breast CA  . Heart disease Maternal Grandfather        CAD  . Cancer Maternal Grandfather        colon CA  . Diabetes Maternal Grandfather     Past Surgical History:  Procedure Laterality Date  . CESAREAN SECTION N/A 08/13/2017   Procedure: CESAREAN SECTION;  Surgeon: Rubie Maid, MD;  Location: [facility];  Service: Obstetrics;  Laterality: N/A;  . I&D EXTREMITY Right 12/20/2018   Procedure: RIGHT FOOT DEBRIDEMENT;  Surgeon: Newt Minion, MD;  Location: [facility];  Service: Orthopedics;  Laterality: Right;  . WISDOM TOOTH EXTRACTION     Social History   Occupational History  . Not on file  Tobacco Use  . Smoking status: Current Every Day Smoker    Packs/day: 0.25    Years: 7.00    Pack years: 1.75    Types: Cigarettes  . Smokeless tobacco: Never Used  Substance and Sexual Activity  . Alcohol use: Yes    Alcohol/week: 0.0 standard drinks    Comment: social  . Drug use: No  . Sexual activity: Not Currently    Birth control/protection: None

## 2018-12-30 LAB — AEROBIC/ANAEROBIC CULTURE W GRAM STAIN (SURGICAL/DEEP WOUND): Culture: NEGATIVE

## 2019-01-03 ENCOUNTER — Ambulatory Visit (INDEPENDENT_AMBULATORY_CARE_PROVIDER_SITE_OTHER): Payer: Managed Care, Other (non HMO) | Admitting: Physician Assistant

## 2019-01-03 ENCOUNTER — Other Ambulatory Visit: Payer: Self-pay

## 2019-01-03 ENCOUNTER — Encounter: Payer: Self-pay | Admitting: Physician Assistant

## 2019-01-03 VITALS — Ht 65.0 in | Wt 224.0 lb

## 2019-01-03 DIAGNOSIS — L02611 Cutaneous abscess of right foot: Secondary | ICD-10-CM

## 2019-01-03 NOTE — Telephone Encounter (Signed)
paperworkfaxed 12/16 Tried calling pt mail boxfull  Copy for pt Copy for scan

## 2019-01-03 NOTE — Progress Notes (Signed)
Office Visit Note   Patient: Amy Cabrera           Date of Birth: 1995/07/19           MRN: [id number] Visit Date: 01/03/2019              Requested by: Excell Seltzer, MD [address] [place],  [place] [address] PCP: Excell Seltzer, MD  Chief Complaint  Patient presents with  . Right Foot - Routine Post Op    12/20/18 Debridement right foot       HPI: The patient presents today 2 weeks status post right foot debridement she has been doing dry dressing changes daily and feels well she has been taking doxycycline twice a day she has been nonweightbearing with crutches  Assessment & Plan: Visit Diagnoses: No diagnosis found.  Plan: She will follow up in 2 weeks.  I am fine with her beginning to bear weight in 1 week  Follow-Up Instructions: No follow-ups on file.   Ortho Exam  Patient is alert, oriented, no adenopathy, well-dressed, normal affect, normal respiratory effort. Left foot well-healed surgical incision she does have some small area of superficial necrosis on the top of the wound but the wound is approximated.  No surrounding cellulitis no foul odor  Imaging: No results found. No images are attached to the encounter.  Labs: Lab Results  Component Value Date   REPTSTATUS 12/30/2018 FINAL 12/20/2018   GRAMSTAIN  12/20/2018    ABUNDANT WBC PRESENT,BOTH PMN AND MONONUCLEAR NO ORGANISMS SEEN Performed at [facility], [address] [place], [place] [address]    CULT RARE Mindi Junker 12/20/2018   LABORGA STAPHYLOCOCCUS SAPROPHYTICUS (A) 10/13/2016     Lab Results  Component Value Date   ALBUMIN 4.4 12/31/2016    No results found for: MG No results found for: VD25OH  No results found for: PREALBUMIN CBC EXTENDED Latest Ref Rng & Units 12/20/2018 12/13/2018 08/15/2017  WBC 4.0 - 10.5 K/uL - 11.9(H) 17.1(H)  RBC 3.87 - 5.11 MIL/uL - 4.51 3.20(L)  HGB 12.0 - 15.0 g/dL 42.3 53.6 9.0(L)  HCT 36.0 - 46.0 % - 38.9 27.0(L)  PLT 150 - 400  K/uL - 205 141(L)  NEUTROABS 1.7 - 7.7 K/uL - 9.1(H) -  LYMPHSABS 0.7 - 4.0 K/uL - 1.8 -     Body mass index is 37.28 kg/m.  Orders:  No orders of the defined types were placed in this encounter.  No orders of the defined types were placed in this encounter.    Procedures: No procedures performed  Clinical Data: No additional findings.  ROS:  All other systems negative, except as noted in the HPI. Review of Systems  Objective: Vital Signs: Ht 5\' 5"  (1.651 m)   Wt 224 lb (101.6 kg)   LMP 12/10/2018   BMI 37.28 kg/m   Specialty Comments:  No specialty comments available.  PMFS History: Patient Active Problem List   Diagnosis Date Noted  . Cutaneous abscess of right foot   . Postpartum depression 10/12/2017  . Family history of breast cancer 03/03/2017  . Family history of type 2 diabetes mellitus 03/03/2017  . Allergic rhinitis 10/01/2014  . Low back pain 07/02/2014  . 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 Back pain   . Medical history non-contributory   . Post partum depression    post partum    Family History  Problem Relation Age of  Onset  . Cancer Maternal Grandmother        breast CA  . Heart disease Maternal Grandfather        CAD  . Cancer Maternal Grandfather        colon CA  . Diabetes Maternal Grandfather     Past Surgical History:  Procedure Laterality Date  . CESAREAN SECTION N/A 08/13/2017   Procedure: CESAREAN SECTION;  Surgeon: Rubie Maid, MD;  Location: [facility];  Service: Obstetrics;  Laterality: N/A;  . I & D EXTREMITY Right 12/20/2018   Procedure: RIGHT FOOT DEBRIDEMENT;  Surgeon: Newt Minion, MD;  Location: [facility];  Service: Orthopedics;  Laterality: Right;  . WISDOM TOOTH EXTRACTION     Social History   Occupational History  . Not on file  Tobacco Use  . Smoking status: Current Every Day Smoker    Packs/day: 0.25    Years: 7.00    Pack years: 1.75    Types: Cigarettes  . Smokeless  tobacco: Never Used  Substance and Sexual Activity  . Alcohol use: Yes    Alcohol/week: 0.0 standard drinks    Comment: social  . Drug use: No  . Sexual activity: Not Currently    Birth control/protection: None

## 2019-01-09 ENCOUNTER — Encounter: Payer: Self-pay | Admitting: Orthopedic Surgery

## 2019-01-17 ENCOUNTER — Ambulatory Visit (INDEPENDENT_AMBULATORY_CARE_PROVIDER_SITE_OTHER): Payer: Managed Care, Other (non HMO) | Admitting: Physician Assistant

## 2019-01-17 ENCOUNTER — Encounter: Payer: Self-pay | Admitting: Physician Assistant

## 2019-01-17 ENCOUNTER — Other Ambulatory Visit: Payer: Self-pay

## 2019-01-17 DIAGNOSIS — L02611 Cutaneous abscess of right foot: Secondary | ICD-10-CM

## 2019-01-17 NOTE — Progress Notes (Signed)
Office Visit Note   Patient: Amy Cabrera           Date of Birth: Jun 11, 1995           MRN: [id number] Visit Date: 01/17/2019              Requested by: Excell Seltzer, MD [address] [place],  [place] [address] PCP: Excell Seltzer, MD  Chief Complaint  Patient presents with  . Right Foot - Routine Post Op, Wound Check      HPI: This is a pleasant woman who is now over 3 weeks status post right foot debridement she has finished her doxycycline over a week ago her pain is decreasing as is her swelling she still has 1 little area that is healing she is using a Band-Aid with Neosporin to cover it  Assessment & Plan: Visit Diagnoses: No diagnosis found.  Plan: She will continue to cover it with a Band-Aid she may begin softening the bottom of her foot with cocoa butter I think she can go back to work but she understands if she has any increased drainage pain or swelling she is not to work follow-up in 2 weeks  Follow-Up Instructions: No follow-ups on file.   Ortho Exam  Patient is alert, oriented, no adenopathy, well-dressed, normal affect, normal respiratory effort. Focused examination of her foot pulses are palpable.  Minimal soft tissue swelling the wound is healed except for 1 very small area that has healthy granulation tissue this measures less than a centimeter by a half a centimeter there is no foul smell or no purulent drainage no cellulitis  Imaging: No results found. No images are attached to the encounter.  Labs: Lab Results  Component Value Date   REPTSTATUS 12/30/2018 FINAL 12/20/2018   GRAMSTAIN  12/20/2018    ABUNDANT WBC PRESENT,BOTH PMN AND MONONUCLEAR NO ORGANISMS SEEN Performed at [facility], [address] [place], [place] [address]    CULT RARE Mindi Junker 12/20/2018   LABORGA STAPHYLOCOCCUS SAPROPHYTICUS (A) 10/13/2016     Lab Results  Component Value Date   ALBUMIN 4.4 12/31/2016    No results found for: MG No  results found for: VD25OH  No results found for: PREALBUMIN CBC EXTENDED Latest Ref Rng & Units 12/20/2018 12/13/2018 08/15/2017  WBC 4.0 - 10.5 K/uL - 11.9(H) 17.1(H)  RBC 3.87 - 5.11 MIL/uL - 4.51 3.20(L)  HGB 12.0 - 15.0 g/dL 44.0 10.2 9.0(L)  HCT 36.0 - 46.0 % - 38.9 27.0(L)  PLT 150 - 400 K/uL - 205 141(L)  NEUTROABS 1.7 - 7.7 K/uL - 9.1(H) -  LYMPHSABS 0.7 - 4.0 K/uL - 1.8 -     There is no height or weight on file to calculate BMI.  Orders:  No orders of the defined types were placed in this encounter.  No orders of the defined types were placed in this encounter.    Procedures: No procedures performed  Clinical Data: No additional findings.  ROS:  All other systems negative, except as noted in the HPI. Review of Systems  Objective: Vital Signs: There were no vitals taken for this visit.  Specialty Comments:  No specialty comments available.  PMFS History: Patient Active Problem List   Diagnosis Date Noted  . Cutaneous abscess of right foot   . Postpartum depression 10/12/2017  . Family history of breast cancer 03/03/2017  . Family history of type 2 diabetes mellitus 03/03/2017  . Allergic rhinitis 10/01/2014  .  Low back pain 07/02/2014  . 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 Back pain   . Medical history non-contributory   . Post partum depression    post partum    Family History  Problem Relation Age of Onset  . Cancer Maternal Grandmother        breast CA  . Heart disease Maternal Grandfather        CAD  . Cancer Maternal Grandfather        colon CA  . Diabetes Maternal Grandfather     Past Surgical History:  Procedure Laterality Date  . CESAREAN SECTION N/A 08/13/2017   Procedure: CESAREAN SECTION;  Surgeon: Rubie Maid, MD;  Location: [facility];  Service: Obstetrics;  Laterality: N/A;  . I & D EXTREMITY Right 12/20/2018   Procedure: RIGHT FOOT DEBRIDEMENT;  Surgeon: Newt Minion, MD;  Location: [facility];   Service: Orthopedics;  Laterality: Right;  . WISDOM TOOTH EXTRACTION     Social History   Occupational History  . Not on file  Tobacco Use  . Smoking status: Current Every Day Smoker    Packs/day: 0.25    Years: 7.00    Pack years: 1.75    Types: Cigarettes  . Smokeless tobacco: Never Used  Substance and Sexual Activity  . Alcohol use: Yes    Alcohol/week: 0.0 standard drinks    Comment: social  . Drug use: No  . Sexual activity: Not Currently    Birth control/protection: None

## 2019-01-31 ENCOUNTER — Encounter: Payer: Self-pay | Admitting: Physician Assistant

## 2019-01-31 ENCOUNTER — Ambulatory Visit (INDEPENDENT_AMBULATORY_CARE_PROVIDER_SITE_OTHER): Payer: Managed Care, Other (non HMO) | Admitting: Physician Assistant

## 2019-01-31 ENCOUNTER — Other Ambulatory Visit: Payer: Self-pay

## 2019-01-31 VITALS — Ht 65.0 in | Wt 224.0 lb

## 2019-01-31 DIAGNOSIS — L02611 Cutaneous abscess of right foot: Secondary | ICD-10-CM

## 2019-01-31 NOTE — Progress Notes (Signed)
Office Visit Note   Patient: Amy Cabrera           Date of Birth: 1995/04/20           MRN: [id number] Visit Date: 01/31/2019              Requested by: Jinny Sanders, MD [place],  [place] [address] PCP: Jinny Sanders, MD  Chief Complaint  Patient presents with  . Right Foot - Routine Post Op    12/20/2018 right foot deb      HPI: The patient is a 24 year old woman who is now 6 weeks status post right foot debridement over her fourth and fifth toes.  She is actually doing fairly well she still has numbness in her fourth and fifth toes.  She also notices when she is rolling through her foot she does have some pain.  She does overall feel she is getting better every day  Assessment & Plan: Visit Diagnoses: No diagnosis found.  Plan: I discussed with her some stretching of her lesser toes.  I explained the the numbness could go on for up to a year.  We also discussed sunscreen in the area for the next year when she is outside.  She will follow-up for final time in 1 month  Follow-Up Instructions: No follow-ups on file.   Ortho Exam  Patient is alert, oriented, no adenopathy, well-dressed, normal affect, normal respiratory effort. Focused exam of her right foot demonstrates no swelling.  She has a small eschar between the fourth and fifth toe there is no active drainage there is no surrounding cellulitis or fluctuance  Imaging: No results found. No images are attached to the encounter.  Labs: Lab Results  Component Value Date   REPTSTATUS 12/30/2018 FINAL 12/20/2018   GRAMSTAIN  12/20/2018    ABUNDANT WBC PRESENT,BOTH PMN AND MONONUCLEAR NO ORGANISMS SEEN Performed at [facility], [address], [place], [place] [address]    CULT RARE FINEGOLDIA MAGNA 12/20/2018   LABORGA STAPHYLOCOCCUS SAPROPHYTICUS (A) 10/13/2016     Lab Results  Component Value Date   ALBUMIN 4.4 12/31/2016    No results found for: MG No results found for:  VD25OH  No results found for: PREALBUMIN CBC EXTENDED Latest Ref Rng & Units 12/20/2018 12/13/2018 08/15/2017  WBC 4.0 - 10.5 K/uL - 11.9(H) 17.1(H)  RBC 3.87 - 5.11 MIL/uL - 4.51 3.20(L)  HGB 12.0 - 15.0 g/dL 13.7 12.3 9.0(L)  HCT 36.0 - 46.0 % - 38.9 27.0(L)  PLT 150 - 400 K/uL - 205 141(L)  NEUTROABS 1.7 - 7.7 K/uL - 9.1(H) -  LYMPHSABS 0.7 - 4.0 K/uL - 1.8 -     Body mass index is 37.28 kg/m.  Orders:  No orders of the defined types were placed in this encounter.  No orders of the defined types were placed in this encounter.    Procedures: No procedures performed  Clinical Data: No additional findings.  ROS:  All other systems negative, except as noted in the HPI. Review of Systems  Objective: Vital Signs: Ht 5\' 5"  (1.651 m)   Wt 224 lb (101.6 kg)   BMI 37.28 kg/m   Specialty Comments:  No specialty comments available.  PMFS History: Patient Active Problem List   Diagnosis Date Noted  . Cutaneous abscess of right foot   . Postpartum depression 10/12/2017  . Family history of breast cancer 03/03/2017  . Family history of type 2 diabetes mellitus  03/03/2017  . Allergic rhinitis 10/01/2014  . Low back pain 07/02/2014  . 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 Back pain   . Medical history non-contributory   . Post partum depression    post partum    Family History  Problem Relation Age of Onset  . Cancer Maternal Grandmother        breast CA  . Heart disease Maternal Grandfather        CAD  . Cancer Maternal Grandfather        colon CA  . Diabetes Maternal Grandfather     Past Surgical History:  Procedure Laterality Date  . CESAREAN SECTION N/A 08/13/2017   Procedure: CESAREAN SECTION;  Surgeon: Hildred Laser, MD;  Location: [facility];  Service: Obstetrics;  Laterality: N/A;  . I & D EXTREMITY Right 12/20/2018   Procedure: RIGHT FOOT DEBRIDEMENT;  Surgeon: Nadara Mustard, MD;  Location: [facility];  Service: Orthopedics;   Laterality: Right;  . WISDOM TOOTH EXTRACTION     Social History   Occupational History  . Not on file  Tobacco Use  . Smoking status: Current Every Day Smoker    Packs/day: 0.25    Years: 7.00    Pack years: 1.75    Types: Cigarettes  . Smokeless tobacco: Never Used  Substance and Sexual Activity  . Alcohol use: Yes    Alcohol/week: 0.0 standard drinks    Comment: social  . Drug use: No  . Sexual activity: Not Currently    Birth control/protection: None

## 2019-03-05 ENCOUNTER — Telehealth: Payer: Self-pay | Admitting: Physician Assistant

## 2019-03-05 ENCOUNTER — Other Ambulatory Visit: Payer: Self-pay

## 2019-03-05 ENCOUNTER — Encounter: Payer: Self-pay | Admitting: Physician Assistant

## 2019-03-05 ENCOUNTER — Ambulatory Visit (INDEPENDENT_AMBULATORY_CARE_PROVIDER_SITE_OTHER): Payer: Managed Care, Other (non HMO) | Admitting: Physician Assistant

## 2019-03-05 VITALS — Ht 65.0 in | Wt 224.0 lb

## 2019-03-05 DIAGNOSIS — L02611 Cutaneous abscess of right foot: Secondary | ICD-10-CM

## 2019-03-05 NOTE — Progress Notes (Signed)
Office Visit Note   Patient: EDITA WEYENBERG           Date of Birth: 11/14/1995           MRN: [id number] Visit Date: 03/05/2019              Requested by: Excell Seltzer, MD [address] [place],  [place] [address] PCP: Excell Seltzer, MD  Chief Complaint  Patient presents with  . Right Foot - Routine Post Op    12/20/18 right foot debridement       HPI: This is a pleasant 24 year old woman who is 2-1/2 months status post right foot debridement.  She has no complaints she has returned to work and is wearing regular shoewear she does still get some sensitivity around the scar but this does not seem to interfere with her day-to-day activities  Assessment & Plan: Visit Diagnoses: No diagnosis found.  Plan: We talked about doing some massage over the area of the scar with essential oils or cocoa butter.  She may follow-up as needed.  I reiterated the importance of not having sun exposure over the scar for the first year after surgery  Follow-Up Instructions: No follow-ups on file.   Ortho Exam  Patient is alert, oriented, no adenopathy, well-dressed, normal affect, normal respiratory effort. Focused examination demonstrates well-healed surgical incision.  No swelling no surrounding cellulitis mild sensitivity to deep palpation over the scar  Imaging: No results found. No images are attached to the encounter.  Labs: Lab Results  Component Value Date   REPTSTATUS 12/30/2018 FINAL 12/20/2018   GRAMSTAIN  12/20/2018    ABUNDANT WBC PRESENT,BOTH PMN AND MONONUCLEAR NO ORGANISMS SEEN Performed at [facility], [address], [place], [place] [address]    CULT RARE Mindi Junker 12/20/2018   LABORGA STAPHYLOCOCCUS SAPROPHYTICUS (A) 10/13/2016     Lab Results  Component Value Date   ALBUMIN 4.4 12/31/2016    No results found for: MG No results found for: VD25OH  No results found for: PREALBUMIN CBC EXTENDED Latest Ref Rng & Units 12/20/2018 12/13/2018  08/15/2017  WBC 4.0 - 10.5 K/uL - 11.9(H) 17.1(H)  RBC 3.87 - 5.11 MIL/uL - 4.51 3.20(L)  HGB 12.0 - 15.0 g/dL 54.2 70.6 9.0(L)  HCT 36.0 - 46.0 % - 38.9 27.0(L)  PLT 150 - 400 K/uL - 205 141(L)  NEUTROABS 1.7 - 7.7 K/uL - 9.1(H) -  LYMPHSABS 0.7 - 4.0 K/uL - 1.8 -     Body mass index is 37.28 kg/m.  Orders:  No orders of the defined types were placed in this encounter.  No orders of the defined types were placed in this encounter.    Procedures: No procedures performed  Clinical Data: No additional findings.  ROS:  All other systems negative, except as noted in the HPI. Review of Systems  Objective: Vital Signs: Ht 5\' 5"  (1.651 m)   Wt 224 lb (101.6 kg)   BMI 37.28 kg/m   Specialty Comments:  No specialty comments available.  PMFS History: Patient Active Problem List   Diagnosis Date Noted  . Cutaneous abscess of right foot   . Postpartum depression 10/12/2017  . Family history of breast cancer 03/03/2017  . Family history of type 2 diabetes mellitus 03/03/2017  . Allergic rhinitis 10/01/2014  . Low back pain 07/02/2014  . 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 Back pain   . Medical history non-contributory   .  Post partum depression    post partum    Family History  Problem Relation Age of Onset  . Cancer Maternal Grandmother        breast CA  . Heart disease Maternal Grandfather        CAD  . Cancer Maternal Grandfather        colon CA  . Diabetes Maternal Grandfather     Past Surgical History:  Procedure Laterality Date  . CESAREAN SECTION N/A 08/13/2017   Procedure: CESAREAN SECTION;  Surgeon: Rubie Maid, MD;  Location: [facility];  Service: Obstetrics;  Laterality: N/A;  . I & D EXTREMITY Right 12/20/2018   Procedure: RIGHT FOOT DEBRIDEMENT;  Surgeon: Newt Minion, MD;  Location: [place];  Service: Orthopedics;  Laterality: Right;  . WISDOM TOOTH EXTRACTION     Social History   Occupational History  . Not on  file  Tobacco Use  . Smoking status: Current Every Day Smoker    Packs/day: 0.25    Years: 7.00    Pack years: 1.75    Types: Cigarettes  . Smokeless tobacco: Never Used  Substance and Sexual Activity  . Alcohol use: Yes    Alcohol/week: 0.0 standard drinks    Comment: social  . Drug use: No  . Sexual activity: Not Currently    Birth control/protection: None

## 2019-03-05 NOTE — Telephone Encounter (Signed)
Error

## 2019-05-29 ENCOUNTER — Emergency Department
Admission: EM | Admit: 2019-05-29 | Discharge: 2019-05-29 | Disposition: A | Discharge status: Home / Self Care | Payer: Managed Care, Other (non HMO) | Attending: Student | Admitting: Student

## 2019-05-29 ENCOUNTER — Emergency Department: Payer: Managed Care, Other (non HMO)

## 2019-05-29 ENCOUNTER — Encounter: Payer: Self-pay | Admitting: Emergency Medicine

## 2019-05-29 ENCOUNTER — Other Ambulatory Visit: Payer: Self-pay

## 2019-05-29 DIAGNOSIS — R350 Frequency of micturition: Secondary | ICD-10-CM | POA: Insufficient documentation

## 2019-05-29 DIAGNOSIS — R509 Fever, unspecified: Secondary | ICD-10-CM | POA: Insufficient documentation

## 2019-05-29 DIAGNOSIS — B349 Viral infection, unspecified: Secondary | ICD-10-CM

## 2019-05-29 DIAGNOSIS — R1110 Vomiting, unspecified: Secondary | ICD-10-CM | POA: Insufficient documentation

## 2019-05-29 DIAGNOSIS — R197 Diarrhea, unspecified: Secondary | ICD-10-CM | POA: Insufficient documentation

## 2019-05-29 DIAGNOSIS — M545 Low back pain, unspecified: Secondary | ICD-10-CM

## 2019-05-29 DIAGNOSIS — F17210 Nicotine dependence, cigarettes, uncomplicated: Secondary | ICD-10-CM | POA: Diagnosis not present

## 2019-05-29 DIAGNOSIS — Z20822 Contact with and (suspected) exposure to covid-19: Secondary | ICD-10-CM | POA: Insufficient documentation

## 2019-05-29 DIAGNOSIS — R109 Unspecified abdominal pain: Secondary | ICD-10-CM | POA: Diagnosis not present

## 2019-05-29 LAB — CBC
HCT: 43.2 % (ref 36.0–46.0)
Hemoglobin: 13.9 g/dL (ref 12.0–15.0)
MCH: 26.8 pg (ref 26.0–34.0)
MCHC: 32.2 g/dL (ref 30.0–36.0)
MCV: 83.4 fL (ref 80.0–100.0)
Platelets: 159 10*3/uL (ref 150–400)
RBC: 5.18 MIL/uL — ABNORMAL HIGH (ref 3.87–5.11)
RDW: 12.9 % (ref 11.5–15.5)
WBC: 5.8 10*3/uL (ref 4.0–10.5)
nRBC: 0 % (ref 0.0–0.2)

## 2019-05-29 LAB — PREGNANCY, URINE: Preg Test, Ur: NEGATIVE

## 2019-05-29 LAB — COMPREHENSIVE METABOLIC PANEL
ALT: 16 U/L (ref 0–44)
AST: 17 U/L (ref 15–41)
Albumin: 4.5 g/dL (ref 3.5–5.0)
Alkaline Phosphatase: 95 U/L (ref 38–126)
Anion gap: 8 (ref 5–15)
BUN: 12 mg/dL (ref 6–20)
CO2: 23 mmol/L (ref 22–32)
Calcium: 9.3 mg/dL (ref 8.9–10.3)
Chloride: 106 mmol/L (ref 98–111)
Creatinine, Ser: 0.72 mg/dL (ref 0.44–1.00)
GFR calc Af Amer: >60 mL/min (ref >60)
GFR calc non Af Amer: >60 mL/min (ref >60)
Glucose, Bld: 103 mg/dL — ABNORMAL HIGH (ref 70–99)
Potassium: 4.2 mmol/L (ref 3.5–5.1)
Sodium: 137 mmol/L (ref 135–145)
Total Bilirubin: 0.5 mg/dL (ref 0.3–1.2)
Total Protein: 8.2 g/dL — ABNORMAL HIGH (ref 6.5–8.1)

## 2019-05-29 LAB — URINALYSIS, COMPLETE (UACMP) WITH MICROSCOPIC
Bacteria, UA: NONE SEEN
Bilirubin Urine: NEGATIVE
Glucose, UA: NEGATIVE mg/dL
Ketones, ur: NEGATIVE mg/dL
Leukocytes,Ua: NEGATIVE
Nitrite: NEGATIVE
Protein, ur: NEGATIVE mg/dL
Specific Gravity, Urine: 1.025 (ref 1.005–1.030)
pH: 5 (ref 5.0–8.0)

## 2019-05-29 LAB — SARS CORONAVIRUS 2 BY RT PCR (HOSPITAL ORDER, PERFORMED IN ~~LOC~~ HOSPITAL LAB): SARS Coronavirus 2: NEGATIVE

## 2019-05-29 LAB — MONONUCLEOSIS SCREEN: Mono Screen: NEGATIVE

## 2019-05-29 LAB — LIPASE, BLOOD: Lipase: 23 U/L (ref 11–51)

## 2019-05-29 MED ORDER — IBUPROFEN 600 MG PO TABS: 600.0000 mg | ORAL_TABLET | Freq: Four times a day (QID) | ORAL | 0 refills | Status: AC | PRN

## 2019-05-29 MED ORDER — ONDANSETRON HCL 4 MG PO TABS
4.0000 mg | ORAL_TABLET | Freq: Three times a day (TID) | ORAL | 0 refills | Status: AC | PRN
Start: 2019-05-29 — End: 2019-06-05

## 2019-05-29 MED ORDER — ONDANSETRON 4 MG PO TBDP
4.0000 mg | ORAL_TABLET | Freq: Once | ORAL | Status: AC
  Administered 2019-05-29: 4 mg via ORAL
  Filled 2019-05-29: qty 1

## 2019-05-29 MED ORDER — SODIUM CHLORIDE 0.9% FLUSH: 3.0000 mL | Freq: Once | INTRAVENOUS | Status: DC

## 2019-05-29 MED ORDER — ACETAMINOPHEN 500 MG PO TABS
1000.0000 mg | ORAL_TABLET | Freq: Once | ORAL | Status: AC
  Administered 2019-05-29: 1000 mg via ORAL
  Filled 2019-05-29: qty 2

## 2019-05-29 MED ORDER — KETOROLAC TROMETHAMINE 60 MG/2ML IM SOLN
30.0000 mg | Freq: Once | INTRAMUSCULAR | Status: AC
  Administered 2019-05-29: 30 mg via INTRAMUSCULAR
  Filled 2019-05-29: qty 2

## 2019-05-29 NOTE — ED Triage Notes (Signed)
Pt presents to ED via POV with c/o lower back pain, diarrhea, and emesis. Pt states hx of kidney infections, states she thinks she has another one, pt also c/o intermittent fevers at home, a-febrile upon arrival to ED.

## 2019-05-29 NOTE — ED Provider Notes (Signed)
[facility] Emergency Department Provider Note  ____________________________________________   First MD Initiated Contact with Patient 05/29/19 1313     (approximate)  I have reviewed the triage vital signs and the nursing notes.  History  Chief Complaint Back Pain and Fever    HPI Amy Cabrera is a 24 y.o. female past medical history as below, who presents to the emergency department for headache, back pain, fever, urinary frequency, vomiting, and diarrhea.  Symptoms have been present since Thursday, constant.  She denies any dysuria, but is concerned for potential pyelonephritis as she has had this previously with similar symptoms.  She is afebrile on arrival to the ED, but reports T-max at home around 102.  She denies any cough or trouble breathing.  Denies any sick contacts.  Has not been vaccinated against COVID.  She denies any vaginal discharge, pelvic pain, or concern for STD infection.  No history of kidney stones.  Has not eaten anything new or different.   Past Medical Hx Past Medical History:  Diagnosis Date  . Back pain   . Medical history non-contributory   . Post partum depression    post partum    Problem List Patient Active Problem List   Diagnosis Date Noted  . Cutaneous abscess of right foot   . Postpartum depression 10/12/2017  . Family history of breast cancer 03/03/2017  . Family history of type 2 diabetes mellitus 03/03/2017  . Allergic rhinitis 10/01/2014  . Low back pain 07/02/2014  . 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 CESAREAN SECTION N/A 08/13/2017   Procedure: CESAREAN SECTION;  Surgeon: Rubie Maid, MD;  Location: [facility];  Service: Obstetrics;  Laterality: N/A;  . I & D EXTREMITY Right 12/20/2018   Procedure: RIGHT FOOT DEBRIDEMENT;  Surgeon: Newt Minion, MD;  Location: [place];  Service: Orthopedics;  Laterality: Right;  . WISDOM TOOTH EXTRACTION       Medications Prior to Admission medications   Medication Sig Start Date End Date Taking? Authorizing Provider  ibuprofen (ADVIL) 200 MG tablet Take 800 mg by mouth every 6 (six) hours as needed for headache or moderate pain.    [provider]  loratadine (CLARITIN) 10 MG tablet Take 10 mg by mouth daily as needed for allergies.    [provider]  oxyCODONE-acetaminophen (PERCOCET) 5-325 MG tablet Take 1 tablet by mouth every 4 (four) hours as needed for severe pain. 12/20/18 12/20/19  Persons, Bevely Palmer, PA    Allergies Patient has no known allergies.  Family Hx Family History  Problem Relation Age of Onset  . Cancer Maternal Grandmother        breast CA  . Heart disease Maternal Grandfather        CAD  . Cancer Maternal Grandfather        colon CA  . Diabetes Maternal Grandfather     Social Hx Social History   Tobacco Use  . Smoking status: Current Every Day Smoker    Packs/day: 0.25    Years: 7.00    Pack years: 1.75    Types: Cigarettes  . Smokeless tobacco: Never Used  Substance Use Topics  . Alcohol use: Yes    Alcohol/week: 0.0 standard drinks    Comment: social  . Drug use: No     Review of Systems  Constitutional: + fever Eyes: Negative for visual changes. ENT: Negative for sore throat. Cardiovascular: Negative  for chest pain. Respiratory: Negative for shortness of breath. Gastrointestinal: + vomiting, diarrhea Genitourinary: + urinary frequency. Negative for dysuria.  Musculoskeletal: Negative for leg swelling. + low back pain Skin: Negative for rash. Neurological: + for headaches.   Physical Exam  Vital Signs: ED Triage Vitals  Enc Vitals Group     BP 05/29/19 1202 113/66     Pulse Rate 05/29/19 1202 (!) 108     Resp 05/29/19 1202 20     Temp 05/29/19 1222 98.9 F (37.2 C)     Temp Source 05/29/19 1222 Oral     SpO2 05/29/19 1202 100 %     Weight 05/29/19 1202 230 lb (104.3 kg)     Height 05/29/19 1202 5\' 5"  (1.651  m)     Head Circumference --      Peak Flow --      Pain Score 05/29/19 1222 5     Pain Loc --      Pain Edu? --      Excl. in GC? --     Constitutional: Alert and oriented. Well appearing. NAD.  Head: Normocephalic. Atraumatic. Eyes: Conjunctivae clear. Sclera anicteric. Pupils equal and symmetric. Nose: No masses or lesions. No congestion or rhinorrhea. Mouth/Throat: Wearing mask.  Neck: No stridor. Trachea midline.  Cardiovascular: Normal rate, regular rhythm. Extremities well perfused. Respiratory: Normal respiratory effort.  Lungs CTAB. Gastrointestinal: Soft. Non-distended. Non-tender. No CVA tenderness. Genitourinary: Deferred. Musculoskeletal: No lower extremity edema. No deformities. Back: No midline L spine tenderness. No paraspinal muscular tenderness.  Neurologic:  Normal speech and language. No gross focal or lateralizing neurologic deficits are appreciated.  Skin: Skin is warm, dry and intact. No rash noted. Psychiatric: Mood and affect are appropriate for situation.    Radiology  Personally reviewed available imaging myself.   CXR - IMPRESSION:  Lungs clear. Cardiac silhouette within normal limits.   CT Renal - IMPRESSION:  1. No renal stones or obstructive uropathy. No acute findings in the  abdomen/pelvis.  2. Increased number of small central mesenteric and retroperitoneal  nodes, unchanged from 2018 exam and likely reactive.  3. Mild splenomegaly, unchanged.  4. Possible gallbladder sludge. No gallbladder inflammation by CT.    Procedures  Procedure(s) performed (including critical care):  Procedures   Initial Impression / Assessment and Plan / MDM / ED Course  24 y.o. female who presents to the ED for fever, vomiting, diarrhea, urinary frequency, low back pain, headache  Ddx: viral syndrome, COVID, UTI, pyelonephritis, kidney stone, gastroenteritis. No STD exposure or pelvic pain or vaginal discharge to suggest pelvic infection.  Will plan  for labs, urine studies, imaging  UA negative for infection.  Labs otherwise without actionable derangements.  CT without any evidence of renal stones or obstructive uropathy, no acute findings in the abdomen/pelvis.  Unchanged, likely reactive nodes - perhaps element of gastroenteritis contributing to her presentation today.  Mild splenomegaly, unchanged.  Possible gallbladder sludge, but no gallbladder inflammation on imaging, and no RUQ tenderness on exam, no white count or elevated bilirubin or LFTs suggestive of acute cholecystitis.  Will add on mono screening in the setting of the splenomegaly and her symptoms, however, per CT read this appears unchanged from prior.  Given otherwise negative work-up, feel patient is stable for discharge with supportive care and outpatient follow-up.  She is aware she will be updated if her mono screen or COVID testing is positive.  Patient voices understanding and is in agreement with the plan. Given return precautions.  _______________________________   As part of my medical decision making I have reviewed available labs, radiology tests, reviewed old records/performed chart review.     Final Clinical Impression(s) / ED Diagnosis  Final diagnoses:  Nonspecific low back pain  Vomiting and diarrhea  Urinary frequency       Note:  This document was prepared using Dragon voice recognition software and may include unintentional dictation errors.   Miguel Aschoff., MD 05/29/19 1556

## 2019-05-29 NOTE — ED Notes (Signed)
AAOx3.  Skin warm and dry.  NAD 

## 2019-05-29 NOTE — Discharge Instructions (Addendum)
Thank you for letting us take care of you in the emergency department today. We will call you w/ the results for your COVID swab and your mono test.  Please continue to take any regular, prescribed medications.   New medications we have prescribed:  Zofran, for nausea Ibuprofen, for fever/headache/pain  Please follow up with: Your primary care doctor to review your ER visit and follow up on your symptoms.    Please return to the ER for any new or worsening symptoms.

## 2019-05-30 LAB — URINE CULTURE: Culture: NO GROWTH

## 2020-03-03 ENCOUNTER — Emergency Department: Payer: Managed Care, Other (non HMO)

## 2020-03-03 ENCOUNTER — Emergency Department
Admission: EM | Admit: 2020-03-03 | Discharge: 2020-03-03 | Disposition: A | Discharge status: Home / Self Care | Payer: Managed Care, Other (non HMO) | Attending: Student in an Organized Health Care Education/Training Program | Admitting: Student in an Organized Health Care Education/Training Program

## 2020-03-03 ENCOUNTER — Other Ambulatory Visit: Payer: Self-pay

## 2020-03-03 DIAGNOSIS — R0789 Other chest pain: Secondary | ICD-10-CM | POA: Diagnosis not present

## 2020-03-03 DIAGNOSIS — R072 Precordial pain: Secondary | ICD-10-CM | POA: Diagnosis not present

## 2020-03-03 DIAGNOSIS — R079 Chest pain, unspecified: Secondary | ICD-10-CM

## 2020-03-03 DIAGNOSIS — Z0389 Encounter for observation for other suspected diseases and conditions ruled out: Secondary | ICD-10-CM | POA: Diagnosis not present

## 2020-03-03 DIAGNOSIS — R200 Anesthesia of skin: Secondary | ICD-10-CM | POA: Diagnosis not present

## 2020-03-03 DIAGNOSIS — R0602 Shortness of breath: Secondary | ICD-10-CM | POA: Diagnosis not present

## 2020-03-03 DIAGNOSIS — F17210 Nicotine dependence, cigarettes, uncomplicated: Secondary | ICD-10-CM | POA: Insufficient documentation

## 2020-03-03 LAB — BASIC METABOLIC PANEL
Anion gap: 5 (ref 5–15)
BUN: 13 mg/dL (ref 6–20)
CO2: 26 mmol/L (ref 22–32)
Calcium: 9.3 mg/dL (ref 8.9–10.3)
Chloride: 107 mmol/L (ref 98–111)
Creatinine, Ser: 0.94 mg/dL (ref 0.44–1.00)
GFR, Estimated: >60 mL/min (ref >60)
Glucose, Bld: 100 mg/dL — ABNORMAL HIGH (ref 70–99)
Potassium: 3.7 mmol/L (ref 3.5–5.1)
Sodium: 138 mmol/L (ref 135–145)

## 2020-03-03 LAB — CBC
HCT: 38.3 % (ref 36.0–46.0)
Hemoglobin: 12.5 g/dL (ref 12.0–15.0)
MCH: 27.1 pg (ref 26.0–34.0)
MCHC: 32.6 g/dL (ref 30.0–36.0)
MCV: 83.1 fL (ref 80.0–100.0)
Platelets: 196 10*3/uL (ref 150–400)
RBC: 4.61 MIL/uL (ref 3.87–5.11)
RDW: 12.7 % (ref 11.5–15.5)
WBC: 10 10*3/uL (ref 4.0–10.5)
nRBC: 0 % (ref 0.0–0.2)

## 2020-03-03 LAB — TROPONIN I (HIGH SENSITIVITY)
Troponin I (High Sensitivity): <2 ng/L (ref <18)
Troponin I (High Sensitivity): <2 ng/L (ref <18)

## 2020-03-03 LAB — D-DIMER, QUANTITATIVE: D-Dimer, Quant: 0.76 ug/mL-FEU — ABNORMAL HIGH (ref 0.00–0.50)

## 2020-03-03 LAB — POC URINE PREG, ED: Preg Test, Ur: NEGATIVE

## 2020-03-03 MED ORDER — IOHEXOL 350 MG/ML SOLN
75.0000 mL | Freq: Once | INTRAVENOUS | Status: AC | PRN
  Administered 2020-03-03: 75 mL via INTRAVENOUS
  Filled 2020-03-03: qty 75

## 2020-03-03 MED ORDER — IPRATROPIUM-ALBUTEROL 0.5-2.5 (3) MG/3ML IN SOLN
3.0000 mL | Freq: Once | RESPIRATORY_TRACT | Status: AC
  Administered 2020-03-03: 3 mL via RESPIRATORY_TRACT
  Filled 2020-03-03: qty 3

## 2020-03-03 NOTE — ED Provider Notes (Signed)
[facility] Emergency Department Provider Note    Event Date/Time   First MD Initiated Contact with Patient 03/03/20 1638     (approximate)  I have reviewed the triage vital signs and the nursing notes.   HISTORY  Chief Complaint Chest Pain    HPI Amy Cabrera Amy Cabrera is a 25 y.o. female below listed past medical history presents to the ER for evaluation of midsternal left-sided chest pain that awoke her from sleep does have discomfort when taking deep inspiration.  She denies any numbness or tingling.  Denies any pain right now.  States she does feel some discomfort when she is about to take a deep breath and so that is why she is not breathing deeply.   She denies any lower extremity swelling.  She does take oral contraceptive pills.  Denies any history of DVT.   Past Medical History:  Diagnosis Date  . Back pain   . Medical history non-contributory   . Post partum depression    post partum   Family History  Problem Relation Age of Onset  . Cancer Maternal Grandmother        breast CA  . Heart disease Maternal Grandfather        CAD  . Cancer Maternal Grandfather        colon CA  . Diabetes Maternal Grandfather    Past Surgical History:  Procedure Laterality Date  . CESAREAN SECTION N/A 08/13/2017   Procedure: CESAREAN SECTION;  Surgeon: Hildred Laser, MD;  Location: [facility];  Service: Obstetrics;  Laterality: N/A;  . I & D EXTREMITY Right 12/20/2018   Procedure: RIGHT FOOT DEBRIDEMENT;  Surgeon: Nadara Mustard, MD;  Location: [facility] OR;  Service: Orthopedics;  Laterality: Right;  . WISDOM TOOTH EXTRACTION     Patient Active Problem List   Diagnosis Date Noted  . Cutaneous abscess of right foot   . Postpartum depression 10/12/2017  . Family history of breast cancer 03/03/2017  . Family history of type 2 diabetes mellitus 03/03/2017  . Allergic rhinitis 10/01/2014  . Low back pain 07/02/2014  . 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 Authorizing Provider  norethindrone (MICRONOR) 0.35 MG tablet Take 1 tablet by mouth daily.    [provider]    Allergies Patient has no known allergies.    Social History Social History   Tobacco Use  . Smoking status: Current Every Day Smoker    Packs/day: 0.25    Years: 7.00    Pack years: 1.75    Types: Cigarettes  . Smokeless tobacco: Never Used  Vaping Use  . Vaping Use: Former  . Devices: just tried it   Substance Use Topics  . Alcohol use: Yes    Alcohol/week: 0.0 standard drinks    Comment: social  . Drug use: No    Review of Systems Patient denies headaches, rhinorrhea, blurry vision, numbness, shortness of breath, chest pain, edema, cough, abdominal pain, nausea, vomiting, diarrhea, dysuria, fevers, rashes or hallucinations unless otherwise stated above in HPI. ____________________________________________   PHYSICAL EXAM:  VITAL SIGNS: Vitals:   03/03/20 1428 03/03/20 1751  BP: 130/83 128/73  Pulse: 85 80  Resp: 18 18  Temp: 98.3 F (36.8 C)   SpO2: 100% 97%    Constitutional: Alert and oriented.  Eyes: Conjunctivae are normal.  Head: Atraumatic. Nose: No congestion/rhinnorhea. Mouth/Throat: Mucous membranes are moist.  Neck: No stridor. Painless ROM.  Cardiovascular: Normal rate, regular rhythm. Grossly normal heart sounds.  Good peripheral circulation. Respiratory: Normal respiratory effort.  No retractions. Lungs CTAB. Gastrointestinal: Soft and nontender. No distention. No abdominal bruits. No CVA tenderness. Genitourinary:  Musculoskeletal: No lower extremity tenderness nor edema.  No joint effusions. Neurologic:  Normal speech and language. No gross focal neurologic deficits are appreciated. No facial droop Skin:  Skin is warm, dry and intact. No rash noted. Psychiatric: Mood and affect are normal. Speech and behavior are  normal.  ____________________________________________   LABS (all labs ordered are listed, but only abnormal results are displayed)  Results for orders placed or performed during the hospital encounter of 03/03/20 (from the past 24 hour(s))  Basic metabolic panel     Status: Abnormal   Collection Time: 03/03/20  2:55 PM  Result Value Ref Range   Sodium 138 135 - 145 mmol/L   Potassium 3.7 3.5 - 5.1 mmol/L   Chloride 107 98 - 111 mmol/L   CO2 26 22 - 32 mmol/L   Glucose, Bld 100 (H) 70 - 99 mg/dL   BUN 13 6 - 20 mg/dL   Creatinine, Ser 0.25 0.44 - 1.00 mg/dL   Calcium 9.3 8.9 - 85.2 mg/dL   GFR, Estimated >77 >82 mL/min   Anion gap 5 5 - 15  CBC     Status: None   Collection Time: 03/03/20  2:55 PM  Result Value Ref Range   WBC 10.0 4.0 - 10.5 K/uL   RBC 4.61 3.87 - 5.11 MIL/uL   Hemoglobin 12.5 12.0 - 15.0 g/dL   HCT 42.3 53.6 - 14.4 %   MCV 83.1 80.0 - 100.0 fL   MCH 27.1 26.0 - 34.0 pg   MCHC 32.6 30.0 - 36.0 g/dL   RDW 31.5 40.0 - 86.7 %   Platelets 196 150 - 400 K/uL   nRBC 0.0 0.0 - 0.2 %  Troponin I (High Sensitivity)     Status: None   Collection Time: 03/03/20  2:55 PM  Result Value Ref Range   Troponin I (High Sensitivity) <2 <18 ng/L  Troponin I (High Sensitivity)     Status: None   Collection Time: 03/03/20  4:57 PM  Result Value Ref Range   Troponin I (High Sensitivity) <2 <18 ng/L  D-dimer, quantitative     Status: Abnormal   Collection Time: 03/03/20  4:57 PM  Result Value Ref Range   D-Dimer, Quant 0.76 (H) 0.00 - 0.50 ug/mL-FEU  POC urine preg, ED     Status: None   Collection Time: 03/03/20  5:44 PM  Result Value Ref Range   Preg Test, Ur Negative Negative   ____________________________________________  EKG My review and personal interpretation at Time: 14:22   Indication: chest pain  Rate: 90  Rhythm: sinus Axis: normal Other: normal intervals, no stemi ____________________________________________  RADIOLOGY  I personally reviewed all  radiographic images ordered to evaluate for the above acute complaints and reviewed radiology reports and findings.  These findings were personally discussed with the patient.  Please see medical record for radiology report.  ____________________________________________   PROCEDURES  Procedure(s) performed:  Procedures    Critical Care performed: no ____________________________________________   INITIAL IMPRESSION / ASSESSMENT AND PLAN / ED COURSE  Pertinent labs & imaging results that were available during my care of the patient were reviewed by me and considered in my medical decision making (see chart for details).   DDX: ACS, pericarditis, esophagitis, boerhaaves,  pe, dissection, pna, bronchitis, costochondritis   Kollyns N Hardaway is a 25 y.o. who presents to the ED with symptoms as described above.  Patient nontoxic-appearing.  Given her recent Covid illness D-dimer was sent.  Her troponin was negative.  EKG nonischemic.  Blood work otherwise reassuring and chest x-ray unremarkable.  D-dimer was elevated therefore CTA was ordered evaluate for PE.  No sign of PE or acute intrathoracic process.  Is not having pain at this time.  Not consistent with dissection.  Not consistent with infectious process.  Patient does admit to feel like she is dealing with some increasing stress as well as some social anxiety and was requesting referral for therapy.  Does appear clinically appropriate for outpatient follow-up.     The patient was evaluated in Emergency Department today for the symptoms described in the history of present illness. He/she was evaluated in the context of the global COVID-19 pandemic, which necessitated consideration that the patient might be at risk for infection with the SARS-CoV-2 virus that causes COVID-19. Institutional protocols and algorithms that pertain to the evaluation of patients at risk for COVID-19 are in a state of rapid change based on information released by  regulatory bodies including the CDC and federal and state organizations. These policies and algorithms were followed during the patient's care in the ED.  As part of my medical decision making, I reviewed the following data within the electronic MEDICAL RECORD NUMBER Nursing notes reviewed and incorporated, Labs reviewed, notes from prior ED visits and [place] Controlled Substance Database   ____________________________________________   FINAL CLINICAL IMPRESSION(S) / ED DIAGNOSES  Final diagnoses:  Nonspecific chest pain      NEW MEDICATIONS STARTED DURING THIS VISIT:  New Prescriptions   No medications on file     Note:  This document was prepared using Dragon voice recognition software and may include unintentional dictation errors.    Willy Eddy, MD 03/03/20 [phone]

## 2020-03-03 NOTE — ED Notes (Signed)
Patient is resting comfortably. Patient updated on pending test and POC.

## 2020-03-03 NOTE — ED Notes (Signed)
Pt reports chest pain since this morning. Intermittent  Sob.  cig smoker.  States dx with covid 1 month ago.  Pt alert  Speech clear.

## 2020-03-03 NOTE — ED Triage Notes (Signed)
Pt to ed with c/o chest pain left side that was there when she awoke today. Pt does admit to vaping. Pt reports SOB but denies n/v, denies diaphoresis, denies radiation of pain. Pt states she does not have PCP. Appears in NAD at triage.

## 2020-03-03 NOTE — ED Notes (Signed)
Patient transported to CT 

## 2020-03-04 ENCOUNTER — Telehealth: Payer: Self-pay

## 2020-03-04 NOTE — Telephone Encounter (Signed)
Transition Care Management Unsuccessful Follow-up Telephone Call  Date of discharge and from where:  03/03/2020 from [facility]  Attempts:  1st Attempt  Reason for unsuccessful TCM follow-up call:  Left voice message

## 2020-03-05 NOTE — Telephone Encounter (Signed)
Transition Care Management Follow-up Telephone Call  Date of discharge and from where: 03/03/2020 from   How have you been since you were released from the hospital? Pt stated that she is feeling much better today and has no questions or concerns.   Any questions or concerns? No  Items Reviewed:  Did the pt receive and understand the discharge instructions provided? Yes   Medications obtained and verified? Yes   Other? No   Any new allergies since your discharge? No   Dietary orders reviewed? n/a  Do you have support at home? Yes   Functional Questionnaire: (I = Independent and D = Dependent) ADLs: I  Bathing/Dressing- I  Meal Prep- I  Eating- I  Maintaining continence- I  Transferring/Ambulation- I  Managing Meds- I  Follow up appointments reviewed:   PCP Hospital f/u appt confirmed? No  Pt encouraged to call pcp for a hospital follow up.   Specialist Hospital f/u appt confirmed? No    Are transportation arrangements needed? No   If their condition worsens, is the pt aware to call PCP or go to the Emergency Dept.? Yes  Was the patient provided with contact information for the PCP's office or ED? Yes  Was to pt encouraged to call back with questions or concerns? Yes

## 2020-07-21 ENCOUNTER — Other Ambulatory Visit: Payer: Self-pay

## 2020-07-21 ENCOUNTER — Ambulatory Visit (HOSPITAL_COMMUNITY)
Admission: EM | Admit: 2020-07-21 | Discharge: 2020-07-21 | Disposition: A | Discharge status: Home / Self Care | Payer: 59 | Attending: Psychiatry | Admitting: Psychiatry

## 2020-07-21 DIAGNOSIS — F331 Major depressive disorder, recurrent, moderate: Secondary | ICD-10-CM | POA: Diagnosis not present

## 2020-07-21 MED ORDER — HYDROXYZINE PAMOATE 25 MG PO CAPS: 25.0000 mg | ORAL_CAPSULE | Freq: Three times a day (TID) | ORAL | 0 refills | Status: AC | PRN

## 2020-07-21 MED ORDER — SERTRALINE HCL 50 MG PO TABS: 50.0000 mg | ORAL_TABLET | Freq: Every day | ORAL | 0 refills | Status: DC

## 2020-07-21 NOTE — Discharge Instructions (Addendum)

## 2020-07-21 NOTE — ED Provider Notes (Addendum)
[facility] Medical Screening Exam  Patient Name: Amy Cabrera MRN: [id number] Date of Evaluation: 07/21/20 Chief Complaint:   Diagnosis:  Final diagnoses:  MDD (major depressive disorder), recurrent episode, moderate (HCC)    History of Present illness: Amy Cabrera is a 25 y.o. female. patient presented to [facility] as a walk in alone with complaints of "my depression has gotten worse".  [name], 25 y.o., female patient seen face to face by this provider, consulted with Dr. Bronwen Betters; and chart reviewed on 07/21/20.    During evaluation Chenel N Dedominicis is in sitting position in no acute distress.  She makes good eye contact.  She is tearful during the assessment.  She is alert, oriented x 4 and cooperative.  States that she is anxious a lot at the time.  Her  mood is depressed with congruent affect.  States she sleeps 6 to 8 hours per night.  States she does have problems falling asleep.  Reports no change in appetite.  She does not appear to be responding to internal/external stimuli or delusional thoughts.  Patient denies suicidal/self-harm/homicidal ideation, psychosis, and paranoia.  Patient contracted for safety.  States, "I have never had suicidal thoughts, I do not want to hurt myself".  Denies any access to firearms/weapons.  Patient answered question appropriately.    Patient states she has been dealing with depression since her baby was born 3 years ago. States the past month she has notices that her depression has worsened. States she she became concerned yesterday when her baby was crying and she felt agitated.  States she does not want to be angry and depressed in front of her child.  Patient denies having any thoughts of harming her child or anyone else.  Patient is requesting medications for depression.  States 3 years ago after her baby was born she was prescribed Zoloft.  Reports it was effective in helping with her depression, but she stopped  taking because she could not afford medications at that time.  Patient does not have any outpatient psychiatric services in place.  Patient denies alcohol or illegal substance use.  Patient lives with her mother and father.  States they are helpful with helping her with her child.  States that she works for [employer].  Prescription for Zoloft 50 mg p.o. daily and hydroxyzine 25 mg p.o. 3 times daily as needed sent to patient's pharmacy, [facility] in [place].  Provided education on Zoloft and hydroxyzine, including side effects.  Discussed with patient that no refills will be given at this location, she will have to follow-up with an outpatient provider.  Provided outpatient psychiatric resources.  Discussed IOP/PHP, but patient states her working hours would not allow her to participate in an intensive program.  Patient agrees to follow-up with an outpatient provider.   Psychiatric Specialty Exam  Presentation  General Appearance:Casual  Eye Contact:Good  Speech:Clear and Coherent; Normal Rate  Speech Volume:Normal  Handedness:Right   Mood and Affect  Mood:Anxious; Depressed  Affect:Tearful; Depressed; Congruent   Thought Process  Thought Processes:Coherent  Descriptions of Associations:Intact  Orientation:Full (Time, Place and Person)  Thought Content:Logical    Hallucinations:None  Ideas of Reference:None  Suicidal Thoughts:No  Homicidal Thoughts:No   Sensorium  Memory:Immediate Good; Recent Good; Remote Good  Judgment:Good  Insight:Good   Executive Functions  Concentration:Good  Attention Span:Good  Recall:Good  Fund of Knowledge:Good  Language:Good   Psychomotor Activity  Psychomotor Activity:Normal   Assets  Assets:Communication Skills; Desire  for Improvement; Financial Resources/Insurance; Housing; Physical Health; Resilience; Social Support; Vocational/Educational; Transportation   Sleep  Sleep:Fair  Number of hours:  6   No data recorded  Physical Exam: Physical Exam Vitals and nursing note reviewed.  Constitutional:      General: She is not in acute distress.    Appearance: Normal appearance. She is not ill-appearing.  HENT:     Head: Normocephalic.  Eyes:     Conjunctiva/sclera: Conjunctivae normal.  Cardiovascular:     Rate and Rhythm: Normal rate.  Pulmonary:     Effort: Pulmonary effort is normal. No respiratory distress.  Musculoskeletal:        General: Normal range of motion.     Cervical back: Normal range of motion.  Skin:    General: Skin is dry.  Neurological:     Mental Status: She is alert and oriented to person, place, and time.  Psychiatric:        Attention and Perception: Attention and perception normal.        Mood and Affect: Mood is anxious and depressed. Affect is tearful.        Speech: Speech normal.        Behavior: Behavior normal. Behavior is cooperative.        Thought Content: Thought content normal.        Cognition and Memory: Cognition normal.        Judgment: Judgment normal.   Review of Systems  Constitutional: Negative.   HENT: Negative.    Eyes: Negative.   Respiratory: Negative.    Cardiovascular: Negative.   Musculoskeletal: Negative.   Skin: Negative.   Neurological: Negative.   Psychiatric/Behavioral:  Positive for depression. The patient is nervous/anxious.   Blood pressure 129/76, pulse 78, temperature 98.5 F (36.9 C), temperature source Oral, resp. rate 16, SpO2 99 %. There is no height or weight on file to calculate BMI.  Musculoskeletal: Strength & Muscle Tone: within normal limits Gait & Station: normal Patient leans: N/A   [facility] MSE Discharge Disposition for Follow up and Recommendations: Based on my evaluation the patient does not appear to have an emergency medical condition and can be discharged with resources and follow up care in outpatient services for Medication Management and Individual Therapy  Outpatient psychiatric  resources provided on AVS, and discussed with patient.  Prescription for Zoloft 50 mg p.o. QD and hydroxyzine 25 mg p.o. TID PRN sent to patient's pharmacy [facility] in [place].  Provided education on Zoloft and hydroxyzine, including side effects  No evidence of imminent risk to self or others at present.    Patient does not meet criteria for psychiatric inpatient admission. Discussed crisis plan, support from social network, calling 911, coming to the Emergency Department, and calling Suicide Hotline.      Ardis Hughs, NP 07/21/2020, 4:57 PM

## 2020-07-21 NOTE — Discharge Summary (Signed)
Amy Cabrera to be D/C'd Home per NP order. Discussed with the patient and all questions fully answered. An After Visit Summary was printed and given to the patient.  Patient escorted out and D/C home via private auto.  Dickie La  07/21/2020 4:39 PM

## 2020-09-29 DIAGNOSIS — F331 Major depressive disorder, recurrent, moderate: Secondary | ICD-10-CM | POA: Diagnosis not present

## 2020-09-29 DIAGNOSIS — F411 Generalized anxiety disorder: Secondary | ICD-10-CM | POA: Diagnosis not present

## 2020-10-21 ENCOUNTER — Other Ambulatory Visit: Payer: Self-pay

## 2020-10-21 ENCOUNTER — Encounter: Payer: Self-pay | Admitting: Family Medicine

## 2020-10-21 ENCOUNTER — Telehealth (INDEPENDENT_AMBULATORY_CARE_PROVIDER_SITE_OTHER): Payer: Managed Care, Other (non HMO) | Admitting: Family Medicine

## 2020-10-21 VITALS — Ht 65.0 in | Wt 245.0 lb

## 2020-10-21 DIAGNOSIS — J0100 Acute maxillary sinusitis, unspecified: Secondary | ICD-10-CM

## 2020-10-21 MED ORDER — AMOXICILLIN 500 MG PO CAPS: 1000.0000 mg | ORAL_CAPSULE | Freq: Two times a day (BID) | ORAL | 0 refills | Status: DC

## 2020-10-21 NOTE — Assessment & Plan Note (Signed)
Make sure home COVID test negative.  Given > 2 weeks of symptoms.. treat with oral antibiotics, mucolytic, saline irrigation.  Meds ordered this encounter  Medications  . amoxicillin (AMOXIL) 500 MG capsule    Sig: Take 2 capsules (1,000 mg total) by mouth 2 (two) times daily.    Dispense:  40 capsule    Refill:  0

## 2020-10-21 NOTE — Patient Instructions (Signed)
Have home COVID test.. let us know if it is positive.  Continue mucinex. Add nasal saline irrigation. Complete antibiotics for possible bacterial sinus infection.  Call if not improving as expected.

## 2020-10-21 NOTE — Progress Notes (Signed)
VIRTUAL VISIT Due to national recommendations of social distancing due to COVID 19, a virtual visit is felt to be most appropriate for this patient at this time.   I connected with the patient on 10/21/20 at  9:00 AM EDT by virtual telehealth platform and verified that I am speaking with the correct person using two identifiers.   I discussed the limitations, risks, security and privacy concerns of performing an evaluation and management service by  virtual telehealth platform and the availability of in person appointments. I also discussed with the patient that there may be a patient responsible charge related to this service. The patient expressed understanding and agreed to proceed.  Patient location: Home Provider Location: [facility] Participants: Kerby Nora and [name]   Chief Complaint  Patient presents with   Cough   Sore Throat   Headache   Nasal Congestion    History of Present Illness:  25 year old female obese patient with history of tobacco abuse new onset cough, ST, headache and nasal congestion.   She reports 2 week ago she started having sinus congestion, sinus pressure, headache.  Post nasal drip is causing sore throat. White plaque in back of throat.  No fever.  No myalgia.  NO SOB, no wheeze.  No ear pain.   She has tried mucinex all in one... helped minimally.   No sick exposure.  COVID 19 screen COVID testing: none COVID vaccine:none COVID exposure: No recent travel or known exposure to COVID19  The importance of social distancing was discussed today.     Review of Systems  Constitutional:  Positive for malaise/fatigue. Negative for chills and fever.  HENT:  Positive for congestion, sinus pain and sore throat. Negative for ear pain.   Eyes:  Negative for pain and redness.  Respiratory:  Positive for cough. Negative for shortness of breath.   Cardiovascular:  Negative for chest pain, palpitations and leg swelling.   Gastrointestinal:  Negative for abdominal pain, blood in stool, constipation, diarrhea, nausea and vomiting.  Genitourinary:  Negative for dysuria.  Musculoskeletal:  Negative for falls and myalgias.  Skin:  Negative for rash.  Neurological:  Negative for dizziness.  Psychiatric/Behavioral:  Negative for depression. The patient is not nervous/anxious.      Past Medical History:  Diagnosis Date   Back pain    Medical history non-contributory    Post partum depression    post partum    reports that she has been smoking cigarettes. She has a 1.75 pack-year smoking history. She has never used smokeless tobacco. She reports current alcohol use. She reports that she does not use drugs.   Current Outpatient Medications:    ELLA 30 MG tablet, Take by mouth., Disp: , Rfl:    hydrOXYzine (VISTARIL) 25 MG capsule, Take 1 capsule (25 mg total) by mouth 3 (three) times daily as needed for anxiety., Disp: 60 capsule, Rfl: 0   norethindrone (MICRONOR) 0.35 MG tablet, Take 1 tablet by mouth daily., Disp: , Rfl:    sertraline (ZOLOFT) 50 MG tablet, Take 1 tablet (50 mg total) by mouth daily., Disp: 30 tablet, Rfl: 0   Observations/Objective: Height 5\' 5"  (1.651 m), weight 245 lb (111.1 kg).  Physical Exam  Physical Exam Constitutional:      General: The patient is not in acute distress. Pulmonary:     Effort: Pulmonary effort is normal. No respiratory distress.  Neurological:     Mental Status: The patient is alert and oriented to  person, place, and time.  Psychiatric:        Mood and Affect: Mood normal.        Behavior: Behavior normal.   Assessment and Plan Problem List Items Addressed This Visit     Acute non-recurrent maxillary sinusitis - Primary    Make sure home COVID test negative.  Given > 2 weeks of symptoms.. treat with oral antibiotics, mucolytic, saline irrigation.  Meds ordered this encounter  Medications   amoxicillin (AMOXIL) 500 MG capsule    Sig: Take 2 capsules  (1,000 mg total) by mouth 2 (two) times daily.    Dispense:  40 capsule    Refill:  0         Relevant Medications   amoxicillin (AMOXIL) 500 MG capsule      I discussed the assessment and treatment plan with the patient. The patient was provided an opportunity to ask questions and all were answered. The patient agreed with the plan and demonstrated an understanding of the instructions.   The patient was advised to call back or seek an in-person evaluation if the symptoms worsen or if the condition fails to improve as anticipated.     Kerby Nora, MD

## 2020-10-28 DIAGNOSIS — F331 Major depressive disorder, recurrent, moderate: Secondary | ICD-10-CM | POA: Diagnosis not present

## 2020-10-28 DIAGNOSIS — F411 Generalized anxiety disorder: Secondary | ICD-10-CM | POA: Diagnosis not present

## 2020-11-27 ENCOUNTER — Emergency Department (HOSPITAL_COMMUNITY)
Admission: EM | Admit: 2020-11-27 | Discharge: 2020-11-27 | Disposition: A | Discharge status: Home / Self Care | Payer: Managed Care, Other (non HMO) | Attending: Emergency Medicine | Admitting: Emergency Medicine

## 2020-11-27 ENCOUNTER — Other Ambulatory Visit: Payer: Self-pay

## 2020-11-27 ENCOUNTER — Encounter (HOSPITAL_COMMUNITY): Payer: Self-pay | Admitting: Emergency Medicine

## 2020-11-27 DIAGNOSIS — R1030 Lower abdominal pain, unspecified: Secondary | ICD-10-CM | POA: Insufficient documentation

## 2020-11-27 DIAGNOSIS — Z5321 Procedure and treatment not carried out due to patient leaving prior to being seen by health care provider: Secondary | ICD-10-CM | POA: Insufficient documentation

## 2020-11-27 DIAGNOSIS — M546 Pain in thoracic spine: Secondary | ICD-10-CM | POA: Insufficient documentation

## 2020-11-27 DIAGNOSIS — R109 Unspecified abdominal pain: Secondary | ICD-10-CM

## 2020-11-27 LAB — COMPREHENSIVE METABOLIC PANEL
ALT: 23 U/L (ref 0–44)
AST: 21 U/L (ref 15–41)
Albumin: 4.3 g/dL (ref 3.5–5.0)
Alkaline Phosphatase: 98 U/L (ref 38–126)
Anion gap: 9 (ref 5–15)
BUN: 6 mg/dL (ref 6–20)
CO2: 23 mmol/L (ref 22–32)
Calcium: 9.7 mg/dL (ref 8.9–10.3)
Chloride: 105 mmol/L (ref 98–111)
Creatinine, Ser: 0.65 mg/dL (ref 0.44–1.00)
GFR, Estimated: >60 mL/min (ref >60)
Glucose, Bld: 92 mg/dL (ref 70–99)
Potassium: 3.6 mmol/L (ref 3.5–5.1)
Sodium: 137 mmol/L (ref 135–145)
Total Bilirubin: 0.8 mg/dL (ref 0.3–1.2)
Total Protein: 8.1 g/dL (ref 6.5–8.1)

## 2020-11-27 LAB — CBC
HCT: 43.9 % (ref 36.0–46.0)
Hemoglobin: 14.1 g/dL (ref 12.0–15.0)
MCH: 27.1 pg (ref 26.0–34.0)
MCHC: 32.1 g/dL (ref 30.0–36.0)
MCV: 84.3 fL (ref 80.0–100.0)
Platelets: 218 10*3/uL (ref 150–400)
RBC: 5.21 MIL/uL — ABNORMAL HIGH (ref 3.87–5.11)
RDW: 12.6 % (ref 11.5–15.5)
WBC: 10.5 10*3/uL (ref 4.0–10.5)
nRBC: 0 % (ref 0.0–0.2)

## 2020-11-27 LAB — LIPASE, BLOOD: Lipase: 26 U/L (ref 11–51)

## 2020-11-27 LAB — I-STAT BETA HCG BLOOD, ED (MC, WL, AP ONLY): I-stat hCG, quantitative: <5 m[IU]/mL (ref <5)

## 2020-11-27 NOTE — ED Triage Notes (Signed)
Pt presents to ED Pov. Pt c/o supra pubic pain and mid back pain x1w. Pt reports that pain is intermittent and increases in intensity every time it comes back.

## 2020-11-27 NOTE — ED Notes (Signed)
LWBS 

## 2020-11-27 NOTE — ED Provider Notes (Signed)
Emergency Medicine Provider Triage Evaluation Note  Amy Cabrera , a 25 y.o. female  was evaluated in triage.  Pt complains of low back pain that radiates to the bilat hips and into the abdomen. She further reports vaginal bleeding for 2 weeks.  Review of Systems  Positive: Back pain, abd pain, nausea Negative: Vomiting, uti sxs  Physical Exam  BP 120/74 (BP Location: Right Arm)   Pulse 94   Temp 98.3 F (36.8 C)   Resp 18   SpO2 100%  Gen:   Awake, no distress   Resp:  Normal effort  MSK:   Moves extremities without difficulty  Other:  Mild lower abd ttp  Medical Decision Making  Medically screening exam initiated at 5:01 PM.  Appropriate orders placed.  Amy Cabrera was informed that the remainder of the evaluation will be completed by another provider, this initial triage assessment does not replace that evaluation, and the importance of remaining in the ED until their evaluation is complete.     Karrie Meres, PA-C 11/27/20 1703    Pollyann Savoy, MD 11/28/20 [phone]

## 2020-11-27 NOTE — ED Notes (Signed)
NA for vitals x2 

## 2020-11-28 ENCOUNTER — Ambulatory Visit
Admission: RE | Admit: 2020-11-28 | Discharge: 2020-11-28 | Disposition: A | Discharge status: Home / Self Care | Payer: Managed Care, Other (non HMO) | Source: Ambulatory Visit | Attending: Family Medicine | Admitting: Family Medicine

## 2020-11-28 ENCOUNTER — Other Ambulatory Visit: Payer: Self-pay | Admitting: Family Medicine

## 2020-11-28 ENCOUNTER — Encounter: Payer: Self-pay | Admitting: Family Medicine

## 2020-11-28 ENCOUNTER — Ambulatory Visit (INDEPENDENT_AMBULATORY_CARE_PROVIDER_SITE_OTHER): Payer: Managed Care, Other (non HMO) | Admitting: Family Medicine

## 2020-11-28 VITALS — BP 106/60 | HR 90 | Temp 98.1°F | Ht 65.0 in | Wt 242.4 lb

## 2020-11-28 DIAGNOSIS — M545 Low back pain, unspecified: Secondary | ICD-10-CM | POA: Insufficient documentation

## 2020-11-28 DIAGNOSIS — N938 Other specified abnormal uterine and vaginal bleeding: Secondary | ICD-10-CM

## 2020-11-28 DIAGNOSIS — R1030 Lower abdominal pain, unspecified: Secondary | ICD-10-CM

## 2020-11-28 DIAGNOSIS — N83299 Other ovarian cyst, unspecified side: Secondary | ICD-10-CM

## 2020-11-28 LAB — POC URINALSYSI DIPSTICK (AUTOMATED)
Bilirubin, UA: NEGATIVE
Blood, UA: NEGATIVE
Glucose, UA: NEGATIVE
Ketones, UA: NEGATIVE
Leukocytes, UA: NEGATIVE
Nitrite, UA: NEGATIVE
Protein, UA: NEGATIVE
Spec Grav, UA: 1.025 (ref 1.010–1.025)
Urobilinogen, UA: 1 E.U./dL
pH, UA: 6 (ref 5.0–8.0)

## 2020-11-28 MED ORDER — IBUPROFEN 800 MG PO TABS: 800.0000 mg | ORAL_TABLET | Freq: Three times a day (TID) | ORAL | 0 refills | Status: DC | PRN

## 2020-11-28 NOTE — Addendum Note (Signed)
Addended by: Maisie Fus on: 11/28/2020 12:51 PM   Modules accepted: Orders

## 2020-11-28 NOTE — Patient Instructions (Addendum)
Heat on low back.  Start ibuprofen 800 mg every eight hours for pain and vaginal bleeding.  You will be called to set up the  pelvic ultrasound.  Go to ER for severe pain.

## 2020-11-28 NOTE — Progress Notes (Signed)
Patient ID: Amy Cabrera, female    DOB: 1995-10-24, 25 y.o.   MRN: [id number]  This visit was conducted in person.  Temp 98.1 F (36.7 C) (Temporal)   Ht 5\' 5"  (1.651 m)   Wt 242 lb 6 oz (109.9 kg)   BMI 40.33 kg/m    CC: Chief Complaint  Patient presents with   Back Pain    X 2 weeks radiates to hips & vagina-Went to ED last night but had to leave before getting seen   Menometrorrhagia    X weeks-Negative home pregnancy test last Thursday    Subjective:   HPI: Amy Cabrera is a 25 y.o. female presenting on 11/28/2020 for Back Pain (X 2 weeks radiates to hips & vagina-Went to ED last night but had to leave before getting seen) and Menometrorrhagia (X weeks-Negative home pregnancy test last Thursday)   She reports    new onset low back pain, centrally, radiates to bilateral hip and vaginal area. X 2 weeks.  Pain in low central abdomen.  No fall.   Numbness  intermittently in bilater legs.. when hips start hurting.   Ongoing x 2 weeks.  LMP 2 weeks ago.. 1 week prior to menses she had spotting.  Start bleeding on 11/14/20.. heavier than usual... using 6-7 super tampons per day. Continued until today it has, lightened some today. She has irregular menses  usually.  She is on Micrnor daily ( very consistent with this)... has been on this since birth of child three months ago.  Pain comes and goes, wirse with movement.  8/10 on pain scale. No diarrhea, no constipation, no dysuria.   She is currently sexually active.  No past history of ovarian cyst or fibroids.     Neg home pregnancy test last week.   Went to ER last night but left before being seen.13/4/22  BHCG negative in ER.  Lipase , CMET and cbc nml  Relevant past medical, surgical, family and social history reviewed and updated as indicated. Interim medical history since our last visit reviewed. Allergies and medications reviewed and updated. Outpatient Medications Prior to Visit  Medication Sig Dispense  Refill   amoxicillin (AMOXIL) 500 MG capsule Take 2 capsules (1,000 mg total) by mouth 2 (two) times daily. 40 capsule 0   ELLA 30 MG tablet Take by mouth.     hydrOXYzine (VISTARIL) 25 MG capsule Take 1 capsule (25 mg total) by mouth 3 (three) times daily as needed for anxiety. 60 capsule 0   norethindrone (MICRONOR) 0.35 MG tablet Take 1 tablet by mouth daily.     sertraline (ZOLOFT) 50 MG tablet Take 1 tablet (50 mg total) by mouth daily. 30 tablet 0   No facility-administered medications prior to visit.     Per HPI unless specifically indicated in ROS section below Review of Systems  Constitutional:  Positive for fatigue. Negative for fever.  HENT:  Negative for congestion.   Eyes:  Negative for pain.  Respiratory:  Negative for cough and shortness of breath.   Cardiovascular:  Negative for chest pain, palpitations and leg swelling.  Gastrointestinal:  Negative for abdominal pain, blood in stool, constipation, diarrhea and nausea.  Genitourinary:  Positive for menstrual problem, pelvic pain, vaginal bleeding and vaginal pain. Negative for dysuria and vaginal discharge.  Musculoskeletal:  Positive for back pain.  Neurological:  Negative for dizziness, syncope, light-headedness and headaches.  Psychiatric/Behavioral:  Negative for dysphoric mood.   Objective:  Temp 98.1 F (36.7  C) (Temporal)   Ht 5\' 5"  (1.651 m)   Wt 242 lb 6 oz (109.9 kg)   BMI 40.33 kg/m   Wt Readings from Last 3 Encounters:  11/28/20 242 lb 6 oz (109.9 kg)  10/21/20 245 lb (111.1 kg)  03/03/20 246 lb (111.6 kg)      Physical Exam Constitutional:      General: She is not in acute distress.    Appearance: Normal appearance. She is well-developed. She is obese. She is not ill-appearing or toxic-appearing.  HENT:     Head: Normocephalic.     Right Ear: Hearing, tympanic membrane, ear canal and external ear normal. Tympanic membrane is not erythematous, retracted or bulging.     Left Ear: Hearing, tympanic  membrane, ear canal and external ear normal. Tympanic membrane is not erythematous, retracted or bulging.     Nose: No mucosal edema or rhinorrhea.     Right Sinus: No maxillary sinus tenderness or frontal sinus tenderness.     Left Sinus: No maxillary sinus tenderness or frontal sinus tenderness.     Mouth/Throat:     Pharynx: Uvula midline.  Eyes:     General: Lids are normal. Lids are everted, no foreign bodies appreciated.     Conjunctiva/sclera: Conjunctivae normal.     Pupils: Pupils are equal, round, and reactive to light.  Neck:     Thyroid: No thyroid mass or thyromegaly.     Vascular: No carotid bruit.     Trachea: Trachea normal.  Cardiovascular:     Rate and Rhythm: Normal rate and regular rhythm.     Pulses: Normal pulses.     Heart sounds: Normal heart sounds, S1 normal and S2 normal. No murmur heard.   No friction rub. No gallop.  Pulmonary:     Effort: Pulmonary effort is normal. No tachypnea or respiratory distress.     Breath sounds: Normal breath sounds. No decreased breath sounds, wheezing, rhonchi or rales.  Abdominal:     General: Bowel sounds are normal.     Palpations: Abdomen is soft.     Tenderness: There is abdominal tenderness in the suprapubic area. There is no right CVA tenderness, left CVA tenderness, guarding or rebound.  Musculoskeletal:     Cervical back: Normal, normal range of motion and neck supple.     Thoracic back: Normal.     Lumbar back: Tenderness and bony tenderness present. Decreased range of motion. Negative right straight leg raise test and negative left straight leg raise test.  Skin:    General: Skin is warm and dry.     Findings: No rash.  Neurological:     Mental Status: She is alert.  Psychiatric:        Mood and Affect: Mood is not anxious or depressed.        Speech: Speech normal.        Behavior: Behavior normal. Behavior is cooperative.        Thought Content: Thought content normal.        Judgment: Judgment normal.       Results for orders placed or performed during the hospital encounter of 11/27/20  Lipase, blood  Result Value Ref Range   Lipase 26 11 - 51 U/L  Comprehensive metabolic panel  Result Value Ref Range   Sodium 137 135 - 145 mmol/L   Potassium 3.6 3.5 - 5.1 mmol/L   Chloride 105 98 - 111 mmol/L   CO2 23 22 - 32 mmol/L  Glucose, Bld 92 70 - 99 mg/dL   BUN 6 6 - 20 mg/dL   Creatinine, Ser 9.74 0.44 - 1.00 mg/dL   Calcium 9.7 8.9 - 16.3 mg/dL   Total Protein 8.1 6.5 - 8.1 g/dL   Albumin 4.3 3.5 - 5.0 g/dL   AST 21 15 - 41 U/L   ALT 23 0 - 44 U/L   Alkaline Phosphatase 98 38 - 126 U/L   Total Bilirubin 0.8 0.3 - 1.2 mg/dL   GFR, Estimated >84 >53 mL/min   Anion gap 9 5 - 15  CBC  Result Value Ref Range   WBC 10.5 4.0 - 10.5 K/uL   RBC 5.21 (H) 3.87 - 5.11 MIL/uL   Hemoglobin 14.1 12.0 - 15.0 g/dL   HCT 64.6 80.3 - 21.2 %   MCV 84.3 80.0 - 100.0 fL   MCH 27.1 26.0 - 34.0 pg   MCHC 32.1 30.0 - 36.0 g/dL   RDW 24.8 25.0 - 03.7 %   Platelets 218 150 - 400 K/uL   nRBC 0.0 0.0 - 0.2 %  I-Stat beta hCG blood, ED  Result Value Ref Range   I-stat hCG, quantitative <5.0 <5 mIU/mL   Comment 3            This visit occurred during the SARS-CoV-2 public health emergency.  Safety protocols were in place, including screening questions prior to the visit, additional usage of staff PPE, and extensive cleaning of exam room while observing appropriate contact time as indicated for disinfecting solutions.   COVID 19 screen:  No recent travel or known exposure to COVID19 The patient denies respiratory symptoms of COVID 19 at this time. The importance of social distancing was discussed today.   Assessment and Plan    Problem List Items Addressed This Visit     DUB (dysfunctional uterine bleeding)   Relevant Orders   US Pelvic Complete With Transvaginal   Low back pain - Primary   Relevant Medications   ibuprofen (ADVIL) 800 MG tablet   Other Relevant Orders   POCT Urinalysis  Dipstick (Automated) (Completed)   US Pelvic Complete With Transvaginal   Lower abdominal pain   Relevant Orders   US Pelvic Complete With Transvaginal   Pain in back likely referred from suprapubic region and related to menorrhagia.  Start ibuprofen 800 mg TID for pain as well as to decreased vaginal bleeding.  Continue Micrnor for now, but may need estrogen containing birth control to stabilize uterine lining.  Eval for fibroids, ovarian cyst etc with US pelvic.  Hg normal , CMET, lipase, UA and BHCPG negative ( labs form ER reviewed)  Given severity of pain.. will request [place] to be STAT.   Kerby Nora, MD

## 2020-12-02 ENCOUNTER — Other Ambulatory Visit: Payer: Managed Care, Other (non HMO)

## 2020-12-26 DIAGNOSIS — F331 Major depressive disorder, recurrent, moderate: Secondary | ICD-10-CM | POA: Diagnosis not present

## 2020-12-26 DIAGNOSIS — F411 Generalized anxiety disorder: Secondary | ICD-10-CM | POA: Diagnosis not present

## 2021-02-25 DIAGNOSIS — F331 Major depressive disorder, recurrent, moderate: Secondary | ICD-10-CM | POA: Diagnosis not present

## 2021-02-25 DIAGNOSIS — F411 Generalized anxiety disorder: Secondary | ICD-10-CM | POA: Diagnosis not present

## 2021-03-25 DIAGNOSIS — F331 Major depressive disorder, recurrent, moderate: Secondary | ICD-10-CM | POA: Diagnosis not present

## 2021-03-25 DIAGNOSIS — F411 Generalized anxiety disorder: Secondary | ICD-10-CM | POA: Diagnosis not present

## 2021-04-02 ENCOUNTER — Encounter: Payer: Self-pay | Admitting: Intensive Care

## 2021-04-02 ENCOUNTER — Emergency Department: Payer: Managed Care, Other (non HMO)

## 2021-04-02 ENCOUNTER — Inpatient Hospital Stay
Admission: EM | Admit: 2021-04-02 | Discharge: 2021-04-04 | DRG: 552 | Disposition: A | Discharge status: Home / Self Care | Payer: No Typology Code available for payment source | Attending: Internal Medicine | Admitting: Internal Medicine

## 2021-04-02 ENCOUNTER — Emergency Department: Payer: Managed Care, Other (non HMO) | Attending: Emergency Medicine

## 2021-04-02 ENCOUNTER — Other Ambulatory Visit: Payer: Self-pay

## 2021-04-02 DIAGNOSIS — G834 Cauda equina syndrome: Secondary | ICD-10-CM | POA: Diagnosis present

## 2021-04-02 DIAGNOSIS — Z20822 Contact with and (suspected) exposure to covid-19: Secondary | ICD-10-CM | POA: Diagnosis present

## 2021-04-02 DIAGNOSIS — Z803 Family history of malignant neoplasm of breast: Secondary | ICD-10-CM

## 2021-04-02 DIAGNOSIS — F17290 Nicotine dependence, other tobacco product, uncomplicated: Secondary | ICD-10-CM | POA: Diagnosis present

## 2021-04-02 DIAGNOSIS — M5117 Intervertebral disc disorders with radiculopathy, lumbosacral region: Secondary | ICD-10-CM | POA: Diagnosis present

## 2021-04-02 DIAGNOSIS — M5126 Other intervertebral disc displacement, lumbar region: Principal | ICD-10-CM

## 2021-04-02 DIAGNOSIS — M5116 Intervertebral disc disorders with radiculopathy, lumbar region: Principal | ICD-10-CM | POA: Diagnosis present

## 2021-04-02 DIAGNOSIS — Z7289 Other problems related to lifestyle: Secondary | ICD-10-CM

## 2021-04-02 DIAGNOSIS — R52 Pain, unspecified: Secondary | ICD-10-CM | POA: Diagnosis present

## 2021-04-02 DIAGNOSIS — Z833 Family history of diabetes mellitus: Secondary | ICD-10-CM

## 2021-04-02 DIAGNOSIS — M545 Low back pain, unspecified: Secondary | ICD-10-CM | POA: Diagnosis present

## 2021-04-02 DIAGNOSIS — Z28310 Unvaccinated for covid-19: Secondary | ICD-10-CM

## 2021-04-02 DIAGNOSIS — F53 Postpartum depression: Secondary | ICD-10-CM | POA: Diagnosis present

## 2021-04-02 DIAGNOSIS — Z79899 Other long term (current) drug therapy: Secondary | ICD-10-CM

## 2021-04-02 DIAGNOSIS — Z8249 Family history of ischemic heart disease and other diseases of the circulatory system: Secondary | ICD-10-CM

## 2021-04-02 DIAGNOSIS — Z800 Family history of malignant neoplasm of digestive organs: Secondary | ICD-10-CM

## 2021-04-02 DIAGNOSIS — F419 Anxiety disorder, unspecified: Secondary | ICD-10-CM | POA: Diagnosis present

## 2021-04-02 DIAGNOSIS — G8929 Other chronic pain: Secondary | ICD-10-CM | POA: Diagnosis present

## 2021-04-02 HISTORY — DX: Anxiety disorder, unspecified: F41.9

## 2021-04-02 LAB — COMPREHENSIVE METABOLIC PANEL
ALT: 30 U/L (ref 0–44)
AST: 42 U/L — ABNORMAL HIGH (ref 15–41)
Albumin: 4 g/dL (ref 3.5–5.0)
Alkaline Phosphatase: 87 U/L (ref 38–126)
Anion gap: 9 (ref 5–15)
BUN: 12 mg/dL (ref 6–20)
CO2: 20 mmol/L — ABNORMAL LOW (ref 22–32)
Calcium: 9.4 mg/dL (ref 8.9–10.3)
Chloride: 108 mmol/L (ref 98–111)
Creatinine, Ser: 0.78 mg/dL (ref 0.44–1.00)
GFR, Estimated: >60 mL/min (ref >60)
Glucose, Bld: 185 mg/dL — ABNORMAL HIGH (ref 70–99)
Potassium: 3.7 mmol/L (ref 3.5–5.1)
Sodium: 137 mmol/L (ref 135–145)
Total Bilirubin: 0.7 mg/dL (ref 0.3–1.2)
Total Protein: 7.4 g/dL (ref 6.5–8.1)

## 2021-04-02 LAB — CBC WITH DIFFERENTIAL/PLATELET
Abs Immature Granulocytes: 0.05 10*3/uL (ref 0.00–0.07)
Basophils Absolute: 0 10*3/uL (ref 0.0–0.1)
Basophils Relative: 0 %
Eosinophils Absolute: 0 10*3/uL (ref 0.0–0.5)
Eosinophils Relative: 0 %
HCT: 37 % (ref 36.0–46.0)
Hemoglobin: 12.1 g/dL (ref 12.0–15.0)
Immature Granulocytes: 1 %
Lymphocytes Relative: 8 %
Lymphs Abs: 0.8 10*3/uL (ref 0.7–4.0)
MCH: 26.8 pg (ref 26.0–34.0)
MCHC: 32.7 g/dL (ref 30.0–36.0)
MCV: 82 fL (ref 80.0–100.0)
Monocytes Absolute: 0.2 10*3/uL (ref 0.1–1.0)
Monocytes Relative: 2 %
Neutro Abs: 9.3 10*3/uL — ABNORMAL HIGH (ref 1.7–7.7)
Neutrophils Relative %: 89 %
Platelets: 183 10*3/uL (ref 150–400)
RBC: 4.51 MIL/uL (ref 3.87–5.11)
RDW: 12.6 % (ref 11.5–15.5)
WBC: 10.5 10*3/uL (ref 4.0–10.5)
nRBC: 0 % (ref 0.0–0.2)

## 2021-04-02 MED ORDER — KETOROLAC TROMETHAMINE 30 MG/ML IJ SOLN
30.0000 mg | Freq: Three times a day (TID) | INTRAMUSCULAR | Status: DC | PRN
  Administered 2021-04-03: 30 mg via INTRAVENOUS
  Filled 2021-04-02: qty 1

## 2021-04-02 MED ORDER — HYDROMORPHONE HCL 1 MG/ML IJ SOLN
1.0000 mg | INTRAMUSCULAR | Status: DC | PRN
  Administered 2021-04-03 (×2): 1 mg via INTRAVENOUS
  Filled 2021-04-02 (×2): qty 1

## 2021-04-02 MED ORDER — ONDANSETRON HCL 4 MG/2ML IJ SOLN
4.0000 mg | Freq: Once | INTRAMUSCULAR | Status: AC
  Administered 2021-04-02: 4 mg via INTRAVENOUS
  Filled 2021-04-02: qty 2

## 2021-04-02 MED ORDER — HYDROMORPHONE HCL 1 MG/ML IJ SOLN
1.0000 mg | Freq: Once | INTRAMUSCULAR | Status: AC
  Administered 2021-04-02: 1 mg via INTRAVENOUS
  Filled 2021-04-02: qty 1

## 2021-04-02 MED ORDER — NORETHINDRONE 0.35 MG PO TABS: 1.0000 | ORAL_TABLET | Freq: Every day | ORAL | Status: DC

## 2021-04-02 MED ORDER — KETOROLAC TROMETHAMINE 30 MG/ML IJ SOLN
30.0000 mg | Freq: Once | INTRAMUSCULAR | Status: AC
  Administered 2021-04-02: 30 mg via INTRAVENOUS
  Filled 2021-04-02: qty 1

## 2021-04-02 MED ORDER — HYDROXYZINE HCL 10 MG PO TABS
25.0000 mg | ORAL_TABLET | Freq: Three times a day (TID) | ORAL | Status: DC | PRN
  Filled 2021-04-02: qty 3

## 2021-04-02 MED ORDER — MORPHINE SULFATE (PF) 4 MG/ML IV SOLN
4.0000 mg | Freq: Once | INTRAVENOUS | Status: AC
  Administered 2021-04-02: 4 mg via INTRAVENOUS
  Filled 2021-04-02: qty 1

## 2021-04-02 MED ORDER — ACETAMINOPHEN 325 MG PO TABS
650.0000 mg | ORAL_TABLET | Freq: Four times a day (QID) | ORAL | Status: DC | PRN
  Administered 2021-04-02 – 2021-04-03 (×2): 650 mg via ORAL
  Filled 2021-04-02 (×2): qty 2

## 2021-04-02 MED ORDER — ONDANSETRON HCL 4 MG PO TABS: 4.0000 mg | ORAL_TABLET | Freq: Four times a day (QID) | ORAL | Status: DC | PRN

## 2021-04-02 MED ORDER — ACETAMINOPHEN 650 MG RE SUPP: 650.0000 mg | Freq: Four times a day (QID) | RECTAL | Status: DC | PRN

## 2021-04-02 MED ORDER — DEXAMETHASONE SODIUM PHOSPHATE 10 MG/ML IJ SOLN
10.0000 mg | Freq: Once | INTRAMUSCULAR | Status: AC
  Administered 2021-04-02: 10 mg via INTRAVENOUS
  Filled 2021-04-02: qty 1

## 2021-04-02 MED ORDER — LORAZEPAM 1 MG PO TABS
1.0000 mg | ORAL_TABLET | Freq: Once | ORAL | Status: AC
  Administered 2021-04-02: 1 mg via ORAL
  Filled 2021-04-02: qty 1

## 2021-04-02 MED ORDER — ORPHENADRINE CITRATE 30 MG/ML IJ SOLN
60.0000 mg | Freq: Once | INTRAMUSCULAR | Status: AC
  Administered 2021-04-02: 60 mg via INTRAVENOUS
  Filled 2021-04-02: qty 2

## 2021-04-02 MED ORDER — SERTRALINE HCL 50 MG PO TABS
75.0000 mg | ORAL_TABLET | Freq: Every day | ORAL | Status: DC
  Administered 2021-04-02 – 2021-04-04 (×3): 75 mg via ORAL
  Filled 2021-04-02 (×3): qty 2

## 2021-04-02 MED ORDER — ONDANSETRON HCL 4 MG/2ML IJ SOLN: 4.0000 mg | Freq: Four times a day (QID) | INTRAMUSCULAR | Status: DC | PRN

## 2021-04-02 NOTE — Hospital Course (Addendum)
[name] is a 26 year old female with depression and history of low back pain presents to the emergency department for chief concerns of acute onset of back pain while bending over. ? ?Vitals in the emergency department showed temperature of 98.8, respiration rate of 20, heart rate 76, blood pressure 128/79, SPO2 of 94% on room air. ? ?CBC and CMP are pending. ? ?ED treatment: Decadron 10 mg IV one-time dose, Norflex injection 60 mg IV, morphine 4 mg IV at 1639, morphine 4 mg IV at 1836, ondansetron 4 mg IV, lorazepam 1 mg p.o.  Patient was also ordered ketorolac 30 mg IV one-time dose, Dilaudid 1 mg IV one-time dose. ? ?Patient's pain remained uncontrolled. ? ?Patient being admitted to MedSurg, no telemetry, observation for pain control. ?

## 2021-04-02 NOTE — Assessment & Plan Note (Signed)
-   L4-L5 right paracentral disc protrusion with potential impingement ?- Conservative therapy and IV opioid failed in the ED ?- Admit to MedSurg, observation, no telemetry for IV pain control ?- As needed acetaminophen 650 mg p.o. every 6 hours as needed for mild pain, ketorolac 30 mg IV every 8 hours.  For moderate pain, 1 day ordered ?- Dilaudid 1 mg IV every 4 hours as needed for severe pain, 4 doses ordered ?
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
## 2021-04-02 NOTE — Assessment & Plan Note (Signed)
-   Extensive counseling with patient regarding cessation of vaping due to its acute risk ?
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
## 2021-04-02 NOTE — ED Notes (Signed)
Pt sts that she is unable to provide urine sample for pregnancy test and that she would sign a waver for the x-rays to be completed. ?

## 2021-04-02 NOTE — Assessment & Plan Note (Addendum)
-   Resumed home sertraline 75 mg daily ?- Patient states she takes sertraline 75 mg daily not 50 mg daily anymore ?

## 2021-04-02 NOTE — Plan of Care (Signed)

## 2021-04-02 NOTE — ED Notes (Signed)
See triage note  presents with back pain after bending over today   states this happened about 10 am  states pain is getting worse  unable to bear wt ? ?
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
## 2021-04-02 NOTE — Assessment & Plan Note (Signed)
-   Sertraline 75 mg daily resumed ?

## 2021-04-03 DIAGNOSIS — R52 Pain, unspecified: Secondary | ICD-10-CM | POA: Diagnosis not present

## 2021-04-03 DIAGNOSIS — M5126 Other intervertebral disc displacement, lumbar region: Secondary | ICD-10-CM | POA: Diagnosis present

## 2021-04-03 DIAGNOSIS — M5116 Intervertebral disc disorders with radiculopathy, lumbar region: Secondary | ICD-10-CM | POA: Diagnosis present

## 2021-04-03 DIAGNOSIS — Z8249 Family history of ischemic heart disease and other diseases of the circulatory system: Secondary | ICD-10-CM | POA: Diagnosis not present

## 2021-04-03 DIAGNOSIS — F17290 Nicotine dependence, other tobacco product, uncomplicated: Secondary | ICD-10-CM | POA: Diagnosis present

## 2021-04-03 DIAGNOSIS — F419 Anxiety disorder, unspecified: Secondary | ICD-10-CM | POA: Diagnosis present

## 2021-04-03 DIAGNOSIS — G834 Cauda equina syndrome: Secondary | ICD-10-CM | POA: Diagnosis present

## 2021-04-03 DIAGNOSIS — F53 Postpartum depression: Secondary | ICD-10-CM | POA: Diagnosis present

## 2021-04-03 DIAGNOSIS — Z20822 Contact with and (suspected) exposure to covid-19: Secondary | ICD-10-CM | POA: Diagnosis present

## 2021-04-03 DIAGNOSIS — Z800 Family history of malignant neoplasm of digestive organs: Secondary | ICD-10-CM | POA: Diagnosis not present

## 2021-04-03 DIAGNOSIS — M5117 Intervertebral disc disorders with radiculopathy, lumbosacral region: Secondary | ICD-10-CM | POA: Diagnosis present

## 2021-04-03 DIAGNOSIS — Z833 Family history of diabetes mellitus: Secondary | ICD-10-CM | POA: Diagnosis not present

## 2021-04-03 DIAGNOSIS — G8929 Other chronic pain: Secondary | ICD-10-CM | POA: Diagnosis present

## 2021-04-03 DIAGNOSIS — Z28310 Unvaccinated for covid-19: Secondary | ICD-10-CM | POA: Diagnosis not present

## 2021-04-03 DIAGNOSIS — Z79899 Other long term (current) drug therapy: Secondary | ICD-10-CM | POA: Diagnosis not present

## 2021-04-03 DIAGNOSIS — Z803 Family history of malignant neoplasm of breast: Secondary | ICD-10-CM | POA: Diagnosis not present

## 2021-04-03 LAB — CBC
HCT: 38.7 % (ref 36.0–46.0)
Hemoglobin: 12.7 g/dL (ref 12.0–15.0)
MCH: 27 pg (ref 26.0–34.0)
MCHC: 32.8 g/dL (ref 30.0–36.0)
MCV: 82.2 fL (ref 80.0–100.0)
Platelets: 205 10*3/uL (ref 150–400)
RBC: 4.71 MIL/uL (ref 3.87–5.11)
RDW: 12.4 % (ref 11.5–15.5)
WBC: 10 10*3/uL (ref 4.0–10.5)
nRBC: 0 % (ref 0.0–0.2)

## 2021-04-03 LAB — BASIC METABOLIC PANEL
Anion gap: 9 (ref 5–15)
BUN: 16 mg/dL (ref 6–20)
CO2: 20 mmol/L — ABNORMAL LOW (ref 22–32)
Calcium: 9.4 mg/dL (ref 8.9–10.3)
Chloride: 108 mmol/L (ref 98–111)
Creatinine, Ser: 0.71 mg/dL (ref 0.44–1.00)
GFR, Estimated: >60 mL/min (ref >60)
Glucose, Bld: 178 mg/dL — ABNORMAL HIGH (ref 70–99)
Potassium: 3.7 mmol/L (ref 3.5–5.1)
Sodium: 137 mmol/L (ref 135–145)

## 2021-04-03 LAB — HIV ANTIBODY (ROUTINE TESTING W REFLEX): HIV Screen 4th Generation wRfx: NONREACTIVE

## 2021-04-03 MED ORDER — NORETHINDRONE 0.35 MG PO TABS
1.0000 | ORAL_TABLET | Freq: Every day | ORAL | Status: DC
  Administered 2021-04-03: 0.35 mg via ORAL
  Filled 2021-04-03: qty 1

## 2021-04-03 MED ORDER — PREDNISONE 50 MG PO TABS
50.0000 mg | ORAL_TABLET | Freq: Every day | ORAL | Status: DC
  Administered 2021-04-03 – 2021-04-04 (×2): 50 mg via ORAL
  Filled 2021-04-03 (×2): qty 1

## 2021-04-03 MED ORDER — PREGABALIN 50 MG PO CAPS
50.0000 mg | ORAL_CAPSULE | Freq: Every day | ORAL | Status: DC
  Administered 2021-04-03 – 2021-04-04 (×2): 50 mg via ORAL
  Filled 2021-04-03 (×2): qty 1

## 2021-04-03 MED ORDER — IBUPROFEN 400 MG PO TABS
400.0000 mg | ORAL_TABLET | Freq: Three times a day (TID) | ORAL | Status: DC
Start: 2021-04-03 — End: 2021-04-04
  Administered 2021-04-03 – 2021-04-04 (×4): 400 mg via ORAL
  Filled 2021-04-03 (×4): qty 1

## 2021-04-03 MED ORDER — HYDROMORPHONE HCL 1 MG/ML IJ SOLN: 2.0000 mg | INTRAMUSCULAR | Status: DC | PRN

## 2021-04-03 MED ORDER — OXYCODONE HCL 5 MG PO TABS
5.0000 mg | ORAL_TABLET | ORAL | Status: DC | PRN
  Administered 2021-04-03 – 2021-04-04 (×2): 5 mg via ORAL
  Filled 2021-04-03 (×2): qty 1

## 2021-04-03 MED ORDER — IBUPROFEN 400 MG PO TABS: 400.0000 mg | ORAL_TABLET | Freq: Four times a day (QID) | ORAL | Status: DC | PRN

## 2021-04-03 NOTE — Plan of Care (Signed)
?  Problem: Education: ?Goal: Knowledge of General Education information will improve ?Description: Including pain rating scale, medication(s)/side effects and non-pharmacologic comfort measures ?04/03/2021 0139 by Berneta Levins, RN ?Outcome: Progressing ?04/03/2021 0139 by Berneta Levins, RN ?Outcome: Progressing ?04/02/2021 2325 by Berneta Levins, RN ?Outcome: Progressing ?  ?Problem: Health Behavior/Discharge Planning: ?Goal: Ability to manage health-related needs will improve ?04/03/2021 0139 by Berneta Levins, RN ?Outcome: Progressing ?04/03/2021 0139 by Berneta Levins, RN ?Outcome: Progressing ?04/02/2021 2325 by Berneta Levins, RN ?Outcome: Progressing ?  ?Problem: Clinical Measurements: ?Goal: Ability to maintain clinical measurements within normal limits will improve ?04/03/2021 0139 by Berneta Levins, RN ?Outcome: Progressing ?04/03/2021 0139 by Berneta Levins, RN ?Outcome: Progressing ?04/02/2021 2325 by Berneta Levins, RN ?Outcome: Progressing ?Goal: Will remain free from infection ?04/03/2021 0139 by Berneta Levins, RN ?Outcome: Progressing ?04/03/2021 0139 by Berneta Levins, RN ?Outcome: Progressing ?04/02/2021 2325 by Berneta Levins, RN ?Outcome: Progressing ?Goal: Diagnostic test results will improve ?04/03/2021 0139 by Berneta Levins, RN ?Outcome: Progressing ?04/03/2021 0139 by Berneta Levins, RN ?Outcome: Progressing ?04/02/2021 2325 by Berneta Levins, RN ?Outcome: Progressing ?Goal: Respiratory complications will improve ?04/03/2021 0139 by Berneta Levins, RN ?Outcome: Progressing ?04/03/2021 0139 by Berneta Levins, RN ?Outcome: Progressing ?04/02/2021 2325 by Berneta Levins, RN ?Outcome: Progressing ?Goal: Cardiovascular complication will be avoided ?04/03/2021 0139 by Berneta Levins, RN ?Outcome: Progressing ?04/03/2021 0139 by Berneta Levins, RN ?Outcome:  Progressing ?04/02/2021 2325 by Berneta Levins, RN ?Outcome: Progressing ?  ?Problem: Activity: ?Goal: Risk for activity intolerance will decrease ?04/03/2021 0139 by Berneta Levins, RN ?Outcome: Progressing ?04/03/2021 0139 by Berneta Levins, RN ?Outcome: Progressing ?04/02/2021 2325 by Berneta Levins, RN ?Outcome: Progressing ?  ?Problem: Nutrition: ?Goal: Adequate nutrition will be maintained ?04/03/2021 0139 by Berneta Levins, RN ?Outcome: Progressing ?04/03/2021 0139 by Berneta Levins, RN ?Outcome: Progressing ?04/02/2021 2325 by Berneta Levins, RN ?Outcome: Progressing ?  ?Problem: Coping: ?Goal: Level of anxiety will decrease ?04/03/2021 0139 by Berneta Levins, RN ?Outcome: Progressing ?04/03/2021 0139 by Berneta Levins, RN ?Outcome: Progressing ?04/02/2021 2325 by Berneta Levins, RN ?Outcome: Progressing ?  ?Problem: Elimination: ?Goal: Will not experience complications related to bowel motility ?04/03/2021 0139 by Berneta Levins, RN ?Outcome: Progressing ?04/03/2021 0139 by Berneta Levins, RN ?Outcome: Progressing ?04/02/2021 2325 by Berneta Levins, RN ?Outcome: Progressing ?Goal: Will not experience complications related to urinary retention ?04/03/2021 0139 by Berneta Levins, RN ?Outcome: Progressing ?04/03/2021 0139 by Berneta Levins, RN ?Outcome: Progressing ?04/02/2021 2325 by Berneta Levins, RN ?Outcome: Progressing ?  ?Problem: Pain Managment: ?Goal: General experience of comfort will improve ?04/03/2021 0139 by Berneta Levins, RN ?Outcome: Progressing ?04/03/2021 0139 by Berneta Levins, RN ?Outcome: Progressing ?04/02/2021 2325 by Berneta Levins, RN ?Outcome: Progressing ?  ?Problem: Safety: ?Goal: Ability to remain free from injury will improve ?04/03/2021 0139 by Berneta Levins, RN ?Outcome: Progressing ?04/03/2021 0139 by Berneta Levins, RN ?Outcome: Progressing ?04/02/2021  2325 by Berneta Levins, RN ?Outcome: Progressing ?  ?Problem: Skin Integrity: ?Goal: Risk for impaired skin integrity will decrease ?04/03/2021 0139 by Berneta Levins, RN ?Outcome: Progressing ?04/03/2021 0139 by Berneta Levins, RN ?Outcome: Progressing ?04/02/2021 2325 by Berneta Levins, RN ?Outcome: Progressing ?  ?

## 2021-04-03 NOTE — Evaluation (Signed)
Physical Therapy Evaluation ?Patient Details ?Name: Amy Cabrera ?MRN: [id number] ?DOB: 09/17/1995 ?Today's Date: 04/03/2021 ? ?History of Present Illness ? [name] is a 26 year old female with depression and history of low back pain presents to the emergency department for chief concerns of acute onset of back pain while bending over. ? ?  ?Clinical Impression ? Pt received seated in recliner upon arrival to room and pt agreeable to therapy.  Pt performed seated exercises prior to standing with use of FWW.  Pt does have significant pain in the lumbar spine that is radiating towards the R side.  Pt given verbal cues for standing from seated position in order to keep the lumbar spine in neutral position for decreased pain.  Pt able to perform and ambulate 125 ft and before returning to the bed.  Pt required modA for LE's to prevent lumbar flexion during transfer to supine position.  Pt performed lumbar stability exercises listed below and educated on role of OPPT.  Pt agreeable and is appropriate for d/c home with outpatient PT referral.  Pt will benefit from skilled PT intervention to increase independence and safety with basic mobility in preparation for discharge to the venue listed below.   ? ? ?   ? ?Recommendations for follow up therapy are one component of a multi-disciplinary discharge planning process, led by the attending physician.  Recommendations may be updated based on patient status, additional functional criteria and insurance authorization. ? ?Follow Up Recommendations Outpatient PT ? ?  ?Assistance Recommended at Discharge PRN  ?Patient can return home with the following ?   ? ?  ?Equipment Recommendations Rolling walker (2 wheels)  ?Recommendations for Other Services ?    ?  ?Functional Status Assessment Patient has had a recent decline in their functional status and demonstrates the ability to make significant improvements in function in a reasonable and predictable amount of time.   ? ?  ?Precautions / Restrictions Restrictions ?Weight Bearing Restrictions: No  ? ?  ? ?Mobility ? Bed Mobility ?  ?  ?  ?  ?  ?  ?  ?General bed mobility comments: pt upright in recliner upon arrival, placed back in bed with modA from therapist for LE navigation ?  ? ?Transfers ?Overall transfer level: Modified independent ?Equipment used: Rolling walker (2 wheels) ?  ?  ?  ?  ?  ?  ?  ?  ?  ? ?Ambulation/Gait ?Ambulation/Gait assistance: Min guard ?Gait Distance (Feet): 125 Feet ?Assistive device: Rolling walker (2 wheels) ?Gait Pattern/deviations: Step-to pattern, Decreased step length - left, Decreased stance time - right, Decreased stride length, Trunk flexed, Decreased weight shift to right ?Gait velocity: decreased ?  ?  ?General Gait Details: Pt with slow, steady gait that increases radicular symptoms in the R LE. ? ?Stairs ?  ?  ?  ?  ?  ? ?Wheelchair Mobility ?  ? ?Modified Rankin (Stroke Patients Only) ?  ? ?  ? ?Balance Overall balance assessment: Mild deficits observed, not formally tested ?  ?  ?  ?  ?  ?  ?  ?  ?  ?  ?  ?  ?  ?  ?  ?  ?  ?  ?   ? ? ? ?Pertinent Vitals/Pain Pain Assessment ?Pain Assessment: 0-10 ?Pain Score: 5  ?Pain Location: R Hip ?Pain Descriptors / Indicators: Pounding, Shooting, Stabbing ?Pain Intervention(s): Limited activity within patient's tolerance, Monitored during session, Premedicated before session, Repositioned  ? ? ?  Home Living Family/patient expects to be discharged to:: Private residence ?Living Arrangements: Parent ?Available Help at Discharge: Family;Available PRN/intermittently ?Type of Home: House ?Home Access: Level entry ?  ?  ?Alternate Level Stairs-Number of Steps: 13 ?Home Layout: Two level ?  ?   ?  ?Prior Function Prior Level of Function : Independent/Modified Independent ?  ?  ?  ?  ?  ?  ?  ?  ?  ? ? ?Hand Dominance  ? Dominant Hand: Left ? ?  ?Extremity/Trunk Assessment  ? Upper Extremity Assessment ?Upper Extremity Assessment: Overall WFL for tasks  assessed ?  ? ?Lower Extremity Assessment ?Lower Extremity Assessment: Generalized weakness ?  ? ?   ?Communication  ? Communication: No difficulties  ?Cognition Arousal/Alertness: Awake/alert ?Behavior During Therapy: [facility] for tasks assessed/performed ?Overall Cognitive Status: Within Functional Limits for tasks assessed ?  ?  ?  ?  ?  ?  ?  ?  ?  ?  ?  ?  ?  ?  ?  ?  ?  ?  ?  ? ?  ?General Comments   ? ?  ?Exercises Total Joint Exercises ?Ankle Circles/Pumps: AROM, Strengthening, Both, 10 reps, Seated ?Quad Sets: AROM, Strengthening, Both, 10 reps, Supine ?Gluteal Sets: AROM, Strengthening, Both, 10 reps, Supine ?Long Arc Quad: AROM, Strengthening, Both, 10 reps, Seated ?Marching in Standing: AROM, Strengthening, Both, 10 reps, Standing ?Bridges: AROM, Strengthening, 5 reps, Supine ?Other Exercises ?Other Exercises: Pt educated on OPPT and core stability as part of treatment for lumbar stability.  Pt encouraged to continue with movement of the lumbar spine, but in a pain-free movement pattern.  ? ?Assessment/Plan  ?  ?PT Assessment Patient needs continued PT services  ?PT Problem List Decreased strength;Decreased range of motion;Decreased activity tolerance;Decreased mobility;Decreased knowledge of use of DME;Decreased safety awareness;Pain ? ?   ?  ?PT Treatment Interventions DME instruction;Gait training;Stair training;Functional mobility training;Therapeutic activities;Therapeutic exercise;Balance training;Neuromuscular re-education   ? ?PT Goals (Current goals can be found in the Care Plan section)  ?Acute Rehab PT Goals ?Patient Stated Goal: to decrease pain ?PT Goal Formulation: With patient ?Time For Goal Achievement: 04/17/21 ?Potential to Achieve Goals: Good ? ?  ?Frequency Min 2X/week ?  ? ? ?Co-evaluation   ?  ?  ?  ?  ? ? ?  ?AM-PAC PT "6 Clicks" Mobility  ?Outcome Measure Help needed turning from your back to your side while in a flat bed without using bedrails?: A Little ?Help needed moving from lying  on your back to sitting on the side of a flat bed without using bedrails?: A Little ?Help needed moving to and from a bed to a chair (including a wheelchair)?: A Little ?Help needed standing up from a chair using your arms (e.g., wheelchair or bedside chair)?: A Little ?Help needed to walk in hospital room?: A Little ?Help needed climbing 3-5 steps with a railing? : A Little ?6 Click Score: 18 ? ?  ?End of Session Equipment Utilized During Treatment: Gait belt ?Activity Tolerance: Patient limited by pain ?Patient left: in bed;with call bell/phone within reach;with bed alarm set ?Nurse Communication: Mobility status ?PT Visit Diagnosis: Unsteadiness on feet (R26.81);Muscle weakness (generalized) (M62.81);Pain ?Pain - Right/Left: Right ?Pain - part of body: Hip (Lumbar Spine) ?  ? ?Time: 9629-5284 ?PT Time Calculation (min) (ACUTE ONLY): 29 min ? ? ?Charges:   PT Evaluation ?$PT Eval Low Complexity: 1 Low ?PT Treatments ?$Gait Training: 8-22 mins ?$Therapeutic Exercise: 8-22 mins ?  ?   ? ? ?  Nolon Bussing, PT, DPT ?04/03/21, 3:35 PM ? ? ?Phineas Real ?04/03/2021, 3:35 PM ? ?

## 2021-04-03 NOTE — Progress Notes (Signed)
[place] at [facility] ? ? ?PATIENT NAME: Amy Cabrera   ? ?MR#:  [id number] ? ?DATE OF BIRTH:  06/03/1995 ? ?SUBJECTIVE:  ? ?patient came in with acute low back pain while she lived strong to lift box at work. Complains of right back pain radiating to the buttock and in the front to the groin to upper thigh. Denies any fecal or urinary incontinence. Denies fall. ? ? ?VITALS:  ?Blood pressure (!) 100/52, pulse 74, temperature 98.5 ?F (36.9 ?C), resp. rate 16, height 5\' 5"  (1.651 m), weight 122.5 kg, SpO2 97 %. ? ?PHYSICAL EXAMINATION:  ? ?GENERAL:  26 y.o.-year-old patient lying in the bed with no acute distress. obese ?LUNGS: Normal breath sounds bilaterally, no wheezing, rales, rhonchi.  ?CARDIOVASCULAR: S1, S2 normal. No murmurs, rubs, or gallops.  ?ABDOMEN: Soft, nontender, nondistended. Bowel sounds present.  ?EXTREMITIES: No  edema b/l.   Decreased ROM due to pain ?NEUROLOGIC: nonfocal  patient is alert and awake ?SKIN: No obvious rash, lesion, or ulcer.  ? ?LABORATORY PANEL:  ?CBC ?Recent Labs  ?Lab 04/03/21 ?0401  ?WBC 10.0  ?HGB 12.7  ?HCT 38.7  ?PLT 205  ? ? ?Chemistries  ?Recent Labs  ?Lab 04/02/21 ?2310 04/03/21 ?0401  ?NA 137 137  ?K 3.7 3.7  ?CL 108 108  ?CO2 20* 20*  ?GLUCOSE 185* 178*  ?BUN 12 16  ?CREATININE 0.78 0.71  ?CALCIUM 9.4 9.4  ?AST 42*  --   ?ALT 30  --   ?ALKPHOS 87  --   ?BILITOT 0.7  --   ? ?Cardiac Enzymes ?No results for input(s): TROPONINI in the last 168 hours. ?RADIOLOGY:  ?DG Lumbar Spine 2-3 Views ? ?Result Date: 04/02/2021 ?CLINICAL DATA:  Low back pain EXAM: LUMBAR SPINE - 2-3 VIEW COMPARISON:  07/02/2014 CT 05/29/2019 FINDINGS: Lumbar alignment within normal limits. Mild disc space narrowing at L4-L5 without endplate irregularity. Remaining disc spaces appear patent. IMPRESSION: Mild disc space narrowing at L4-L5.  No acute osseous abnormality. Electronically Signed   By: Donavan Foil M.D.   On: 04/02/2021 17:52  ? ?MR LUMBAR SPINE WO  CONTRAST ? ?Result Date: 04/02/2021 ?CLINICAL DATA:  Low back pain, cauda equina syndrome suspected sudden severe low back pain, unable to stand/walk d/t pain EXAM: MRI LUMBAR SPINE WITHOUT CONTRAST TECHNIQUE: Multiplanar, multisequence MR imaging of the lumbar spine was performed. No intravenous contrast was administered. COMPARISON:  Lumbar radiographs from the same day. FINDINGS: Segmentation: Standard segmentation is assumed. The inferior-most fully formed intervertebral disc is labeled L5-S1. Alignment: Grade 1 retrolisthesis of L4 on L5. Otherwise, no substantial sagittal subluxation. Vertebrae: Vertebral body heights are maintained. No focal marrow edema to suggest acute fracture or discitis/osteomyelitis. No suspicious bone lesions. Conus medullaris and cauda equina: Conus extends to the L1 level. Conus appears normal. Paraspinal and other soft tissues: Unremarkable. Disc levels: T12-L1: No significant disc protrusion, foraminal stenosis, or canal stenosis. L1-L2: No significant disc protrusion, foraminal stenosis, or canal stenosis. L2-L3: No significant disc protrusion, foraminal stenosis, or canal stenosis. L3-L4: No significant disc protrusion, foraminal stenosis, or canal stenosis. L4-L5: Disc height loss and desiccation. Broad disc bulge with superimposed moderately sized right paracentral disc protrusion which contacts and potentially impinges the descending right L5 nerve roots. Mild left subarticular recess and overall canal stenosis. Foramina are patent. L5-S1: Disc height loss and desiccation. Broad disc bulge with superimposed moderately sized central disc protrusion. Disc contacts descending S1 nerve roots, mildly displacing the left S1 nerve roots. Foramina  are patent. IMPRESSION: 1. At L4-L5, moderately-sized right paracentral disc protrusion with potential impingement of the descending right L5 nerve roots. 2. At L5-S1, moderately-sized central disc protrusion which contacts the descending S1  nerve roots and mildly displaces the descending left S1 nerve roots. Electronically Signed   By: Margaretha Sheffield M.D.   On: 04/02/2021 19:50   ? ?Assessment and Plan ? Amy Cabrera is a 26 year old female with depression and history of low back pain presents to the emergency department for chief concerns of acute onset of back pain while bending over. ? ?Acute on chronic low back pain/Sciatica ?-- MRI shows L5 S1 disc protrusion with potential impingement.  ?-- will start patient on anti-inflammatory treatment with Motrin, prednisone ?-- Lyrica for neuropathic pain ?-- PRN oxycodone ?-- discussed with neurosurgery Dr. Cari Caraway. He'll see patient tomorrow ?-- physical therapy occupational therapy to see patient ? ?Anxiety ?-- on sertraline ? ?Vaping ?--advised cessation ? ? ?Procedures: ?Family communication :mother at bedside ?Consults :Neurosurgery Dr Izora Ribas ?CODE STATUS: full ?DVT Prophylaxis :SCD ?Level of care: Med-Surg ?Status is: Inpatient ?Remains inpatient appropriate because: IV pain meds for acute disc protrusion l5-s1 ?  ? ?TOTAL TIME TAKING CARE OF THIS PATIENT: 30 minutes.  ?>50% time spent on counselling and coordination of care ? ?Note: This dictation was prepared with Dragon dictation along with smaller phrase technology. Any transcriptional errors that result from this process are unintentional. ? ?Fritzi Mandes M.D  ? ? ?[facility]  ? ?CC: ?Primary care physician; Jinny Sanders, MD  ?

## 2021-04-03 NOTE — Plan of Care (Signed)
  Problem: Education: Goal: Knowledge of General Education information will improve Description: Including pain rating scale, medication(s)/side effects and non-pharmacologic comfort measures Outcome: Progressing   Problem: Activity: Goal: Risk for activity intolerance will decrease Outcome: Progressing   Problem: Coping: Goal: Level of anxiety will decrease Outcome: Progressing   

## 2021-04-03 NOTE — TOC Initial Note (Signed)
Transition of Care (TOC) - Initial/Assessment Note  ? ? ?Patient Details  ?Name: CHAQUITA BASQUES ?MRN: [id number] ?Date of Birth: [birth year] ? ?Transition of Care (TOC) CM/SW Contact:    ?Marlowe Sax, RN ?Phone Number: ?04/03/2021, 10:12 AM ? ?Clinical Narrative:           ?Transition of Care (TOC) Screening Note ? ? ?Patient Details  ?Name: SHADY PADRON ?Date of Birth: May 01, 1995 ? ? ?Transition of Care (TOC) CM/SW Contact:    ?Marlowe Sax, RN ?Phone Number: ?04/03/2021, 10:12 AM ? ? ? ?Transition of Care Department [facility]) has reviewed patient and no TOC needs have been identified at this time. We will continue to monitor patient advancement through interdisciplinary progression rounds. If new patient transition needs arise, please place a TOC consult. ?        ? ? ?  ?  ? ? ?Patient Goals and CMS Choice ?  ?  ?  ? ?Expected Discharge Plan and Services ?  ?  ?  ?  ?  ?                ?  ?  ?  ?  ?  ?  ?  ?  ?  ?  ? ?Prior Living Arrangements/Services ?  ?  ?  ?       ?  ?  ?  ?  ? ?Activities of Daily Living ?Home Assistive Devices/Equipment: None ?ADL Screening (condition at time of admission) ?Patient's cognitive ability adequate to safely complete daily activities?: Yes ?Is the patient deaf or have difficulty hearing?: No ?Does the patient have difficulty seeing, even when wearing glasses/contacts?: No ?Does the patient have difficulty concentrating, remembering, or making decisions?: No ?Patient able to express need for assistance with ADLs?: Yes ?Does the patient have difficulty dressing or bathing?: Yes ?Independently performs ADLs?: No ?Communication: Independent ?Dressing (OT): Needs assistance ?Is this a change from baseline?: Change from baseline, expected to last <3days ?Grooming: Independent ?Feeding: Independent ?Bathing: Needs assistance ?Is this a change from baseline?: Change from baseline, expected to last <3 days ?Toileting: Needs assistance ?Is this a change from baseline?: Change from  baseline, expected to last <3 days ?In/Out Bed: Needs assistance ?Is this a change from baseline?: Change from baseline, expected to last <3 days ?Walks in Home: Needs assistance ?Is this a change from baseline?: Change from baseline, expected to last <3 days ?Does the patient have difficulty walking or climbing stairs?: Yes ?Weakness of Legs: None ?Weakness of Arms/Hands: None ? ?Permission Sought/Granted ?  ?  ?   ?   ?   ?   ? ?Emotional Assessment ?  ?  ?  ?  ?  ?  ? ?Admission diagnosis:  Lumbar herniated disc [M51.26] ?Inadequate pain control [R52] ?Patient Active Problem List  ? Diagnosis Date Noted  ? Inadequate pain control 04/02/2021  ? Anxiety 04/02/2021  ? Engages in vaping 04/02/2021  ? DUB (dysfunctional uterine bleeding) 11/28/2020  ? Lower abdominal pain 11/28/2020  ? Acute non-recurrent maxillary sinusitis 10/21/2020  ? Cutaneous abscess of right foot   ? Postpartum depression 10/12/2017  ? Family history of breast cancer 03/03/2017  ? Family history of type 2 diabetes mellitus 03/03/2017  ? Allergic rhinitis 10/01/2014  ? Low back pain 07/02/2014  ? 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 ?PCP:  Excell Seltzer, MD ?Pharmacy:   ?[facility] [address] [place] - [address] ?[address] ?[place] [address] ?Phone:  [phone] Fax: [phone] ? ? ? ? ?Social Determinants of Health (SDOH) Interventions ?  ? ?Readmission Risk Interventions ?   ? View : No data to display.  ?  ?  ?  ? ? ? ?

## 2021-04-03 NOTE — Plan of Care (Signed)
?  Problem: Education: ?Goal: Knowledge of General Education information will improve ?Description: Including pain rating scale, medication(s)/side effects and non-pharmacologic comfort measures ?04/03/2021 2012 by Deirdre Pippins, RN ?Outcome: Progressing ?04/03/2021 2008 by Deirdre Pippins, RN ?Outcome: Progressing ?  ?Problem: Activity: ?Goal: Risk for activity intolerance will decrease ?04/03/2021 2012 by Deirdre Pippins, RN ?Outcome: Progressing ?04/03/2021 2008 by Deirdre Pippins, RN ?Outcome: Progressing ?  ?Problem: Nutrition: ?Goal: Adequate nutrition will be maintained ?Outcome: Progressing ?  ?Problem: Coping: ?Goal: Level of anxiety will decrease ?Outcome: Progressing ?  ?Problem: Elimination: ?Goal: Will not experience complications related to bowel motility ?Outcome: Progressing ?  ?Problem: Pain Managment: ?Goal: General experience of comfort will improve ?Outcome: Progressing ?  ?Problem: Safety: ?Goal: Ability to remain free from injury will improve ?Outcome: Progressing ?  ?Problem: Skin Integrity: ?Goal: Risk for impaired skin integrity will decrease ?Outcome: Progressing ?  ?

## 2021-04-03 NOTE — Plan of Care (Signed)
?  Problem: Education: ?Goal: Knowledge of General Education information will improve ?Description: Including pain rating scale, medication(s)/side effects and non-pharmacologic comfort measures ?04/03/2021 0139 by Berneta Levins, RN ?Outcome: Progressing ?04/02/2021 2325 by Berneta Levins, RN ?Outcome: Progressing ?  ?Problem: Health Behavior/Discharge Planning: ?Goal: Ability to manage health-related needs will improve ?04/03/2021 0139 by Berneta Levins, RN ?Outcome: Progressing ?04/02/2021 2325 by Berneta Levins, RN ?Outcome: Progressing ?  ?Problem: Clinical Measurements: ?Goal: Ability to maintain clinical measurements within normal limits will improve ?04/03/2021 0139 by Berneta Levins, RN ?Outcome: Progressing ?04/02/2021 2325 by Berneta Levins, RN ?Outcome: Progressing ?Goal: Will remain free from infection ?04/03/2021 0139 by Berneta Levins, RN ?Outcome: Progressing ?04/02/2021 2325 by Berneta Levins, RN ?Outcome: Progressing ?Goal: Diagnostic test results will improve ?04/03/2021 0139 by Berneta Levins, RN ?Outcome: Progressing ?04/02/2021 2325 by Berneta Levins, RN ?Outcome: Progressing ?Goal: Respiratory complications will improve ?04/03/2021 0139 by Berneta Levins, RN ?Outcome: Progressing ?04/02/2021 2325 by Berneta Levins, RN ?Outcome: Progressing ?Goal: Cardiovascular complication will be avoided ?04/03/2021 0139 by Berneta Levins, RN ?Outcome: Progressing ?04/02/2021 2325 by Berneta Levins, RN ?Outcome: Progressing ?  ?Problem: Activity: ?Goal: Risk for activity intolerance will decrease ?04/03/2021 0139 by Berneta Levins, RN ?Outcome: Progressing ?04/02/2021 2325 by Berneta Levins, RN ?Outcome: Progressing ?  ?Problem: Nutrition: ?Goal: Adequate nutrition will be maintained ?04/03/2021 0139 by Berneta Levins, RN ?Outcome: Progressing ?04/02/2021 2325 by Berneta Levins, RN ?Outcome:  Progressing ?  ?Problem: Coping: ?Goal: Level of anxiety will decrease ?04/03/2021 0139 by Berneta Levins, RN ?Outcome: Progressing ?04/02/2021 2325 by Berneta Levins, RN ?Outcome: Progressing ?  ?Problem: Elimination: ?Goal: Will not experience complications related to bowel motility ?04/03/2021 0139 by Berneta Levins, RN ?Outcome: Progressing ?04/02/2021 2325 by Berneta Levins, RN ?Outcome: Progressing ?Goal: Will not experience complications related to urinary retention ?04/03/2021 0139 by Berneta Levins, RN ?Outcome: Progressing ?04/02/2021 2325 by Berneta Levins, RN ?Outcome: Progressing ?  ?Problem: Pain Managment: ?Goal: General experience of comfort will improve ?04/03/2021 0139 by Berneta Levins, RN ?Outcome: Progressing ?04/02/2021 2325 by Berneta Levins, RN ?Outcome: Progressing ?  ?Problem: Safety: ?Goal: Ability to remain free from injury will improve ?04/03/2021 0139 by Berneta Levins, RN ?Outcome: Progressing ?04/02/2021 2325 by Berneta Levins, RN ?Outcome: Progressing ?  ?Problem: Skin Integrity: ?Goal: Risk for impaired skin integrity will decrease ?04/03/2021 0139 by Berneta Levins, RN ?Outcome: Progressing ?04/02/2021 2325 by Berneta Levins, RN ?Outcome: Progressing ?  ?

## 2021-04-04 MED ORDER — METHYLPREDNISOLONE 4 MG PO TBPK: 4.0000 mg | ORAL_TABLET | ORAL | Status: DC

## 2021-04-04 MED ORDER — METHYLPREDNISOLONE 4 MG PO TBPK: ORAL_TABLET | ORAL | 0 refills | Status: DC

## 2021-04-04 MED ORDER — METHYLPREDNISOLONE 4 MG PO TBPK: 8.0000 mg | ORAL_TABLET | Freq: Every evening | ORAL | Status: DC

## 2021-04-04 MED ORDER — IBUPROFEN 400 MG PO TABS: 400.0000 mg | ORAL_TABLET | Freq: Three times a day (TID) | ORAL | 0 refills | Status: DC

## 2021-04-04 MED ORDER — METHYLPREDNISOLONE 4 MG PO TBPK: 8.0000 mg | ORAL_TABLET | Freq: Every morning | ORAL | Status: DC

## 2021-04-04 MED ORDER — METHYLPREDNISOLONE 4 MG PO TBPK: 4.0000 mg | ORAL_TABLET | Freq: Three times a day (TID) | ORAL | Status: DC

## 2021-04-04 MED ORDER — METHYLPREDNISOLONE 4 MG PO TBPK: 4.0000 mg | ORAL_TABLET | Freq: Four times a day (QID) | ORAL | Status: DC

## 2021-04-04 MED ORDER — PREGABALIN 50 MG PO CAPS
50.0000 mg | ORAL_CAPSULE | Freq: Every day | ORAL | 0 refills | Status: DC
Start: 2021-04-05 — End: 2021-07-21

## 2021-04-04 NOTE — Consult Note (Signed)
? ?Referring Physician:  ?No referring provider defined for this encounter. ? ?Primary Physician:  ?Excell Seltzer, MD ? ?Chief Complaint:  R leg pain, back pain ? ?History of Present Illness: ?04/04/2021 ?Amy Cabrera is a 26 y.o. female who presents with the chief complaint of back pain and right leg pain.  She began having back pain when she was bending over at work on Thursday.  She has had back pain before, but this was something new that started when she bent over at work.  She describes pain mostly down her right leg down the lateral aspect of her thigh and calf.  She has some pain around her ankle.  She was able to walk with a walker yesterday. ? ?She denies weakness or bowel or bladder changes ? ?The symptoms are causing a significant impact on the patient's life.  ? ?Review of Systems:  ?A 10 point review of systems is negative, except for the pertinent positives and negatives detailed in the HPI. ? ?Past Medical History: ?Past Medical History:  ?Diagnosis Date  ? Anxiety   ? Back pain   ? Medical history non-contributory   ? Post partum depression   ? post partum  ? ? ?Past Surgical History: ?Past Surgical History:  ?Procedure Laterality Date  ? CESAREAN SECTION N/A 08/13/2017  ? Procedure: CESAREAN SECTION;  Surgeon: Hildred Laser, MD;  Location: [facility] ORS;  Service: Obstetrics;  Laterality: N/A;  ? I & D EXTREMITY Right 12/20/2018  ? Procedure: RIGHT FOOT DEBRIDEMENT;  Surgeon: Nadara Mustard, MD;  Location: [facility] OR;  Service: Orthopedics;  Laterality: Right;  ? WISDOM TOOTH EXTRACTION    ? ? ?Allergies: ?Allergies as of 04/02/2021  ? (No Known Allergies)  ? ? ?Medications: ? ?Current Facility-Administered Medications:  ?  acetaminophen (TYLENOL) tablet 650 mg, 650 mg, Oral, Q6H PRN, 650 mg at 04/03/21 0451 **OR** acetaminophen (TYLENOL) suppository 650 mg, 650 mg, Rectal, Q6H PRN, Cox, Amy N, DO ?  HYDROmorphone (DILAUDID) injection 2 mg, 2 mg, Intravenous, Q4H PRN, Enedina Finner, MD ?  hydrOXYzine  (ATARAX) tablet 25 mg, 25 mg, Oral, TID PRN, Cox, Amy N, DO ?  ibuprofen (ADVIL) tablet 400 mg, 400 mg, Oral, TID WC & HS, Enedina Finner, MD, 400 mg at 04/03/21 2141 ?  norethindrone (MICRONOR) 0.35 MG tablet 0.35 mg, 1 tablet, Oral, QHS, Nazari, Walid A, RPH, 0.35 mg at 04/03/21 2141 ?  ondansetron (ZOFRAN) tablet 4 mg, 4 mg, Oral, Q6H PRN **OR** ondansetron (ZOFRAN) injection 4 mg, 4 mg, Intravenous, Q6H PRN, Cox, Amy N, DO ?  oxyCODONE (Oxy IR/ROXICODONE) immediate release tablet 5 mg, 5 mg, Oral, Q4H PRN, Enedina Finner, MD, 5 mg at 04/04/21 0453 ?  predniSONE (DELTASONE) tablet 50 mg, 50 mg, Oral, Q breakfast, Enedina Finner, MD, 50 mg at 04/03/21 1010 ?  pregabalin (LYRICA) capsule 50 mg, 50 mg, Oral, Daily, Enedina Finner, MD, 50 mg at 04/03/21 1006 ?  sertraline (ZOLOFT) tablet 75 mg, 75 mg, Oral, Daily, Cox, Amy N, DO, 75 mg at 04/03/21 1006 ? ? ?Social History: ?Social History  ? ?Tobacco Use  ? Smoking status: Every Day  ?  Types: E-cigarettes  ? Smokeless tobacco: Never  ?Vaping Use  ? Vaping Use: Every day  ? Devices: just tried it   ?Substance Use Topics  ? Alcohol use: Yes  ?  Alcohol/week: 0.0 standard drinks  ?  Comment: social  ? Drug use: No  ? ? ?Family Medical History: ?Family History  ?Problem  Relation Age of Onset  ? Cancer Maternal Grandmother   ?     breast CA  ? Heart disease Maternal Grandfather   ?     CAD  ? Cancer Maternal Grandfather   ?     colon CA  ? Diabetes Maternal Grandfather   ? ? ?Physical Examination: ?Vitals:  ? 04/03/21 2112 04/04/21 0445  ?BP: (!) 103/58 (!) 101/58  ?Pulse: 73 65  ?Resp: 16 16  ?Temp: 98.4 ?F (36.9 ?C) 98.3 ?F (36.8 ?C)  ?SpO2: 99% 99%  ? ? ? ?General: Patient is well developed, well nourished, calm, collected, and in no apparent distress. ? ?Psychiatric: Patient is non-anxious. ? ?Head:  Pupils equal, round, and reactive to light. ? ?ENT:  Oral mucosa appears well hydrated. ? ?Neck:   Supple.  Full range of motion. ? ?Respiratory: Patient is breathing without any  difficulty. ? ?Extremities: No edema. ? ?Vascular: Palpable pulses in dorsal pedal vessels. ? ?Skin:   On exposed skin, there are no abnormal skin lesions. ? ?NEUROLOGICAL:  ?General: In no acute distress.   ?Awake, alert, oriented to person, place, and time.  Pupils equal round and reactive to light.  Facial tone is symmetric.  Tongue protrusion is midline.  There is no pronator drift. ? ? ?Strength: ?Side Biceps Triceps Deltoid Interossei Grip Wrist Ext. Wrist Flex.  ?R 5 5 5 5 5 5 5   ?L 5 5 5 5 5 5 5   ? ?Side Iliopsoas Quads Hamstring PF DF EHL  ?R 5 5 5 5 5 5   ?L 5 5 5 5 5 5   ? ? ?Bilateral upper and lower extremity sensation is intact to light touch. ?Reflexes are 1+ and symmetric at the biceps, triceps, brachioradialis, patella and achilles. Hoffman's is absent. ? ?Clonus is not present.  Toes are down-going.   ? ?Gait is untested ? ?Imaging: ?MRI L spine 04/02/2021 ?IMPRESSION: ?1. At L4-L5, moderately-sized right paracentral disc protrusion with ?potential impingement of the descending right L5 nerve roots. ?2. At L5-S1, moderately-sized central disc protrusion which contacts ?the descending S1 nerve roots and mildly displaces the descending ?left S1 nerve roots. ?  ?  ?Electronically Signed ?  By: M.D. ?  On: 04/02/2021 19:50 ? ?I have personally reviewed the images and agree with the above interpretation. ? ?Labs: ? ?  Latest Ref Rng & Units 04/03/2021  ?  4:01 AM 04/02/2021  ? 11:10 PM 11/27/2020  ?  5:13 PM  ?CBC  ?WBC 4.0 - 10.5 K/uL 10.0   10.5   10.5    ?Hemoglobin 12.0 - 15.0 g/dL 04/04/2021   04/05/2021   04/04/2021    ?Hematocrit 36.0 - 46.0 % 38.7   37.0   43.9    ?Platelets 150 - 400 K/uL 205   183   218    ? ? ? ? ? ?Assessment and Plan: ?Amy Cabrera is a pleasant 26 y.o. female with acute right L5 radiculopathy due to disc herniation at the L4-5 level.  Based on her history, this likely happened during the event at her work on Thursday. ? ?We reviewed the management paradigm for acute lumbar  radiculopathy.  I recommended that she continue with physical therapy, medications such as NSAIDs, neuropathic pain medication, and muscle actions, and consider an epidural steroid injection.  If she follows this management algorithm, there is a substantial chance as high as 90% that she will be able to obtain adequate pain control without surgery.  We  did review that she should be very careful with bending, lifting, and twisting.  I would maintain a lifting limit of 10 pounds until she feels better. ? ?We will arrange follow-up for her in the clinic.  I discussed this with hospitalist Dr. Allena KatzPatel. ? ? ? ?Tesneem Dufrane K. Myer HaffYarbrough MD, MPHS ?Dept. of Neurosurgery ?  ? ?

## 2021-04-04 NOTE — Plan of Care (Signed)

## 2021-04-04 NOTE — Discharge Instructions (Signed)
Limit weight lifting upto 10 lbs only ?OUT Patient Referral made for PT ?

## 2021-04-04 NOTE — Progress Notes (Signed)
Physical Therapy Treatment ?Patient Details ?Name: Amy Cabrera ?MRN: [id number] ?DOB: [birth year] ?Today's Date: 04/04/2021 ? ? ?History of Present Illness [name] is a 26 year old female with depression and history of low back pain presents to the emergency department for chief concerns of acute onset of back pain while bending over. ? ?  ?PT Comments  ? ? Pt seen for PT tx with pt agreeable. Pt is able to complete STS without assistance but with very guarded movements. Pt ambulates 1 lap around nurses station with RW & supervision progressing to mod I with pt demonstrating improving weight shift to RLE as gait progresses. Educated pt on using bag attached to front of RW to carry items as needed at home. Pt reports she has assistance from family at home & denies any concerns re: d/c home today. ?   ?Recommendations for follow up therapy are one component of a multi-disciplinary discharge planning process, led by the attending physician.  Recommendations may be updated based on patient status, additional functional criteria and insurance authorization. ? ?Follow Up Recommendations ? Outpatient PT ?  ?  ?Assistance Recommended at Discharge PRN  ?Patient can return home with the following A little help with bathing/dressing/bathroom;Assistance with cooking/housework;Help with stairs or ramp for entrance ?  ?Equipment Recommendations ? Rolling walker (2 wheels)  ?  ?Recommendations for Other Services   ? ? ?  ?Precautions / Restrictions Precautions ?Precautions: None ?Restrictions ?Weight Bearing Restrictions: No  ?  ? ?Mobility ? Bed Mobility ?  ?  ?  ?  ?  ?  ?  ?General bed mobility comments: not observed, pt received & left sitting in recliner ?  ? ?Transfers ?Overall transfer level: Modified independent ?Equipment used: Rolling walker (2 wheels) ?  ?  ?  ?  ?  ?  ?  ?General transfer comment: STS from recliner with pt moving in a very guarded fashion & pt somewhat leaning posteriorly ?   ? ?Ambulation/Gait ?Ambulation/Gait assistance: Supervision, Modified independent (Device/Increase time) ?Gait Distance (Feet): 165 Feet ?Assistive device: Rolling walker (2 wheels) ?Gait Pattern/deviations: Step-through pattern, Decreased stride length ?Gait velocity: decreased ?  ?  ?General Gait Details: Pt initially with decreased weight shift to RLE that improves as she continues to walk ? ? ?Stairs ?  ?  ?  ?  ?  ? ? ?Wheelchair Mobility ?  ? ?Modified Rankin (Stroke Patients Only) ?  ? ? ?  ?Balance Overall balance assessment: Needs assistance ?  ?Sitting balance-Leahy Scale: Good ?  ?  ?Standing balance support: Reliant on assistive device for balance (2/2 back pain) ?Standing balance-Leahy Scale: Fair ?  ?  ?  ?  ?  ?  ?  ?  ?  ?  ?  ?  ?  ? ?  ?Cognition Arousal/Alertness: Awake/alert ?Behavior During Therapy: [facility] for tasks assessed/performed ?Overall Cognitive Status: Within Functional Limits for tasks assessed ?  ?  ?  ?  ?  ?  ?  ?  ?  ?  ?  ?  ?  ?  ?  ?  ?  ?  ?  ? ?  ?Exercises   ? ?  ?General Comments   ?  ?  ? ?Pertinent Vitals/Pain Pain Assessment ?Pain Assessment: 0-10 ?Pain Score: 5  (at rest) ?Pain Location: back, R LE ?Pain Descriptors / Indicators: Grimacing, Discomfort, Guarding ?Pain Intervention(s): Monitored during session, Limited activity within patient's tolerance  ? ? ?Home Living   ?  ?  ?  ?  ?  ?  ?  ?  ?  ?   ?  ?  Prior Function    ?  ?  ?   ? ?PT Goals (current goals can now be found in the care plan section) Acute Rehab PT Goals ?Patient Stated Goal: to decrease pain ?PT Goal Formulation: With patient ?Time For Goal Achievement: 04/17/21 ?Potential to Achieve Goals: Good ?Progress towards PT goals: Progressing toward goals ? ?  ?Frequency ? ? ? Min 2X/week ? ? ? ?  ?PT Plan Current plan remains appropriate  ? ? ?Co-evaluation   ?  ?  ?  ?  ? ?  ?AM-PAC PT "6 Clicks" Mobility   ?Outcome Measure ? Help needed turning from your back to your side while in a flat bed without using  bedrails?: None ?Help needed moving from lying on your back to sitting on the side of a flat bed without using bedrails?: A Little ?Help needed moving to and from a bed to a chair (including a wheelchair)?: A Little ?Help needed standing up from a chair using your arms (e.g., wheelchair or bedside chair)?: None ?Help needed to walk in hospital room?: A Little ?Help needed climbing 3-5 steps with a railing? : A Little ?6 Click Score: 20 ? ?  ?End of Session   ?Activity Tolerance: Patient tolerated treatment well;Patient limited by pain ?Patient left: in chair;with call bell/phone within reach ?Nurse Communication: Mobility status ?PT Visit Diagnosis: Unsteadiness on feet (R26.81);Muscle weakness (generalized) (M62.81);Pain ?Pain - Right/Left: Right ?Pain - part of body: Leg (back, RLE) ?  ? ? ?Time: 9735-3299 ?PT Time Calculation (min) (ACUTE ONLY): 11 min ? ?Charges:  $Therapeutic Activity: 8-22 mins          ?          ? ?Aleda Grana, PT, DPT ?04/04/21, 11:05 AM ? ? ? ?Sandi Mariscal ?04/04/2021, 11:04 AM ? ?

## 2021-04-04 NOTE — Discharge Summary (Signed)
?Physician Discharge Summary ?  ?Patient: Amy Cabrera MRN: [id number] DOB: Jan 03, 1996  ?Admit date:     04/02/2021  ?Discharge date: 04/04/21  ?Discharge Physician: Fritzi Mandes  ? ?PCP: Jinny Sanders, MD  ? ?Recommendations at discharge:  ? ? No heavy lifting. Limit to 10 lbs ? ?Discharge Diagnoses: ?L4-5 Lumbar disc protrusion with Radiculopathy ? ?Hospital Course: ? ?Amy Cabrera is a 26 year old female with depression and history of low back pain presents to the emergency department for chief concerns of acute onset of back pain while bending over. ?  ?Acute on chronic low back pain/Sciatica ?-- MRI shows L4-5 disc protrusion with nerve impingement.  ?--  patient on anti-inflammatory treatment with Motrin, prednisone ?-- Lyrica for neuropathic pain ?-- discussed with neurosurgery Dr. Cari Caraway. appreciate input/recs. Pt will f/u outpt ?-- physical therapy recommends Out pt PT ?--ambulated very well around the nurses station with RW.  ?--feels better and ok to go home. D/w neurosurgery and concurs with plan. ?  ?Anxiety ?-- on sertraline ?  ?Vaping ?--advised cessation ?  ?  ? ?  ? ? ?Consultants: Neurosurgery ?Procedures performed: none  ?Disposition: Home ?Diet recommendation:  ?Regular diet ?DISCHARGE MEDICATION: ?Allergies as of 04/04/2021   ?No Known Allergies ?  ? ?  ?Medication List  ?  ? ?TAKE these medications   ? ?hydrOXYzine 25 MG capsule ?Commonly known as: Vistaril ?Take 1 capsule (25 mg total) by mouth 3 (three) times daily as needed for anxiety. ?  ?ibuprofen 400 MG tablet ?Commonly known as: ADVIL ?Take 1 tablet (400 mg total) by mouth 4 (four) times daily -  with meals and at bedtime. Take scheduled for 3-4 days with meals and thereafter as needed ?  ?methylPREDNISolone 4 MG Tbpk tablet ?Commonly known as: MEDROL DOSEPAK ?Take as directed ?Start taking on: April 05, 2021 ?  ?norethindrone 0.35 MG tablet ?Commonly known as: MICRONOR ?Take 1 tablet by mouth daily. ?  ?pregabalin 50 MG  capsule ?Commonly known as: LYRICA ?Take 1 capsule (50 mg total) by mouth daily. ?Start taking on: April 05, 2021 ?  ?sertraline 50 MG tablet ?Commonly known as: Zoloft ?Take 1 tablet (50 mg total) by mouth daily. ?  ? ?  ? ? Follow-up Information   ? ? Bedsole, Amy E, MD. Schedule an appointment as soon as possible for a visit in 1 week(s).   ?Specialty: Family Medicine ?Why: hospital f/u ?Contact information: ?[address] ?[place] [address] ?[phone] ? ? ?  ?  ? ? Meade Maw, MD Follow up.   ?Specialty: Neurosurgery ?Why: f/u on the appt that will be called to you ?Contact information: ?[place] [address] ?[phone] ? ? ?  ?  ? ?  ?  ? ?  ? ?Discharge Exam: ?Filed Weights  ? 04/02/21 1500  ?Weight: 122.5 kg  ? ? ? ?Condition at discharge: fair ? ?The results of significant diagnostics from this hospitalization (including imaging, microbiology, ancillary and laboratory) are listed below for reference.  ? ?Imaging Studies: ?DG Lumbar Spine 2-3 Views ? ?Result Date: 04/02/2021 ?CLINICAL DATA:  Low back pain EXAM: LUMBAR SPINE - 2-3 VIEW COMPARISON:  07/02/2014 CT 05/29/2019 FINDINGS: Lumbar alignment within normal limits. Mild disc space narrowing at L4-L5 without endplate irregularity. Remaining disc spaces appear patent. IMPRESSION: Mild disc space narrowing at L4-L5.  No acute osseous abnormality. Electronically Signed   By: Donavan Foil M.D.   On: 04/02/2021 17:52  ? ?  MR LUMBAR SPINE WO CONTRAST ? ?Result Date: 04/02/2021 ?CLINICAL DATA:  Low back pain, cauda equina syndrome suspected sudden severe low back pain, unable to stand/walk d/t pain EXAM: MRI LUMBAR SPINE WITHOUT CONTRAST TECHNIQUE: Multiplanar, multisequence MR imaging of the lumbar spine was performed. No intravenous contrast was administered. COMPARISON:  Lumbar radiographs from the same day. FINDINGS: Segmentation: Standard segmentation is assumed. The inferior-most fully formed intervertebral disc  is labeled L5-S1. Alignment: Grade 1 retrolisthesis of L4 on L5. Otherwise, no substantial sagittal subluxation. Vertebrae: Vertebral body heights are maintained. No focal marrow edema to suggest acute fracture or discitis/osteomyelitis. No suspicious bone lesions. Conus medullaris and cauda equina: Conus extends to the L1 level. Conus appears normal. Paraspinal and other soft tissues: Unremarkable. Disc levels: T12-L1: No significant disc protrusion, foraminal stenosis, or canal stenosis. L1-L2: No significant disc protrusion, foraminal stenosis, or canal stenosis. L2-L3: No significant disc protrusion, foraminal stenosis, or canal stenosis. L3-L4: No significant disc protrusion, foraminal stenosis, or canal stenosis. L4-L5: Disc height loss and desiccation. Broad disc bulge with superimposed moderately sized right paracentral disc protrusion which contacts and potentially impinges the descending right L5 nerve roots. Mild left subarticular recess and overall canal stenosis. Foramina are patent. L5-S1: Disc height loss and desiccation. Broad disc bulge with superimposed moderately sized central disc protrusion. Disc contacts descending S1 nerve roots, mildly displacing the left S1 nerve roots. Foramina are patent. IMPRESSION: 1. At L4-L5, moderately-sized right paracentral disc protrusion with potential impingement of the descending right L5 nerve roots. 2. At L5-S1, moderately-sized central disc protrusion which contacts the descending S1 nerve roots and mildly displaces the descending left S1 nerve roots. Electronically Signed   By: Margaretha Sheffield M.D.   On: 04/02/2021 19:50   ? ?Microbiology: ?Results for orders placed or performed during the hospital encounter of 05/29/19  ?Urine culture     Status: None  ? Collection Time: 05/29/19 12:11 PM  ? Specimen: Urine, Clean Catch  ?Result Value Ref Range Status  ? Specimen Description   Final  ?  URINE, CLEAN CATCH ?Performed at [facility], [address], [place], [place] [address] ?  ? Special Requests   Final  ?  NONE ?Performed at [facility], [address] [place], [place] [address] ?  ? Culture   Final  ?  NO GROWTH ?Performed at [facility], [place] [address] [place], [place] [address] ?  ? Report Status 05/30/2019 FINAL  Final  ?SARS Coronavirus 2 by RT PCR (hospital order, performed in [facility] hospital lab) Nasopharyngeal Nasopharyngeal Swab     Status: None  ? Collection Time: 05/29/19  1:43 PM  ? Specimen: Nasopharyngeal Swab  ?Result Value Ref Range Status  ? SARS Coronavirus 2 NEGATIVE NEGATIVE Final  ?  Comment: (NOTE) ?SARS-CoV-2 target nucleic acids are NOT DETECTED. ?The SARS-CoV-2 RNA is generally detectable in upper and lower ?respiratory specimens during the acute phase of infection. The lowest ?concentration of SARS-CoV-2 viral copies this assay can detect is 250 ?copies / mL. A negative result does not preclude SARS-CoV-2 infection ?and should not be used as the sole basis for treatment or other ?patient management decisions.  A negative result may occur with ?improper specimen collection / handling, submission of specimen other ?than nasopharyngeal swab, presence of viral mutation(s) within the ?areas targeted by this assay, and inadequate number of viral copies ?(<250 copies / mL). A negative result must be combined with clinical ?observations, patient history, and epidemiological information. ?  Fact Sheet for Patients:   ?StrictlyIdeas.no ?Fact Sheet for Healthcare Providers: ?BankingDealers.co.za ?This test is not yet approved or cleared  by the [place] FDA and ?has been authorized for detection and/or diagnosis of SARS-CoV-2 by ?FDA under an Emergency Use Authorization (EUA).  This EUA will remain ?in effect (meaning this test can be used) for the duration of the ?COVID-19 declaration under Section 564(b)(1) of the Act, 21 U.S.C. ?section 360bbb-3(b)(1), unless the  authorization is terminated or ?revoked sooner. ?Performed at [facility], [place], ?[place] [address] ?  ? ? ?Labs: ?CBC: ?Recent Labs  ?Lab 04/02/21 ?2310 04/03/21 ?0401  ?WBC

## 2021-04-04 NOTE — TOC Transition Note (Signed)
Transition of Care (TOC) - CM/SW Discharge Note ? ? ?Patient Details  ?Name: GLORA JEANPHILIPPE ?MRN: [id number] ?Date of Birth: 1995/01/28 ? ?Transition of Care (TOC) CM/SW Contact:  ?Izola Price, RN ?Phone Number: ?04/04/2021, 10:11 AM ? ? ?Clinical Narrative:  Patient discharging today, has a ride home. Will obtain a RW from a relative per unit RN as patient is wheeling out to transportation. Simmie Davies RN CM   ? ? ? ?Final next level of care: OP Rehab ?Barriers to Discharge: Barriers Resolved ? ? ?Patient Goals and CMS Choice ?  ?  ?  ? ?Discharge Placement ?  ?           ?  ?  ?  ?  ? ?Discharge Plan and Services ?  ?  ?           ?DME Arranged:  (Patient will get RW from relative as none ordered and WC insurance.) ?DME Agency: NA ?  ?  ?  ?HH Arranged: NA ?[place] Agency: NA ?  ?  ?  ? ?Social Determinants of Health (SDOH) Interventions ?  ? ? ?Readmission Risk Interventions ?   ? View : No data to display.  ?  ?  ?  ? ? ? ? ? ?

## 2021-04-06 ENCOUNTER — Telehealth: Payer: Self-pay

## 2021-04-06 NOTE — Telephone Encounter (Signed)
Transition Care Management Follow-up Telephone Call ?Date of discharge and from where: 04/04/2021 from [facility] ?How have you been since you were released from the hospital? Patient stated that she is feeling okay and did not have any questions or concerns at this time.  ?Any questions or concerns? No ? ?Items Reviewed: ?Did the pt receive and understand the discharge instructions provided? Yes  ?Medications obtained and verified? Yes  ?Other? No  ?Any new allergies since your discharge? No  ?Dietary orders reviewed? No ?Do you have support at home? Yes  ? ?Functional Questionnaire: (I = Independent and D = Dependent) ?ADLs: I ? ?Bathing/Dressing- I ? ?Meal Prep- I ? ?Eating- I ? ?Maintaining continence- I ? ?Transferring/Ambulation- I ? ?Managing Meds- I ? ? ?Follow up appointments reviewed: ? ?PCP Hospital f/u appt confirmed? No   ?Specialist Hospital f/u appt confirmed? No   ?Are transportation arrangements needed? No  ?If their condition worsens, is the pt aware to call PCP or go to the Emergency Dept.? Yes ?Was the patient provided with contact information for the PCP's office or ED? Yes ?Was to pt encouraged to call back with questions or concerns? Yes ? ?

## 2021-04-21 DIAGNOSIS — F331 Major depressive disorder, recurrent, moderate: Secondary | ICD-10-CM | POA: Diagnosis not present

## 2021-04-21 DIAGNOSIS — F411 Generalized anxiety disorder: Secondary | ICD-10-CM | POA: Diagnosis not present

## 2021-05-10 IMAGING — CT CT RENAL STONE PROTOCOL
2 of 4 series · 16 of 46 positions shown, 18 images · non-contrast
Comparison: 10/13/2016

CLINICAL DATA: Right flank pain

EXAM:
CT ABDOMEN AND PELVIS WITHOUT CONTRAST
TECHNIQUE: Multidetector CT imaging of the abdomen and pelvis was performed
following the standard protocol without IV contrast.

[Series 2: stone full standard · axial · 0.81mm/px · z∈[-912,-447]mm · 13 of 103 slices shown, 15 images]
[im 5/103  soft-tissue]
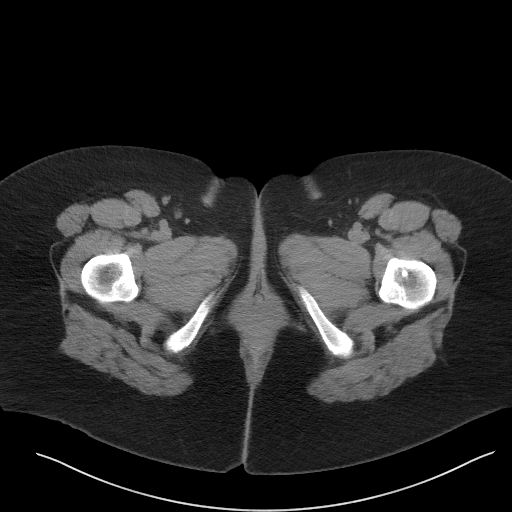
[im 5/103  bone]
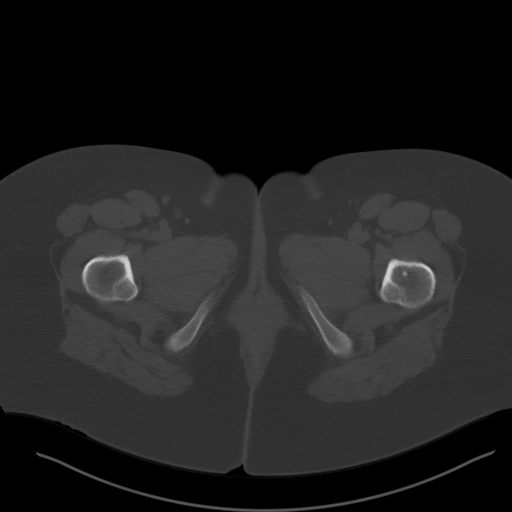
[im 13/103  soft-tissue]
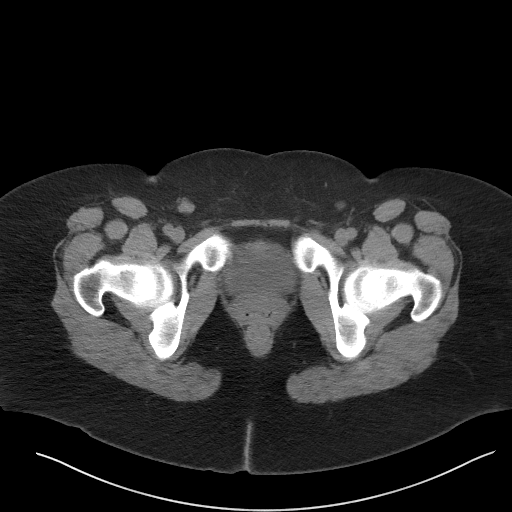
[im 21/103  soft-tissue]
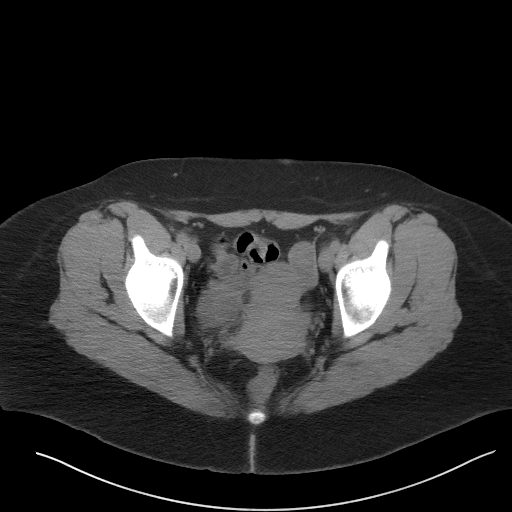
[im 29/103  soft-tissue]
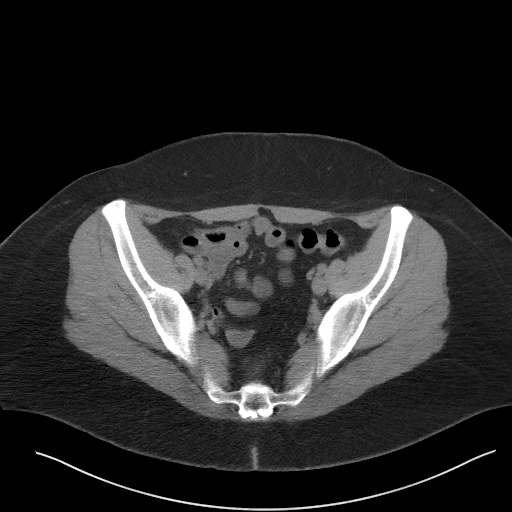
[im 37/103  soft-tissue]
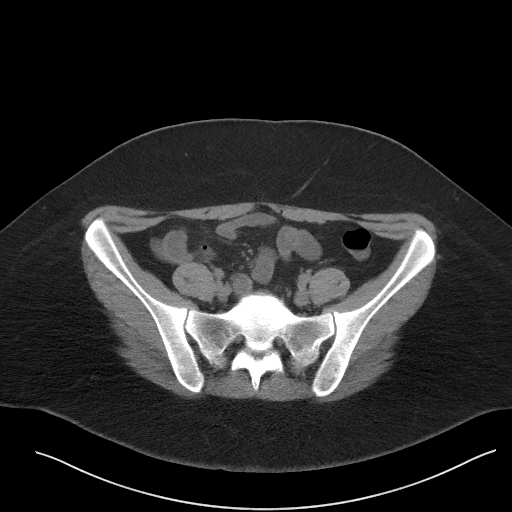
[im 45/103  soft-tissue]
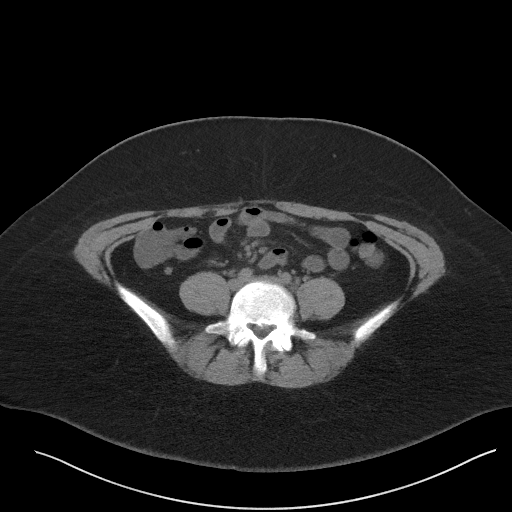
[im 54/103  soft-tissue]
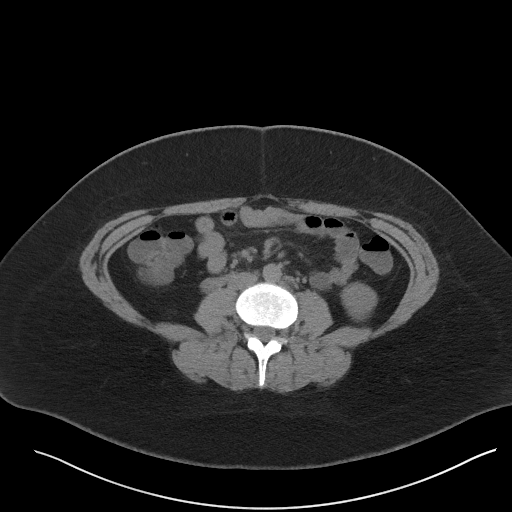
[im 58/103  soft-tissue]
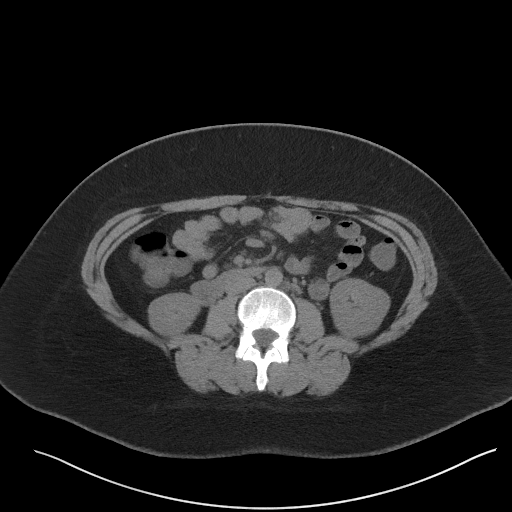
[im 66/103  soft-tissue]
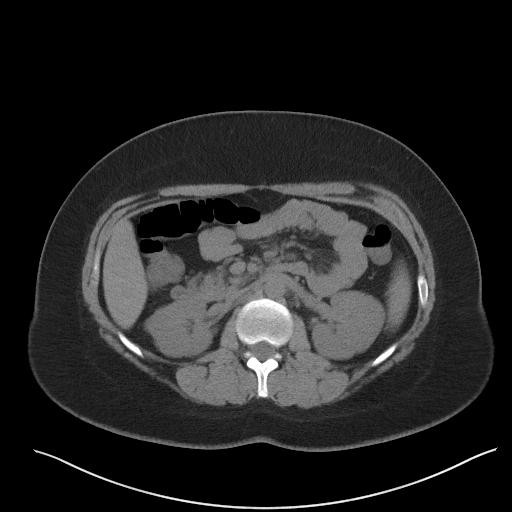
[im 66/103  bone]
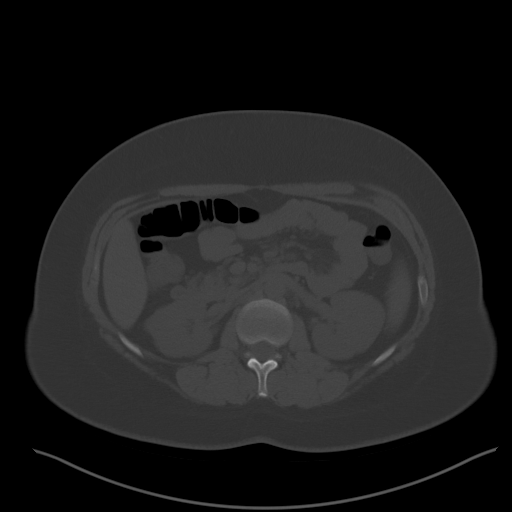
[im 74/103  soft-tissue]
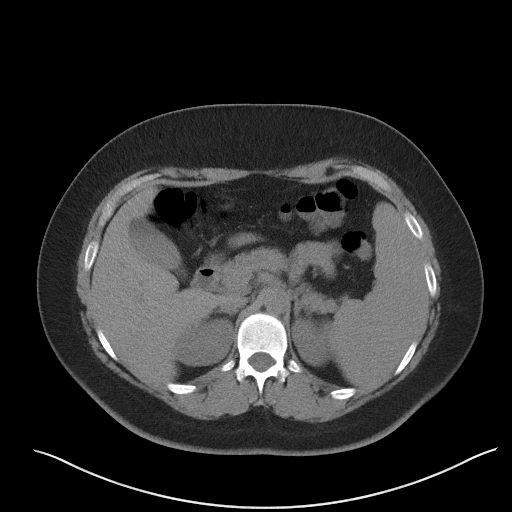
[im 82/103  soft-tissue]
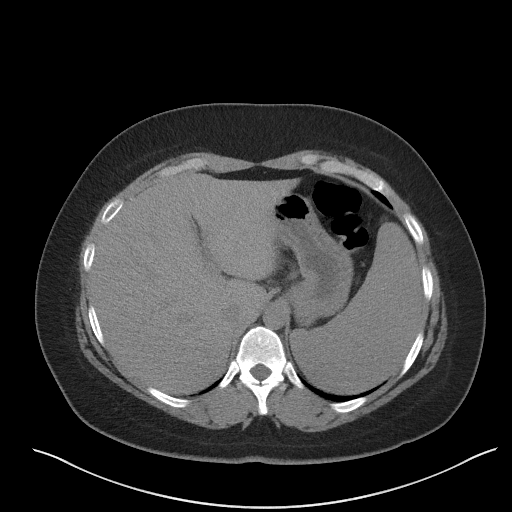
[im 90/103  soft-tissue]
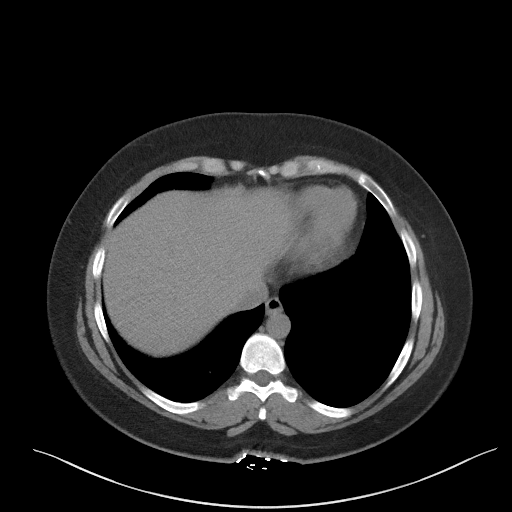
[im 98/103  soft-tissue]
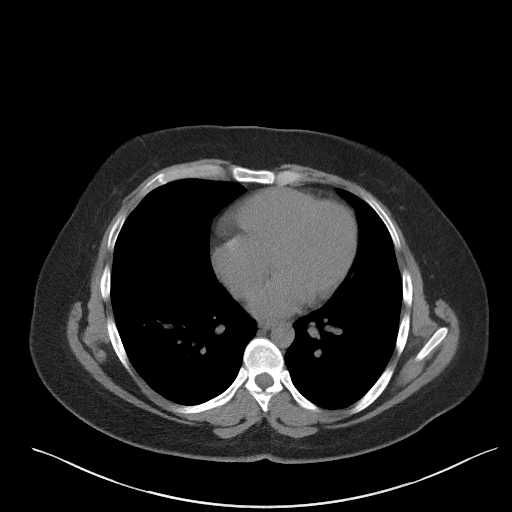

[Series 5: coronal · coronal · 0.78mm/px · 3 of 144 slices shown]
[im 48/144  soft-tissue]
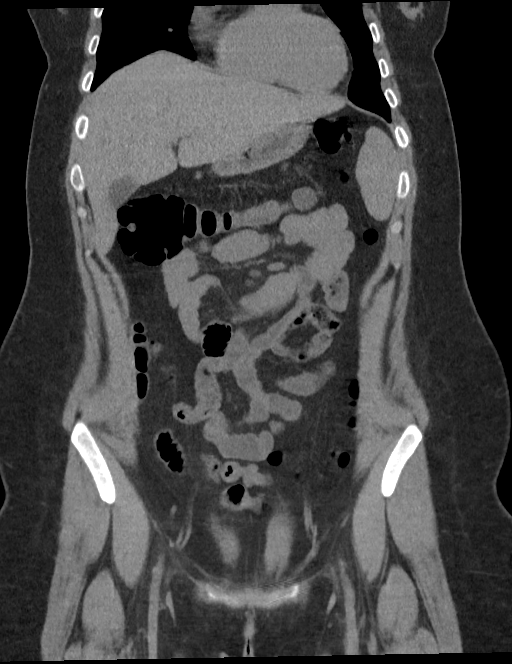
[im 64/144  soft-tissue]
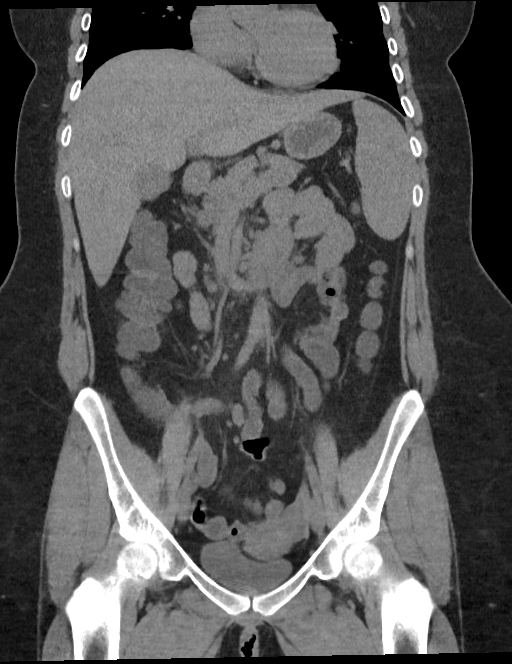
[im 80/144  soft-tissue]
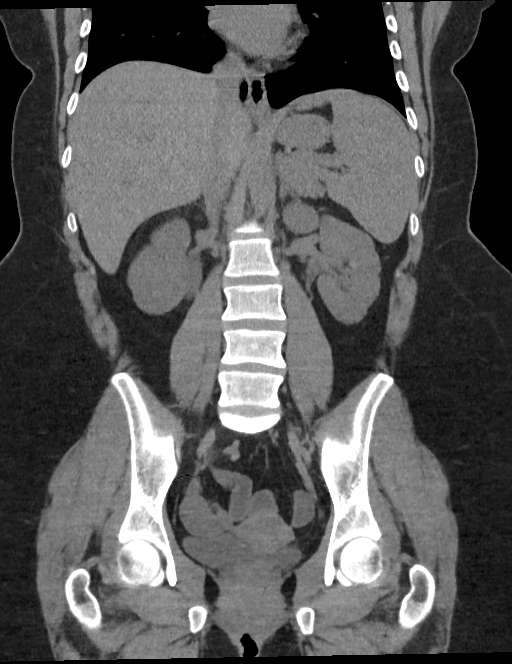

[16 of 46 positions shown; findings below may reference images not displayed]

FINDINGS: Lower chest: The lung bases are clear.

Hepatobiliary: No evidence of focal hepatic lesion on noncontrast
exam. Questionable increased density of gallbladder contents which
may represent intraluminal sludge. No calcified gallstone. No
pericholecystic inflammation. No biliary dilatation.

Pancreas: No ductal dilatation or inflammation.

Spleen: Mild splenomegaly again seen, spleen measures 16 x 6.2 x
11.6 cm (volume = 600 cm^3). No focal abnormality on noncontrast
exam.

Adrenals/Urinary Tract: Normal adrenal glands. No hydronephrosis. No
renal or ureteral calculi. No perinephric edema. Urinary bladder is
partially distended. No bladder stone or wall thickening.

Stomach/Bowel: Stomach is nondistended. No small bowel obstruction.
Scattered fluid-filled small large bowel without wall thickening or
inflammation. Normal appendix.

Vascular/Lymphatic: There increased number of small central
mesenteric nodes, all subcentimeter. Increased number of small
retroperitoneal nodes, all subcentimeter. No pelvic adenopathy.
Abdominal aorta is normal in caliber.

Reproductive: Uterus and bilateral adnexa are unremarkable.

Other: No free air. No free fluid. No evidence of intra-abdominal
fluid collection.

Musculoskeletal: There are no acute or suspicious osseous
abnormalities.
IMPRESSION: 1. No renal stones or obstructive uropathy. No acute findings in the
abdomen/pelvis.
2. Increased number of small central mesenteric and retroperitoneal
nodes, unchanged from 2197 exam and likely reactive.
3. Mild splenomegaly, unchanged.
4. Possible gallbladder sludge.  No gallbladder inflammation by CT.

## 2021-05-28 ENCOUNTER — Encounter: Payer: Self-pay | Admitting: *Deleted

## 2021-05-28 ENCOUNTER — Encounter: Payer: Self-pay | Admitting: Family Medicine

## 2021-06-04 ENCOUNTER — Encounter: Payer: Self-pay | Admitting: Family Medicine

## 2021-06-04 ENCOUNTER — Ambulatory Visit
Admission: RE | Admit: 2021-06-04 | Discharge: 2021-06-04 | Disposition: A | Discharge status: Home / Self Care | Payer: Managed Care, Other (non HMO) | Source: Ambulatory Visit | Attending: Family Medicine | Admitting: Family Medicine

## 2021-06-04 DIAGNOSIS — N83201 Unspecified ovarian cyst, right side: Secondary | ICD-10-CM | POA: Diagnosis not present

## 2021-06-04 DIAGNOSIS — N83299 Other ovarian cyst, unspecified side: Secondary | ICD-10-CM | POA: Diagnosis not present

## 2021-06-17 DIAGNOSIS — F331 Major depressive disorder, recurrent, moderate: Secondary | ICD-10-CM | POA: Diagnosis not present

## 2021-06-17 DIAGNOSIS — F411 Generalized anxiety disorder: Secondary | ICD-10-CM | POA: Diagnosis not present

## 2021-07-21 ENCOUNTER — Ambulatory Visit (INDEPENDENT_AMBULATORY_CARE_PROVIDER_SITE_OTHER): Payer: Managed Care, Other (non HMO) | Admitting: Family Medicine

## 2021-07-21 ENCOUNTER — Encounter: Payer: Self-pay | Admitting: Family Medicine

## 2021-07-21 VITALS — BP 100/68 | HR 80 | Temp 98.7°F | Ht 65.0 in | Wt 280.0 lb

## 2021-07-21 DIAGNOSIS — M5126 Other intervertebral disc displacement, lumbar region: Secondary | ICD-10-CM

## 2021-07-21 MED ORDER — PREGABALIN 50 MG PO CAPS: 50.0000 mg | ORAL_CAPSULE | Freq: Two times a day (BID) | ORAL | 1 refills | Status: AC

## 2021-07-21 NOTE — Patient Instructions (Signed)
Start lyrica once a day at bedtime.. if tolerating after 1 week increase to 1 tab twice daily.  We will work on referral to PT.   Can  use ibuprofen 800 mg three times daily as needed.  Follow up in 2-3 months if pain is not  improving.

## 2021-07-21 NOTE — Progress Notes (Signed)
Patient ID: Amy Cabrera, female    DOB: November 24, 1995, 26 y.o.   MRN: [id number]  This visit was conducted in person.  BP 100/68   Pulse 80   Temp 98.7 F (37.1 C) (Oral)   Ht 5\' 5"  (1.651 m)   Wt 280 lb (127 kg)   SpO2 98%   BMI 46.59 kg/m    CC:  Chief Complaint  Patient presents with   Back Pain    Bulging Disc-Happened at work and was denied workers-comp Still having a lot of back pain    Subjective:   HPI: Amy Cabrera is a 26 y.o. female presenting on 07/21/2021 for Back Pain (Bulging Disc-Happened at work and was denied workers-comp/Still having a lot of back pain)  She has history of chronic low back pain but in 04/02/2021 she was admitted for back injury.. when squatting down to get a box.. had severe pain following. Reviewed notes in detail.  MRI L4-L5 right paracentral disc protrusion with potential impingement Treated with lyrica for  neuropathic pain     Never had follow up with Dr. 04/04/2021 or PT as workers comp denied the claim.   She has been using ibuprofen for pain 800 mg  2-3 times daily... helps some.   She has daily pain.. pain 6-7/10 on pain scale, sitting  3/10 on pain scale  Pain is worse with standing > 5010 min. Has to sit every 10 min.  Cannot bend without pain.  She cannot lift > 10 lbs without pain.  Radiated down left leg to toes, across low back.   Mild improvement  with lyrica.12-10-1988 stopped taking given ran out. No SE.   Relevant past medical, surgical, family and social history reviewed and updated as indicated. Interim medical history since our last visit reviewed. Allergies and medications reviewed and updated. Outpatient Medications Prior to Visit  Medication Sig Dispense Refill   hydrOXYzine (VISTARIL) 25 MG capsule Take 1 capsule (25 mg total) by mouth 3 (three) times daily as needed for anxiety. 60 capsule 0   norethindrone (MICRONOR) 0.35 MG tablet Take 1 tablet by mouth daily.     sertraline (ZOLOFT) 50 MG tablet Take 1  tablet (50 mg total) by mouth daily. 30 tablet 0   ibuprofen (ADVIL) 400 MG tablet Take 1 tablet (400 mg total) by mouth 4 (four) times daily -  with meals and at bedtime. Take scheduled for 3-4 days with meals and thereafter as needed 30 tablet 0   methylPREDNISolone (MEDROL DOSEPAK) 4 MG TBPK tablet Take as directed 21 each 0   pregabalin (LYRICA) 50 MG capsule Take 1 capsule (50 mg total) by mouth daily. 30 capsule 0   No facility-administered medications prior to visit.     Per HPI unless specifically indicated in ROS section below Review of Systems  Constitutional:  Negative for fatigue and fever.  HENT:  Negative for congestion.   Eyes:  Negative for pain.  Respiratory:  Negative for cough and shortness of breath.   Cardiovascular:  Negative for chest pain, palpitations and leg swelling.  Gastrointestinal:  Negative for abdominal pain.  Genitourinary:  Negative for dysuria and vaginal bleeding.  Musculoskeletal:  Positive for back pain.  Neurological:  Negative for syncope, light-headedness and headaches.  Psychiatric/Behavioral:  Negative for dysphoric mood.    Objective:  BP 100/68   Pulse 80   Temp 98.7 F (37.1 C) (Oral)   Ht 5\' 5"  (1.651 m)   Wt 280 lb (127  kg)   SpO2 98%   BMI 46.59 kg/m   Wt Readings from Last 3 Encounters:  07/21/21 280 lb (127 kg)  04/02/21 270 lb (122.5 kg)  11/28/20 242 lb 6 oz (109.9 kg)      Physical Exam Constitutional:      General: She is not in acute distress.    Appearance: Normal appearance. She is well-developed. She is not ill-appearing or toxic-appearing.  HENT:     Head: Normocephalic.     Right Ear: Hearing, tympanic membrane, ear canal and external ear normal. Tympanic membrane is not erythematous, retracted or bulging.     Left Ear: Hearing, tympanic membrane, ear canal and external ear normal. Tympanic membrane is not erythematous, retracted or bulging.     Nose: No mucosal edema or rhinorrhea.     Right Sinus: No  maxillary sinus tenderness or frontal sinus tenderness.     Left Sinus: No maxillary sinus tenderness or frontal sinus tenderness.     Mouth/Throat:     Pharynx: Uvula midline.  Eyes:     General: Lids are normal. Lids are everted, no foreign bodies appreciated.     Conjunctiva/sclera: Conjunctivae normal.     Pupils: Pupils are equal, round, and reactive to light.  Neck:     Thyroid: No thyroid mass or thyromegaly.     Vascular: No carotid bruit.     Trachea: Trachea normal.  Cardiovascular:     Rate and Rhythm: Normal rate and regular rhythm.     Pulses: Normal pulses.     Heart sounds: Normal heart sounds, S1 normal and S2 normal. No murmur heard.    No friction rub. No gallop.  Pulmonary:     Effort: Pulmonary effort is normal. No tachypnea or respiratory distress.     Breath sounds: Normal breath sounds. No decreased breath sounds, wheezing, rhonchi or rales.  Abdominal:     General: Bowel sounds are normal.     Palpations: Abdomen is soft.     Tenderness: There is no abdominal tenderness.  Musculoskeletal:     Cervical back: Normal, normal range of motion and neck supple.     Thoracic back: Normal.     Lumbar back: Tenderness present. Decreased range of motion. Negative right straight leg raise test and negative left straight leg raise test.  Skin:    General: Skin is warm and dry.     Findings: No rash.  Neurological:     Mental Status: She is alert.  Psychiatric:        Mood and Affect: Mood is not anxious or depressed.        Speech: Speech normal.        Behavior: Behavior normal. Behavior is cooperative.        Thought Content: Thought content normal.        Judgment: Judgment normal.       Results for orders placed or performed during the hospital encounter of 04/02/21  Comprehensive metabolic panel  Result Value Ref Range   Sodium 137 135 - 145 mmol/L   Potassium 3.7 3.5 - 5.1 mmol/L   Chloride 108 98 - 111 mmol/L   CO2 20 (L) 22 - 32 mmol/L   Glucose,  Bld 185 (H) 70 - 99 mg/dL   BUN 12 6 - 20 mg/dL   Creatinine, Ser 1.75 0.44 - 1.00 mg/dL   Calcium 9.4 8.9 - 10.2 mg/dL   Total Protein 7.4 6.5 - 8.1 g/dL   Albumin 4.0 3.5 -  5.0 g/dL   AST 42 (H) 15 - 41 U/L   ALT 30 0 - 44 U/L   Alkaline Phosphatase 87 38 - 126 U/L   Total Bilirubin 0.7 0.3 - 1.2 mg/dL   GFR, Estimated >21 >19 mL/min   Anion gap 9 5 - 15  CBC with Differential  Result Value Ref Range   WBC 10.5 4.0 - 10.5 K/uL   RBC 4.51 3.87 - 5.11 MIL/uL   Hemoglobin 12.1 12.0 - 15.0 g/dL   HCT 41.7 40.8 - 14.4 %   MCV 82.0 80.0 - 100.0 fL   MCH 26.8 26.0 - 34.0 pg   MCHC 32.7 30.0 - 36.0 g/dL   RDW 81.8 56.3 - 14.9 %   Platelets 183 150 - 400 K/uL   nRBC 0.0 0.0 - 0.2 %   Neutrophils Relative % 89 %   Neutro Abs 9.3 (H) 1.7 - 7.7 K/uL   Lymphocytes Relative 8 %   Lymphs Abs 0.8 0.7 - 4.0 K/uL   Monocytes Relative 2 %   Monocytes Absolute 0.2 0.1 - 1.0 K/uL   Eosinophils Relative 0 %   Eosinophils Absolute 0.0 0.0 - 0.5 K/uL   Basophils Relative 0 %   Basophils Absolute 0.0 0.0 - 0.1 K/uL   Immature Granulocytes 1 %   Abs Immature Granulocytes 0.05 0.00 - 0.07 K/uL  HIV Antibody (routine testing w rflx)  Result Value Ref Range   HIV Screen 4th Generation wRfx Non Reactive Non Reactive  Basic metabolic panel  Result Value Ref Range   Sodium 137 135 - 145 mmol/L   Potassium 3.7 3.5 - 5.1 mmol/L   Chloride 108 98 - 111 mmol/L   CO2 20 (L) 22 - 32 mmol/L   Glucose, Bld 178 (H) 70 - 99 mg/dL   BUN 16 6 - 20 mg/dL   Creatinine, Ser 7.02 0.44 - 1.00 mg/dL   Calcium 9.4 8.9 - 63.7 mg/dL   GFR, Estimated >85 >88 mL/min   Anion gap 9 5 - 15  CBC  Result Value Ref Range   WBC 10.0 4.0 - 10.5 K/uL   RBC 4.71 3.87 - 5.11 MIL/uL   Hemoglobin 12.7 12.0 - 15.0 g/dL   HCT 50.2 77.4 - 12.8 %   MCV 82.2 80.0 - 100.0 fL   MCH 27.0 26.0 - 34.0 pg   MCHC 32.8 30.0 - 36.0 g/dL   RDW 78.6 76.7 - 20.9 %   Platelets 205 150 - 400 K/uL   nRBC 0.0 0.0 - 0.2 %     COVID 19  screen:  No recent travel or known exposure to COVID19 The patient denies respiratory symptoms of COVID 19 at this time. The importance of social distancing was discussed today.   Assessment and Plan     Kerby Nora, MD

## 2021-07-31 ENCOUNTER — Encounter: Payer: Self-pay | Admitting: Family Medicine

## 2021-07-31 NOTE — Telephone Encounter (Signed)
Amy Dire,  Do you know anything about paperwork for her work?

## 2021-07-31 NOTE — Telephone Encounter (Signed)
Forms printed and placed in Dr. Daphine Deutscher in box to complete the rest of section 10.

## 2021-08-04 NOTE — Telephone Encounter (Signed)
Updated forms faxed to [employer] at [phone].

## 2021-08-11 ENCOUNTER — Ambulatory Visit: Payer: Managed Care, Other (non HMO) | Attending: Family Medicine

## 2021-08-11 DIAGNOSIS — M5126 Other intervertebral disc displacement, lumbar region: Secondary | ICD-10-CM | POA: Insufficient documentation

## 2021-08-11 DIAGNOSIS — M6281 Muscle weakness (generalized): Secondary | ICD-10-CM | POA: Diagnosis not present

## 2021-08-11 DIAGNOSIS — M5432 Sciatica, left side: Secondary | ICD-10-CM | POA: Diagnosis not present

## 2021-08-11 DIAGNOSIS — M5459 Other low back pain: Secondary | ICD-10-CM | POA: Insufficient documentation

## 2021-08-11 DIAGNOSIS — M5431 Sciatica, right side: Secondary | ICD-10-CM | POA: Insufficient documentation

## 2021-08-11 NOTE — Therapy (Signed)
[facility] [address], [place], [address] Phone: [phone]   Fax:  [phone]  Physical Therapy Evaluation  Patient Details  Name: Amy Cabrera MRN: [id number] Date of Birth: 01/18/1995 Referring Provider (PT): Excell Seltzer, MD   Encounter Date: 08/11/2021   PT End of Session - 08/11/21 1457     Visit Number 1    Number of Visits 17    Date for PT Re-Evaluation 10/08/21    PT Start Time 1458    PT Stop Time 1547    PT Time Calculation (min) 49 min    Activity Tolerance Patient tolerated treatment well    Behavior During Therapy [facility] for tasks assessed/performed             Past Medical History:  Diagnosis Date   Anxiety    Back pain    Medical history non-contributory    Post partum depression    post partum    Past Surgical History:  Procedure Laterality Date   CESAREAN SECTION N/A 08/13/2017   Procedure: CESAREAN SECTION;  Surgeon: Hildred Laser, MD;  Location: [facility] ORS;  Service: Obstetrics;  Laterality: N/A;   I & D EXTREMITY Right 12/20/2018   Procedure: RIGHT FOOT DEBRIDEMENT;  Surgeon: Nadara Mustard, MD;  Location: [facility] OR;  Service: Orthopedics;  Laterality: Right;   WISDOM TOOTH EXTRACTION      There were no vitals filed for this visit.    Subjective Assessment - 08/11/21 1502     Subjective Low back: 3/10 currently (pt sitting), 11/10 at worst for the past 3 monts. B LE: 1/10 posterior thighs to the knees currently, 11/10 at worst for the past 3 months.    Pertinent History Herniated intervertebral disc of lumbar spine. Pain is located in central low back. Has 2 bulging disc. Has been having back problems since she was 26 years old. Has been doing pretty good for the past 2 years. However on April 02, 2021 pt was squatting down to pick up a light box at work and pt felt her low back pain. Feels B sciatic symptoms (R LE goes to calf, L LE goes to toes laterally/L5 dermatome).  Pt works at [employer] and reports. Denies loss of bowel or bladder function. Denies loss of bowel or bladder function or saddle anesthesia.    Patient Stated Goals Be able to bend over and pick up her 42 year old daughter.    Currently in Pain? Yes    Pain Score 3     Pain Location Back    Pain Orientation Posterior;Lower    Pain Descriptors / Indicators Sharp;Tingling   B LE: tingling   Pain Type Chronic pain    Pain Radiating Towards B Sciatic nerve L > R    Pain Onset More than a month ago    Pain Frequency Occasional    Aggravating Factors  Standing for more than 15 minutes, turning to the R or L too quickly, bending over, laying on her side without pillow between knees.    Pain Relieving Factors ibuprofen, standing up straight (if aggravated by bending over)                [facility] PT Assessment - 08/11/21 1515       Assessment   Medical Diagnosis M51.26 (ICD-10-CM) - Herniated intervertebral disc of lumbar spine    Referring Provider (PT) Excell Seltzer, MD    Onset Date/Surgical  Date 04/02/21    Prior Therapy No known PT for current condition      Precautions   Precaution Comments No known precautions      Restrictions   Other Position/Activity Restrictions No known restrictions      Observation/Other Assessments   Focus on Therapeutic Outcomes (FOTO)  Lumbar Spine FOTO 49      Posture/Postural Control   Posture Comments forward neck, L shoulder, R lateral shift, B foot pronation      AROM   Lumbar Flexion Limited with reproduction of low back pain    Lumbar Extension WFL, no pain    Lumbar - Right Side Bend WFL with R low back pain    Lumbar - Left Side Bend WFL with L low back pain with overpressure    Lumbar - Right Rotation WFL    Lumbar - Left Rotation WFL with L lateral trunk tightness with overpressure      Strength   Right Hip Flexion 4-/5    Right Hip Extension 3/5    Right Hip ABduction 4/5    Left Hip Flexion 4-/5    Left Hip  Extension 3+/5    Left Hip ABduction 4-/5    Right Knee Flexion 5/5    Right Knee Extension 5/5    Left Knee Flexion 5/5    Left Knee Extension 5/5   with L posterior hip and L5 dermatome pain not past knee.     Palpation   Palpation comment TTP L posterior hip at piriformis area, slight TTP R posterior hip. TTP L sacral alam, L5 TP, L4 TP > R side for respective levels.      Special Tests   Other special tests (+) Slump L and R LE; (+) piriformis test L      Ambulation/Gait   Gait Comments Decreased stance L LE, L pelvic drop during R LE stance phase                        Objective measurements completed on examination: See above findings.   No latex allergies        Extension preference  Therapeutic exercise   Prone on elbows 2 minutes   seated piriformis stretch 30 seconds x 2  Improved exercise technique, movement at target joints, use of target muscles after mod verbal, visual, tactile cues.    Response to treatment Pt tolerated session well without aggravation of symptoms.    Clinical impression Pt is a 26 year old female who came to physical therapy secondary to low back pain with B LE radiating symptoms. She also presents with altered posture, decreased trunk and B hip strength, positive special tests suggesting LE neural tension as well as L piriformis involvement, reproduction of symptoms with lumbar flexion as well as side bending; lumbar extension directional preference, and difficulty performing tasks which involve bending over, prolonged standing, turning too quickly, and side lying secondary to pain.  Pt will benefit from skilled physical therapy services to address the aforementioned deficits.          Access Code: [email or web address] URL: [email or web address] Date: 08/11/2021 Prepared by: Joneen Boers  Exercises - Seated Piriformis Stretch  - 3 x daily - 7 x weekly - 1 sets - 3 reps - 30 seconds hold - Static Prone on  Elbows  - 2 x daily - 7 x weekly - 3 sets - 2 reps - 2 minutes hold  PT Education - 08/11/21 1840     Education Details ther-ex, HEP, POC    Person(s) Educated Patient    Methods Explanation;Demonstration;Tactile cues;Verbal cues;Handout    Comprehension Verbalized understanding;Returned demonstration              PT Short Term Goals - 08/11/21 1831       PT SHORT TERM GOAL #1   Title Patient will be independent with her initial HEP to improve strength, function, and decrease pain.    Baseline Pt has started her initial HEP (08/11/2021)    Time 3    Period Weeks    Status New    Target Date 09/03/21               PT Long Term Goals - 08/11/21 1832       PT LONG TERM GOAL #1   Title Pt will have a decrease in low back pain to 4/10 or less at worst to promote ability to perform activities which involve bending over, as well as prolonged standing more comfortably.    Baseline 11/10 low back pain at worst for the past 3 months (08/11/2021)    Time 8    Period Weeks    Status New    Target Date 10/08/21      PT LONG TERM GOAL #2   Title Patient will have a decrease in B LE pain to 4/10 or less at worst to promote ability to perform activities which involve bending over as well as prolonged standing as well as tolerate side lying positions more comfortably.    Baseline 11/10 B LE pain at worst for the past 3 months (8/1/20223)    Time 8    Period Weeks    Status New    Target Date 10/08/21      PT LONG TERM GOAL #3   Title Patient will improve bilateral hip extension and abduction strength by at least 1/2 MMT grade to promote ability to perform standing tasks more comfortably.    Baseline Hip extension 3/5 R, 3+/5 L, hip abduction 4/5 R, 4-/5 L (08/11/2021)    Time 8    Period Weeks    Status New    Target Date 10/08/21      PT LONG TERM GOAL #4   Title Patient will improve her lumbar spine FOTO score by at least 10 points as a demonstration of  improved function.    Baseline Lumbar spine FOTO 49 (08/11/2021)    Time 8    Period Weeks    Status New    Target Date 10/08/21                    Plan - 08/11/21 1826     Clinical Impression Statement Pt is a 26 year old female who came to physical therapy secondary to low back pain with B LE radiating symptoms. She also presents with altered posture, decreased trunk and B hip strength, positive special tests suggesting LE neural tension as well as L piriformis involvement, reproduction of symptoms with lumbar flexion as well as side bending; lumbar extension directional preference, and difficulty performing tasks which involve bending over, prolonged standing, turning too quickly, and side lying secondary to pain.  Pt will benefit from skilled physical therapy services to address the aforementioned deficits.    Personal Factors and Comorbidities Comorbidity 3+;Time since onset of injury/illness/exacerbation;Fitness    Comorbidities Anxiety, post partum depression, hx of cesarean section    Examination-Activity  Limitations Stand;Bend;Lift    Stability/Clinical Decision Making Evolving/Moderate complexity   Pain seems to be worsening based on subjective reports: 11/10 back pain at most for the past 3 months which is after recent onset April 02, 2021.   Clinical Decision Making Moderate    Rehab Potential Fair    PT Frequency 2x / week    PT Duration 8 weeks    PT Treatment/Interventions Therapeutic exercise;Therapeutic activities;Neuromuscular re-education;Patient/family education;Manual techniques;Dry needling;Spinal Manipulations;Joint Manipulations;Aquatic Therapy;Electrical Stimulation;Iontophoresis 4mg /ml Dexamethasone;Traction    PT Next Visit Plan Posture, lumbar extension, trunk and glute strengthening, lumbopelvic control, manual techniques, modalities PRN    PT Home Exercise Plan Medbridge Access Code: [email or web address]    Consulted and Agree with Plan of Care Patient              Patient will benefit from skilled therapeutic intervention in order to improve the following deficits and impairments:  Pain, Postural dysfunction, Improper body mechanics, Decreased strength  Visit Diagnosis: Other low back pain - Plan: PT plan of care cert/re-cert  Sciatica, left side - Plan: PT plan of care cert/re-cert  Sciatica, right side - Plan: PT plan of care cert/re-cert  Muscle weakness (generalized) - Plan: PT plan of care cert/re-cert     Problem List Patient Active Problem List   Diagnosis Date Noted   Inadequate pain control 04/02/2021   Anxiety 04/02/2021   Engages in vaping 04/02/2021   DUB (dysfunctional uterine bleeding) 11/28/2020   Lower abdominal pain 11/28/2020   Acute non-recurrent maxillary sinusitis 10/21/2020   Cutaneous abscess of right foot    Postpartum depression 10/12/2017   Family history of breast cancer 03/03/2017   Family history of type 2 diabetes mellitus 03/03/2017   Allergic rhinitis 10/01/2014   Low back pain 07/02/2014   PES PLANUS 12/23/2009    Joneen Boers PT, DPT  08/11/2021, 6:46 PM  [facility] [address], [place], [address] Phone: [phone]   Fax:  [phone]  Name: Amy Cabrera MRN: [id number] Date of Birth: 1995/04/14

## 2021-08-17 ENCOUNTER — Telehealth: Payer: Self-pay

## 2021-08-17 ENCOUNTER — Ambulatory Visit: Payer: Managed Care, Other (non HMO)

## 2021-08-17 NOTE — Telephone Encounter (Signed)
No show. Called patient who said that she forgot. Took a nap and just woke up. Will be able to make it to her next appointment.

## 2021-08-23 DIAGNOSIS — M5126 Other intervertebral disc displacement, lumbar region: Secondary | ICD-10-CM | POA: Insufficient documentation

## 2021-08-23 NOTE — Assessment & Plan Note (Signed)
Start lyrica once a day at bedtime.. if tolerating after 1 week increase to 1 tab twice daily.  We will work on referral to PT.   Can  use ibuprofen 800 mg three times daily as needed.  Follow up in 2-3 months if pain is not  improving.

## 2021-08-25 ENCOUNTER — Ambulatory Visit: Payer: Managed Care, Other (non HMO)

## 2021-08-25 DIAGNOSIS — M5459 Other low back pain: Secondary | ICD-10-CM

## 2021-08-25 DIAGNOSIS — M5432 Sciatica, left side: Secondary | ICD-10-CM

## 2021-08-25 DIAGNOSIS — M6281 Muscle weakness (generalized): Secondary | ICD-10-CM

## 2021-08-25 DIAGNOSIS — M5126 Other intervertebral disc displacement, lumbar region: Secondary | ICD-10-CM | POA: Diagnosis not present

## 2021-08-25 DIAGNOSIS — M5431 Sciatica, right side: Secondary | ICD-10-CM

## 2021-08-25 NOTE — Therapy (Signed)
[facility] PHYS[facility] AND SP[facility] MEDICINE [address], [place], [address] Phone: [phone]   Fax:  [phone]  Physical Therapy Treatment  Patient Details  Name: Amy Cabrera MRN: [id number] Date of Birth: 08-23-1995 Referring Provider (PT): Excell Seltzer, MD   Encounter Date: 08/25/2021   PT End of Session - 08/25/21 1509     Visit Number 2    Number of Visits 17    Date for PT Re-Evaluation 10/08/21    PT Start Time 1508    PT Stop Time 1546    PT Time Calculation (min) 38 min    Activity Tolerance Patient tolerated treatment well    Behavior During Therapy [facility] for tasks assessed/performed             Past Medical History:  Diagnosis Date   Anxiety    Back pain    Medical history non-contributory    Post partum depression    post partum    Past Surgical History:  Procedure Laterality Date   CESAREAN SECTION N/A 08/13/2017   Procedure: CESAREAN SECTION;  Surgeon: Hildred Laser, MD;  Location: [facility] ORS;  Service: Obstetrics;  Laterality: N/A;   I & D EXTREMITY Right 12/20/2018   Procedure: RIGHT FOOT DEBRIDEMENT;  Surgeon: Nadara Mustard, MD;  Location: [facility] OR;  Service: Orthopedics;  Laterality: Right;   WISDOM TOOTH EXTRACTION      There were no vitals filed for this visit.  Objective measurements completed on examination: See above findings.   No latex allergies     Extension preference  Therapeutic exercise  08/25/21   Prone press ups on elbows: 2x 12 reps   Seated piriformis stretch1x30 sec/LE  NEURAL GLIDES:  Seated flossing techniques: 2x12/LE. PT demo prior to completion   glut bridges:  3x6, 1-2 sec holds at end range.   SKTC: 2x1 minute holds. Reports improvement in LLE symptoms. No relief on R side   Figure 4 stretch supine: Significant relief RLE and limited relief on LLE. 2x1 minute   Updated HEP. Provided handout due to improved relief. Educaiton provided on reps, sets,  frequency.  Response to treatment Pt tolerated session well without aggravation of symptoms.    Clinical impression Pt arriving for f/u after eval. Reports minimal relief from HEP. Focus of session on finding exercises that give pt relief. Notable relief in hook lying figure 4 on RLE and single knee to chest on LLE. Updated HEP accordingly. Reports 4/10 pain in BLE's post session. Pt will continue to benefit from skilled PT services to reduce pain to return to pain free ADL and job completion tasks.    Updated 08/25/21 Access Code: [email or web address] URL: [email or web address] Date: 08/25/2021 Prepared by: Ronnie Derby  Exercises - Hooklying Single Knee to Chest  - 1 x daily - 7 x weekly - 2 sets - 3 reps - 1 min hold - Supine Piriformis Stretch with Foot on Ground  - 1 x daily - 7 x weekly - 2 sets - 3 reps - 1 min hold - Prone Press Up On Elbows  - 1 x daily - 7 x weekly - 3 sets - 20 reps    Access Code: [email or web address] URL: [email or web address] Date: 08/11/2021 Prepared by: Loralyn Freshwater  Exercises - Seated Piriformis Stretch  - 3 x daily - 7 x weekly - 1 sets - 3 reps - 30 seconds hold - Static Prone on Elbows  - 2 x daily - 7 x weekly -  3 sets - 2 reps - 2 minutes hold              PT Short Term Goals - 08/11/21 1831       PT SHORT TERM GOAL #1   Title Patient will be independent with her initial HEP to improve strength, function, and decrease pain.    Baseline Pt has started her initial HEP (08/11/2021)    Time 3    Period Weeks    Status New    Target Date 09/03/21               PT Long Term Goals - 08/11/21 1832       PT LONG TERM GOAL #1   Title Pt will have a decrease in low back pain to 4/10 or less at worst to promote ability to perform activities which involve bending over, as well as prolonged standing more comfortably.    Baseline 11/10 low back pain at worst for the past 3 months (08/11/2021)    Time 8    Period Weeks     Status New    Target Date 10/08/21      PT LONG TERM GOAL #2   Title Patient will have a decrease in B LE pain to 4/10 or less at worst to promote ability to perform activities which involve bending over as well as prolonged standing as well as tolerate side lying positions more comfortably.    Baseline 11/10 B LE pain at worst for the past 3 months (8/1/20223)    Time 8    Period Weeks    Status New    Target Date 10/08/21      PT LONG TERM GOAL #3   Title Patient will improve bilateral hip extension and abduction strength by at least 1/2 MMT grade to promote ability to perform standing tasks more comfortably.    Baseline Hip extension 3/5 R, 3+/5 L, hip abduction 4/5 R, 4-/5 L (08/11/2021)    Time 8    Period Weeks    Status New    Target Date 10/08/21      PT LONG TERM GOAL #4   Title Patient will improve her lumbar spine FOTO score by at least 10 points as a demonstration of improved function.    Baseline Lumbar spine FOTO 49 (08/11/2021)    Time 8    Period Weeks    Status New    Target Date 10/08/21                      Patient will benefit from skilled therapeutic intervention in order to improve the following deficits and impairments:     Visit Diagnosis: Other low back pain  Sciatica, left side  Sciatica, right side  Muscle weakness (generalized)     Problem List Patient Active Problem List   Diagnosis Date Noted   Herniated intervertebral disc of lumbar spine 08/23/2021   Inadequate pain control 04/02/2021   Anxiety 04/02/2021   Engages in vaping 04/02/2021   DUB (dysfunctional uterine bleeding) 11/28/2020   Lower abdominal pain 11/28/2020   Acute non-recurrent maxillary sinusitis 10/21/2020   Cutaneous abscess of right foot    Postpartum depression 10/12/2017   Family history of breast cancer 03/03/2017   Family history of type 2 diabetes mellitus 03/03/2017   Allergic rhinitis 10/01/2014   Low back pain 07/02/2014   PES PLANUS 12/23/2009      Delphia Grates. Fairly IV, PT, DPT Physical Therapist-  [facility]  08/25/2021, 3:55 PM  [place] [facility]ICINE [address], [place], [address] Phone: [phone]   Fax:  [phone]  Name: Amy Cabrera MRN: [id number] Date of Birth: 09-17-1995

## 2021-08-27 ENCOUNTER — Ambulatory Visit: Payer: Managed Care, Other (non HMO)

## 2021-08-27 DIAGNOSIS — M5432 Sciatica, left side: Secondary | ICD-10-CM

## 2021-08-27 DIAGNOSIS — M5459 Other low back pain: Secondary | ICD-10-CM | POA: Diagnosis not present

## 2021-08-27 DIAGNOSIS — M5431 Sciatica, right side: Secondary | ICD-10-CM | POA: Diagnosis not present

## 2021-08-27 DIAGNOSIS — M5126 Other intervertebral disc displacement, lumbar region: Secondary | ICD-10-CM | POA: Diagnosis not present

## 2021-08-27 DIAGNOSIS — M6281 Muscle weakness (generalized): Secondary | ICD-10-CM

## 2021-08-27 NOTE — Therapy (Signed)
[facility] [address], [place], [address] Phone: [phone]   Fax:  [phone]  Physical Therapy Treatment  Patient Details  Name: Amy Cabrera MRN: [id number] Date of Birth: [birth year] Referring Provider (PT): Excell Seltzer, MD   Encounter Date: 08/27/2021   PT End of Session - 08/27/21 1420     Visit Number 3    Number of Visits 17    Date for PT Re-Evaluation 10/08/21    PT Start Time 1416    PT Stop Time 1500    PT Time Calculation (min) 44 min    Activity Tolerance Patient tolerated treatment well    Behavior During Therapy [facility] for tasks assessed/performed             Past Medical History:  Diagnosis Date   Anxiety    Back pain    Medical history non-contributory    Post partum depression    post partum    Past Surgical History:  Procedure Laterality Date   CESAREAN SECTION N/A 08/13/2017   Procedure: CESAREAN SECTION;  Surgeon: Hildred Laser, MD;  Location: [facility] ORS;  Service: Obstetrics;  Laterality: N/A;   I & D EXTREMITY Right 12/20/2018   Procedure: RIGHT FOOT DEBRIDEMENT;  Surgeon: Nadara Mustard, MD;  Location: [facility] OR;  Service: Orthopedics;  Laterality: Right;   WISDOM TOOTH EXTRACTION      There were no vitals filed for this visit.  Objective measurements completed on examination: See above findings.   No latex allergies     Extension preference   Therapeutic exercise  08/27/21   NEURAL GLIDES:  Seated flossing techniques: 2x12/LE. Good understanding from previous session   Glut bridges:  3x8, 1-2 sec holds at end range.   Glut bridge + blue TB isometric ER: 2x6   SKTC: 4 x 30 sec holds. Reports improvement in LLE symptoms.    Figure 4 stretch supine: Significant relief RLE and limited relief on LLE. 4x 30 sec holds  Heel raises bilat: x12. Reports LLE radicular symptoms sciatic distribution.   L calf stretch in standing. 3x30 sec   Response to  treatment Pt tolerated session well with minor progression of symptoms with heel raises   Clinical impression Pt having excellent carryover in centralization of symptoms from prior session from mid calf to superior, posterior aspect of thigh. Improvement in pain from 6-7/10 pain to 4/10 NPS in back and LLE. Tolerating gentle hip and Lower leg strengthening well with minor reproduction of LLE radicular symptoms with LLE with heel raises. Encouraged to perform gastroc stretching and continue sciatic nerve flossing due to neural tension. Pt will continue to benefit from skilled PT services to improve pain and function to return to pain free ADL completion.   Updated 08/25/21 Access Code: [email or web address] URL: [email or web address] Date: 08/25/2021 Prepared by: Ronnie Derby  Exercises - Hooklying Single Knee to Chest  - 1 x daily - 7 x weekly - 2 sets - 3 reps - 1 min hold - Supine Piriformis Stretch with Foot on Ground  - 1 x daily - 7 x weekly - 2 sets - 3 reps - 1 min hold - Prone Press Up On Elbows  - 1 x daily - 7 x weekly - 3 sets - 20 reps    Access Code: [email or web address] URL: [email or web address] Date: 08/11/2021 Prepared by: Loralyn Freshwater  Exercises - Seated Piriformis Stretch  - 3 x daily - 7 x weekly - 1 sets -  3 reps - 30 seconds hold - Static Prone on Elbows  - 2 x daily - 7 x weekly - 3 sets - 2 reps - 2 minutes hold    PT Short Term Goals - 08/11/21 1831       PT SHORT TERM GOAL #1   Title Patient will be independent with her initial HEP to improve strength, function, and decrease pain.    Baseline Pt has started her initial HEP (08/11/2021)    Time 3    Period Weeks    Status New    Target Date 09/03/21               PT Long Term Goals - 08/11/21 1832       PT LONG TERM GOAL #1   Title Pt will have a decrease in low back pain to 4/10 or less at worst to promote ability to perform activities which involve bending over, as well as prolonged  standing more comfortably.    Baseline 11/10 low back pain at worst for the past 3 months (08/11/2021)    Time 8    Period Weeks    Status New    Target Date 10/08/21      PT LONG TERM GOAL #2   Title Patient will have a decrease in B LE pain to 4/10 or less at worst to promote ability to perform activities which involve bending over as well as prolonged standing as well as tolerate side lying positions more comfortably.    Baseline 11/10 B LE pain at worst for the past 3 months (8/1/20223)    Time 8    Period Weeks    Status New    Target Date 10/08/21      PT LONG TERM GOAL #3   Title Patient will improve bilateral hip extension and abduction strength by at least 1/2 MMT grade to promote ability to perform standing tasks more comfortably.    Baseline Hip extension 3/5 R, 3+/5 L, hip abduction 4/5 R, 4-/5 L (08/11/2021)    Time 8    Period Weeks    Status New    Target Date 10/08/21      PT LONG TERM GOAL #4   Title Patient will improve her lumbar spine FOTO score by at least 10 points as a demonstration of improved function.    Baseline Lumbar spine FOTO 49 (08/11/2021)    Time 8    Period Weeks    Status New    Target Date 10/08/21                      Patient will benefit from skilled therapeutic intervention in order to improve the following deficits and impairments:     Visit Diagnosis: Other low back pain  Sciatica, left side  Sciatica, right side  Muscle weakness (generalized)     Problem List Patient Active Problem List   Diagnosis Date Noted   Herniated intervertebral disc of lumbar spine 08/23/2021   Inadequate pain control 04/02/2021   Anxiety 04/02/2021   Engages in vaping 04/02/2021   DUB (dysfunctional uterine bleeding) 11/28/2020   Lower abdominal pain 11/28/2020   Acute non-recurrent maxillary sinusitis 10/21/2020   Cutaneous abscess of right foot    Postpartum depression 10/12/2017   Family history of breast cancer 03/03/2017    Family history of type 2 diabetes mellitus 03/03/2017   Allergic rhinitis 10/01/2014   Low back pain 07/02/2014   PES PLANUS 12/23/2009  Salem Caster. Fairly IV, PT, DPT Physical Therapist- [facility]  08/27/2021, 3:22 PM

## 2021-08-28 ENCOUNTER — Telehealth: Payer: Self-pay

## 2021-08-28 NOTE — Telephone Encounter (Signed)
Patient sent msg about her FMLA form.  A date or multiple dates need to be changed on the form.  Per Dr Ermalene Searing, I called patient to check which date needs to be changed.  [employer] was not specific as to which date needed to be changed.  I requested that the patient contact [employer] to ask exactly which section and letter needs to be changed so that we do not change the wrong section and letter.  She will contact them and send a mychart msg with the information.

## 2021-09-01 ENCOUNTER — Ambulatory Visit: Payer: Managed Care, Other (non HMO)

## 2021-09-01 DIAGNOSIS — M5431 Sciatica, right side: Secondary | ICD-10-CM

## 2021-09-01 DIAGNOSIS — M6281 Muscle weakness (generalized): Secondary | ICD-10-CM | POA: Diagnosis not present

## 2021-09-01 DIAGNOSIS — M5126 Other intervertebral disc displacement, lumbar region: Secondary | ICD-10-CM | POA: Diagnosis not present

## 2021-09-01 DIAGNOSIS — M5459 Other low back pain: Secondary | ICD-10-CM | POA: Diagnosis not present

## 2021-09-01 DIAGNOSIS — M5432 Sciatica, left side: Secondary | ICD-10-CM | POA: Diagnosis not present

## 2021-09-01 NOTE — Therapy (Signed)
OUTPATIENT PHYSICAL THERAPY TREATMENT NOTE   Patient Name: Amy Cabrera MRN: [id number] DOB:December 13, 1995, 26 y.o., female Today's Date: 09/01/2021  PCP: Excell Seltzer, MD REFERRING PROVIDER: Excell Seltzer, MD   PT End of Session - 09/01/21 1506     Visit Number 4    Number of Visits 17    Date for PT Re-Evaluation 10/08/21    PT Start Time 1506    PT Stop Time 1545    PT Time Calculation (min) 39 min    Activity Tolerance Patient tolerated treatment well    Behavior During Therapy [facility] for tasks assessed/performed             Past Medical History:  Diagnosis Date   Anxiety    Back pain    Medical history non-contributory    Post partum depression    post partum   Past Surgical History:  Procedure Laterality Date   CESAREAN SECTION N/A 08/13/2017   Procedure: CESAREAN SECTION;  Surgeon: Hildred Laser, MD;  Location: [facility] ORS;  Service: Obstetrics;  Laterality: N/A;   I & D EXTREMITY Right 12/20/2018   Procedure: RIGHT FOOT DEBRIDEMENT;  Surgeon: Nadara Mustard, MD;  Location: [facility] OR;  Service: Orthopedics;  Laterality: Right;   WISDOM TOOTH EXTRACTION     Patient Active Problem List   Diagnosis Date Noted   Herniated intervertebral disc of lumbar spine 08/23/2021   Inadequate pain control 04/02/2021   Anxiety 04/02/2021   Engages in vaping 04/02/2021   DUB (dysfunctional uterine bleeding) 11/28/2020   Lower abdominal pain 11/28/2020   Acute non-recurrent maxillary sinusitis 10/21/2020   Cutaneous abscess of right foot    Postpartum depression 10/12/2017   Family history of breast cancer 03/03/2017   Family history of type 2 diabetes mellitus 03/03/2017   Allergic rhinitis 10/01/2014   Low back pain 07/02/2014   PES PLANUS 12/23/2009    REFERRING DIAG: M51.26 (ICD-10-CM) - Herniated intervertebral disc of lumbar spine  THERAPY DIAG:  Other low back pain  Sciatica, left side  Sciatica, right side  Muscle weakness (generalized)  Rationale for  Evaluation and Treatment Rehabilitation  PERTINENT HISTORY: Herniated intervertebral disc of lumbar spine. Pain is located in central low back. Has 2 bulging disc. Has been having back problems since she was 26 years old. Has been doing pretty good for the past 2 years. However on April 02, 2021 pt was squatting down to pick up a light box at work and pt felt her low back pain. Feels B sciatic symptoms (R LE goes to calf, L LE goes to toes laterally/L5 dermatome). Pt works at [employer] and reports. Denies loss of bowel or bladder function. Denies loss of bowel or bladder function or saddle anesthesia  PRECAUTIONS: No known precautions  SUBJECTIVE: 3/10 low back pain currently. L posterior mid thigh pain, 3/10 currently.   PAIN:  Are you having pain?      TODAY'S TREATMENT:  Objective measurements completed on examination: See above findings.    No latex allergies         Extension preference     Therapeutic exercise  09/01/21   Supine L piriformis stretch, L knee to R shoulder 30 seconds, then 1 minute x 2.   No low back and decreased L posterior thigh symptoms afterwards   Glut bridges: 10x5 seconds for 2 sets  Prone press-ups 5x5 seconds for 2 sets  Planks. Increased low back discomfort.    Seated LE neural flossing  L 10x3  R 10x3   Decreased trunk strength observed.   Decreased L posterior thigh symptoms  Seated hip adduction folded pillow squeeze isometrics 10x5 seconds for 2 sets  No L posterior hip or thigh pain afterwards.   Standing B gastroc stretch on first stair step with B UE assist 30 seconds x 3  Sitting in upright posture  Manually resisted trunk flexion isometrics in neutral 10x5 seconds for 2 sets      Response to treatment Pt tolerated session well without aggravation of symptoms. No pain reported after session.     Clinical impression   Decreasing back and LE symptoms based on subjective reports. Continued working on  decreasing L piriformis muscle tension, improving B glute and trunk strength to decrease stress to affected areas. No low back and L posterior thigh symptoms after session. Pt will benefit from continued skilled physical therapy services to decrease pain, improve strength and function.        PATIENT EDUCATION: Education details: there-ex Person educated: Patient Education method: Explanation, Demonstration, Tactile cues, and Verbal cues Education comprehension: verbalized understanding and returned demonstration   HOME EXERCISE PROGRAM: Updated 09/01/21 Access Code: [email or web address] URL: [email or web address] Date: 08/25/2021 Prepared by: Ronnie Derby   Exercises - Hooklying Single Knee to Chest  - 1 x daily - 7 x weekly - 2 sets - 3 reps - 1 min hold - Supine Piriformis Stretch with Foot on Ground  - 1 x daily - 7 x weekly - 2 sets - 3 reps - 1 min hold - Prone Press Up On Elbows  - 1 x daily - 7 x weekly - 3 sets - 20 reps  - Seated Hip Adduction Isometrics with Ball  - 1 x daily - 7 x weekly - 3 sets - 10 reps - 5 seconds hold     Access Code: [email or web address] URL: [email or web address] Date: 08/11/2021 Prepared by: Loralyn Freshwater   Exercises - Seated Piriformis Stretch  - 3 x daily - 7 x weekly - 1 sets - 3 reps - 30 seconds hold - Static Prone on Elbows  - 2 x daily - 7 x weekly - 3 sets - 2 reps - 2 minutes hold     PT Short Term Goals - 08/11/21 1831       PT SHORT TERM GOAL #1   Title Patient will be independent with her initial HEP to improve strength, function, and decrease pain.    Baseline Pt has started her initial HEP (08/11/2021)    Time 3    Period Weeks    Status New    Target Date 09/03/21              PT Long Term Goals - 08/11/21 1832       PT LONG TERM GOAL #1   Title Pt will have a decrease in low back pain to 4/10 or less at worst to promote ability to perform activities which involve bending over, as well as prolonged standing  more comfortably.    Baseline 11/10 low back pain at worst for the past 3 months (08/11/2021)    Time 8    Period Weeks    Status New    Target Date 10/08/21      PT LONG TERM GOAL #2   Title Patient will have a decrease in B LE pain to 4/10 or less at worst to promote ability to perform activities which involve bending over as well as prolonged standing as well as  tolerate side lying positions more comfortably.    Baseline 11/10 B LE pain at worst for the past 3 months (8/1/20223)    Time 8    Period Weeks    Status New    Target Date 10/08/21      PT LONG TERM GOAL #3   Title Patient will improve bilateral hip extension and abduction strength by at least 1/2 MMT grade to promote ability to perform standing tasks more comfortably.    Baseline Hip extension 3/5 R, 3+/5 L, hip abduction 4/5 R, 4-/5 L (08/11/2021)    Time 8    Period Weeks    Status New    Target Date 10/08/21      PT LONG TERM GOAL #4   Title Patient will improve her lumbar spine FOTO score by at least 10 points as a demonstration of improved function.    Baseline Lumbar spine FOTO 49 (08/11/2021)    Time 8    Period Weeks    Status New    Target Date 10/08/21              Plan - 09/01/21 1639     Clinical Impression Statement Decreasing back and LE symptoms based on subjective reports. Continued working on decreasing L piriformis muscle tension, improving B glute and trunk strength to decrease stress to affected areas. No low back and L posterior thigh symptoms after session. Pt will benefit from continued skilled physical therapy services to decrease pain, improve strength and function.    Personal Factors and Comorbidities Comorbidity 3+;Time since onset of injury/illness/exacerbation;Fitness    Comorbidities Anxiety, post partum depression, hx of cesarean section    Examination-Activity Limitations Stand;Bend;Lift    Stability/Clinical Decision Making Stable/Uncomplicated   Pain seems to be worsening based on  subjective reports: 11/10 back pain at most for the past 3 months which is after recent onset April 02, 2021.   Clinical Decision Making Low    Rehab Potential Fair    PT Frequency 2x / week    PT Duration 8 weeks    PT Treatment/Interventions Therapeutic exercise;Therapeutic activities;Neuromuscular re-education;Patient/family education;Manual techniques;Dry needling;Spinal Manipulations;Joint Manipulations;Aquatic Therapy;Electrical Stimulation;Iontophoresis 4mg /ml Dexamethasone;Traction    PT Next Visit Plan Posture, lumbar extension, trunk and glute strengthening, lumbopelvic control, manual techniques, modalities PRN    PT Home Exercise Plan Medbridge Access Code: [email or web address]    Consulted and Agree with Plan of Care Patient             Joneen Boers PT, DPT  09/01/2021, 4:41 PM

## 2021-09-01 NOTE — Telephone Encounter (Signed)
Noted.  Will keep eye out for her MyChart message.

## 2021-09-03 NOTE — Telephone Encounter (Signed)
I called patient to check if she was able to get in touch with [employer] to see which dates need to be changed on her form.  She said she has not been able to get in touch with them.  She is planning on just going back to work and hoping that her back does not bother her again.  I told her if she changes her mind and we need to change any of the dates on her form to please let us know.  She said she will.

## 2021-09-08 ENCOUNTER — Ambulatory Visit: Payer: Managed Care, Other (non HMO)

## 2021-09-08 DIAGNOSIS — M6281 Muscle weakness (generalized): Secondary | ICD-10-CM | POA: Diagnosis not present

## 2021-09-08 DIAGNOSIS — M5431 Sciatica, right side: Secondary | ICD-10-CM

## 2021-09-08 DIAGNOSIS — M5459 Other low back pain: Secondary | ICD-10-CM | POA: Diagnosis not present

## 2021-09-08 DIAGNOSIS — M5432 Sciatica, left side: Secondary | ICD-10-CM | POA: Diagnosis not present

## 2021-09-08 DIAGNOSIS — M5126 Other intervertebral disc displacement, lumbar region: Secondary | ICD-10-CM | POA: Diagnosis not present

## 2021-09-08 NOTE — Therapy (Signed)
OUTPATIENT PHYSICAL THERAPY TREATMENT NOTE   Patient Name: Amy Cabrera MRN: [id number] DOB:01-08-1996, 26 y.o., female Today's Date: 09/08/2021  PCP: Jinny Sanders, MD REFERRING PROVIDER: Jinny Sanders, MD   PT End of Session - 09/08/21 1420     Visit Number 5    Number of Visits 17    Date for PT Re-Evaluation 10/08/21    PT Start Time [id number]    PT Stop Time 1500    PT Time Calculation (min) 40 min    Activity Tolerance Patient tolerated treatment well    Behavior During Therapy WFL for tasks assessed/performed              Past Medical History:  Diagnosis Date   Anxiety    Back pain    Medical history non-contributory    Post partum depression    post partum   Past Surgical History:  Procedure Laterality Date   CESAREAN SECTION N/A 08/13/2017   Procedure: CESAREAN SECTION;  Surgeon: Rubie Maid, MD;  Location: [facility];  Service: Obstetrics;  Laterality: N/A;   I & D EXTREMITY Right 12/20/2018   Procedure: RIGHT FOOT DEBRIDEMENT;  Surgeon: Newt Minion, MD;  Location: [place];  Service: Orthopedics;  Laterality: Right;   WISDOM TOOTH EXTRACTION     Patient Active Problem List   Diagnosis Date Noted   Herniated intervertebral disc of lumbar spine 08/23/2021   Inadequate pain control 04/02/2021   Anxiety 04/02/2021   Engages in vaping 04/02/2021   DUB (dysfunctional uterine bleeding) 11/28/2020   Lower abdominal pain 11/28/2020   Acute non-recurrent maxillary sinusitis 10/21/2020   Cutaneous abscess of right foot    Postpartum depression 10/12/2017   Family history of breast cancer 03/03/2017   Family history of type 2 diabetes mellitus 03/03/2017   Allergic rhinitis 10/01/2014   Low back pain 07/02/2014   PES PLANUS 12/23/2009    REFERRING DIAG: M51.26 (ICD-10-CM) - Herniated intervertebral disc of lumbar spine  THERAPY DIAG:  Other low back pain  Sciatica, left side  Sciatica, right side  Muscle weakness (generalized)  Rationale for  Evaluation and Treatment Rehabilitation  PERTINENT HISTORY: Herniated intervertebral disc of lumbar spine. Pain is located in central low back. Has 2 bulging disc. Has been having back problems since she was 26 years old. Has been doing pretty good for the past 2 years. However on April 02, 2021 pt was squatting down to pick up a light box at work and pt felt her low back pain. Feels B sciatic symptoms (R LE goes to calf, L LE goes to toes laterally/L5 dermatome). Pt works at [employer] and reports. Denies loss of bowel or bladder function. Denies loss of bowel or bladder function or saddle anesthesia  PRECAUTIONS: No known precautions  SUBJECTIVE: Does not hurt as long as she is not moving. More of B posterior lateral hip pain. The pain in back of both thighs are better.   PAIN:  Are you having pain? 2/10 currently B hips.      TODAY'S TREATMENT:  Objective measurements completed on examination: See above findings.    No latex allergies         Extension preference     Therapeutic exercise  09/08/21   Seated LE neural flossing   L 10x3  R 10x3  Bent over hip extension   L 10x3  R 10x3  Standing B gastroc stretch on first stair step with B UE assist 30 seconds x 3  Seated hip hinging 10x   Glut bridges: 10x5 seconds for 3 sets  Prone glute extension   R 5x3  L 5x3  Quadruped hip extension alternating  R 10x3  L 10x3  Sitting on lacrosse ball L posterior hip to decrease L piriformis muscle tension. 1 minute.  Decreased L posterior hip symptoms.         Response to treatment Pt tolerated session well without aggravation of symptoms.       Clinical impression  Continued working on decreasing L piriformis muscle tension, improving B glute and trunk strength to decrease stress to affected areas. Pt tolerated session well without aggravation of symptoms. Pt will benefit from continued skilled physical therapy services to decrease pain,  improve strength and function.        PATIENT EDUCATION: Education details: there-ex Person educated: Patient Education method: Explanation, Demonstration, Tactile cues, and Verbal cues Education comprehension: verbalized understanding and returned demonstration   HOME EXERCISE PROGRAM: Updated 09/01/21 Access Code: [email or web address] URL: [email or web address] Date: 08/25/2021 Prepared by: Ronnie Derby   Exercises - Hooklying Single Knee to Chest  - 1 x daily - 7 x weekly - 2 sets - 3 reps - 1 min hold - Supine Piriformis Stretch with Foot on Ground  - 1 x daily - 7 x weekly - 2 sets - 3 reps - 1 min hold - Prone Press Up On Elbows  - 1 x daily - 7 x weekly - 3 sets - 20 reps  - Seated Hip Adduction Isometrics with Ball  - 1 x daily - 7 x weekly - 3 sets - 10 reps - 5 seconds hold     Access Code: [email or web address] URL: [email or web address] Date: 08/11/2021 Prepared by: Loralyn Freshwater   Exercises - Seated Piriformis Stretch  - 3 x daily - 7 x weekly - 1 sets - 3 reps - 30 seconds hold - Static Prone on Elbows  - 2 x daily - 7 x weekly - 3 sets - 2 reps - 2 minutes hold     PT Short Term Goals - 08/11/21 1831       PT SHORT TERM GOAL #1   Title Patient will be independent with her initial HEP to improve strength, function, and decrease pain.    Baseline Pt has started her initial HEP (08/11/2021)    Time 3    Period Weeks    Status New    Target Date 09/03/21              PT Long Term Goals - 08/11/21 1832       PT LONG TERM GOAL #1   Title Pt will have a decrease in low back pain to 4/10 or less at worst to promote ability to perform activities which involve bending over, as well as prolonged standing more comfortably.    Baseline 11/10 low back pain at worst for the past 3 months (08/11/2021)    Time 8    Period Weeks    Status New    Target Date 10/08/21      PT LONG TERM GOAL #2   Title Patient will have a decrease in B LE pain to 4/10 or less  at worst to promote ability to perform activities which involve bending over as well as prolonged standing as well as tolerate side lying positions more comfortably.    Baseline 11/10 B LE pain at worst for the past 3 months (8/1/20223)    Time 8    Period  Weeks    Status New    Target Date 10/08/21      PT LONG TERM GOAL #3   Title Patient will improve bilateral hip extension and abduction strength by at least 1/2 MMT grade to promote ability to perform standing tasks more comfortably.    Baseline Hip extension 3/5 R, 3+/5 L, hip abduction 4/5 R, 4-/5 L (08/11/2021)    Time 8    Period Weeks    Status New    Target Date 10/08/21      PT LONG TERM GOAL #4   Title Patient will improve her lumbar spine FOTO score by at least 10 points as a demonstration of improved function.    Baseline Lumbar spine FOTO 49 (08/11/2021)    Time 8    Period Weeks    Status New    Target Date 10/08/21              Plan - 09/08/21 1420     Clinical Impression Statement Continued working on decreasing L piriformis muscle tension, improving B glute and trunk strength to decrease stress to affected areas. Pt tolerated session well without aggravation of symptoms. Pt will benefit from continued skilled physical therapy services to decrease pain, improve strength and function.    Personal Factors and Comorbidities Comorbidity 3+;Time since onset of injury/illness/exacerbation;Fitness    Comorbidities Anxiety, post partum depression, hx of cesarean section    Examination-Activity Limitations Stand;Bend;Lift    Stability/Clinical Decision Making Stable/Uncomplicated   Pain seems to be worsening based on subjective reports: 11/10 back pain at most for the past 3 months which is after recent onset April 02, 2021.   Rehab Potential Fair    PT Frequency 2x / week    PT Duration 8 weeks    PT Treatment/Interventions Therapeutic exercise;Therapeutic activities;Neuromuscular re-education;Patient/family  education;Manual techniques;Dry needling;Spinal Manipulations;Joint Manipulations;Aquatic Therapy;Electrical Stimulation;Iontophoresis 4mg /ml Dexamethasone;Traction    PT Next Visit Plan Posture, lumbar extension, trunk and glute strengthening, lumbopelvic control, manual techniques, modalities PRN    PT Home Exercise Plan Medbridge Access Code: [email or web address]    Consulted and Agree with Plan of Care Patient              PT, DPT  09/08/2021, 4:04 PM

## 2021-09-11 DIAGNOSIS — F331 Major depressive disorder, recurrent, moderate: Secondary | ICD-10-CM | POA: Diagnosis not present

## 2021-09-11 DIAGNOSIS — F411 Generalized anxiety disorder: Secondary | ICD-10-CM | POA: Diagnosis not present

## 2021-09-16 ENCOUNTER — Ambulatory Visit: Payer: Managed Care, Other (non HMO)

## 2021-09-21 ENCOUNTER — Ambulatory Visit: Payer: Managed Care, Other (non HMO) | Attending: Family Medicine

## 2021-09-21 ENCOUNTER — Telehealth: Payer: Self-pay

## 2021-09-21 NOTE — Telephone Encounter (Signed)
No show. Called patient who said that she forgot. Will be able to make it to her next session. Doing better.

## 2021-09-23 ENCOUNTER — Encounter: Payer: Self-pay | Admitting: Family Medicine

## 2021-09-23 ENCOUNTER — Ambulatory Visit: Payer: Managed Care, Other (non HMO)

## 2021-09-28 ENCOUNTER — Ambulatory Visit: Payer: Managed Care, Other (non HMO)

## 2021-09-28 ENCOUNTER — Telehealth: Payer: Self-pay

## 2021-09-28 NOTE — Telephone Encounter (Signed)
No show. Called patient pertaining to her appointment. Also informed pt that due to the multiple no shows and cancellations, we might have to remove her from the schedule due to the cancellation/no show policy. Return phone call requested. Phone number [phone]) provided.

## 2021-10-01 ENCOUNTER — Ambulatory Visit: Payer: Managed Care, Other (non HMO)

## 2021-10-05 ENCOUNTER — Ambulatory Visit: Payer: Managed Care, Other (non HMO)

## 2021-10-06 ENCOUNTER — Telehealth: Payer: Managed Care, Other (non HMO)

## 2021-10-06 ENCOUNTER — Telehealth: Payer: Managed Care, Other (non HMO) | Admitting: Physician Assistant

## 2021-10-06 DIAGNOSIS — J019 Acute sinusitis, unspecified: Secondary | ICD-10-CM

## 2021-10-06 DIAGNOSIS — B9689 Other specified bacterial agents as the cause of diseases classified elsewhere: Secondary | ICD-10-CM

## 2021-10-06 MED ORDER — AMOXICILLIN-POT CLAVULANATE 875-125 MG PO TABS: 1.0000 | ORAL_TABLET | Freq: Two times a day (BID) | ORAL | 0 refills | Status: DC

## 2021-10-06 NOTE — Progress Notes (Signed)
I have spent 5 minutes in review of e-visit questionnaire, review and updating patient chart, medical decision making and response to patient.   Jaculin Rasmus Cody Rosevelt Luu, PA-C    

## 2021-10-06 NOTE — Progress Notes (Signed)

## 2021-10-07 ENCOUNTER — Ambulatory Visit: Payer: Managed Care, Other (non HMO)

## 2021-10-09 DIAGNOSIS — F411 Generalized anxiety disorder: Secondary | ICD-10-CM | POA: Diagnosis not present

## 2021-10-09 DIAGNOSIS — F331 Major depressive disorder, recurrent, moderate: Secondary | ICD-10-CM | POA: Diagnosis not present

## 2021-11-18 ENCOUNTER — Emergency Department
Admission: EM | Admit: 2021-11-18 | Discharge: 2021-11-18 | Disposition: A | Discharge status: Home / Self Care | Payer: Medicaid Other | Attending: Emergency Medicine | Admitting: Emergency Medicine

## 2021-11-18 ENCOUNTER — Encounter: Payer: Self-pay | Admitting: Emergency Medicine

## 2021-11-18 ENCOUNTER — Other Ambulatory Visit: Payer: Self-pay

## 2021-11-18 DIAGNOSIS — R519 Headache, unspecified: Secondary | ICD-10-CM | POA: Diagnosis not present

## 2021-11-18 DIAGNOSIS — Z20822 Contact with and (suspected) exposure to covid-19: Secondary | ICD-10-CM | POA: Insufficient documentation

## 2021-11-18 DIAGNOSIS — R1030 Lower abdominal pain, unspecified: Secondary | ICD-10-CM | POA: Insufficient documentation

## 2021-11-18 DIAGNOSIS — R5383 Other fatigue: Secondary | ICD-10-CM | POA: Insufficient documentation

## 2021-11-18 DIAGNOSIS — R590 Localized enlarged lymph nodes: Secondary | ICD-10-CM | POA: Diagnosis not present

## 2021-11-18 DIAGNOSIS — R0981 Nasal congestion: Secondary | ICD-10-CM | POA: Insufficient documentation

## 2021-11-18 DIAGNOSIS — J019 Acute sinusitis, unspecified: Secondary | ICD-10-CM | POA: Diagnosis not present

## 2021-11-18 LAB — COMPREHENSIVE METABOLIC PANEL
ALT: 35 U/L (ref 0–44)
AST: 30 U/L (ref 15–41)
Albumin: 4.2 g/dL (ref 3.5–5.0)
Alkaline Phosphatase: 91 U/L (ref 38–126)
Anion gap: 7 (ref 5–15)
BUN: 6 mg/dL (ref 6–20)
CO2: 23 mmol/L (ref 22–32)
Calcium: 9 mg/dL (ref 8.9–10.3)
Chloride: 112 mmol/L — ABNORMAL HIGH (ref 98–111)
Creatinine, Ser: 0.65 mg/dL (ref 0.44–1.00)
GFR, Estimated: >60 mL/min (ref >60)
Glucose, Bld: 104 mg/dL — ABNORMAL HIGH (ref 70–99)
Potassium: 3.8 mmol/L (ref 3.5–5.1)
Sodium: 142 mmol/L (ref 135–145)
Total Bilirubin: 0.5 mg/dL (ref 0.3–1.2)
Total Protein: 7.5 g/dL (ref 6.5–8.1)

## 2021-11-18 LAB — URINALYSIS, ROUTINE W REFLEX MICROSCOPIC
Bilirubin Urine: NEGATIVE
Glucose, UA: NEGATIVE mg/dL
Hgb urine dipstick: NEGATIVE
Ketones, ur: NEGATIVE mg/dL
Leukocytes,Ua: NEGATIVE
Nitrite: NEGATIVE
Protein, ur: NEGATIVE mg/dL
Specific Gravity, Urine: 1.014 (ref 1.005–1.030)
pH: 8 (ref 5.0–8.0)

## 2021-11-18 LAB — CBC
HCT: 38.2 % (ref 36.0–46.0)
Hemoglobin: 12.7 g/dL (ref 12.0–15.0)
MCH: 27.1 pg (ref 26.0–34.0)
MCHC: 33.2 g/dL (ref 30.0–36.0)
MCV: 81.6 fL (ref 80.0–100.0)
Platelets: 165 10*3/uL (ref 150–400)
RBC: 4.68 MIL/uL (ref 3.87–5.11)
RDW: 13.4 % (ref 11.5–15.5)
WBC: 4.9 10*3/uL (ref 4.0–10.5)
nRBC: 0 % (ref 0.0–0.2)

## 2021-11-18 LAB — RESP PANEL BY RT-PCR (FLU A&B, COVID) ARPGX2
Influenza A by PCR: NEGATIVE
Influenza B by PCR: NEGATIVE
SARS Coronavirus 2 by RT PCR: NEGATIVE

## 2021-11-18 LAB — HCG, QUANTITATIVE, PREGNANCY: hCG, Beta Chain, Quant, S: <1 m[IU]/mL (ref <5)

## 2021-11-18 LAB — POC URINE PREG, ED: Preg Test, Ur: NEGATIVE

## 2021-11-18 LAB — LIPASE, BLOOD: Lipase: 31 U/L (ref 11–51)

## 2021-11-18 MED ORDER — AZITHROMYCIN 250 MG PO TABS: ORAL_TABLET | ORAL | 0 refills | Status: AC

## 2021-11-18 NOTE — ED Triage Notes (Signed)
Patient to ED for sinus congestion. Patient states she is also having lower abd pain x4 days. Patient states symptoms all started with her period. C/o nausea, denies V/D.

## 2021-11-18 NOTE — ED Provider Notes (Signed)
[facility] Provider Note    Event Date/Time   First MD Initiated Contact with Patient 11/18/21 1116     (approximate)  History   Chief Complaint: Nasal Congestion and Abdominal Pain  HPI  Amy Cabrera is a 26 y.o. female with a past medical history of anxiety presents to the emergency department for sinus congestion as well as lower abdominal discomfort.  According to the patient for the past 3 days she has been experiencing sinus congestion and a slight headache.  Patient denies any known fever denies any cough no shortness of breath or chest pain.  Patient also states for the past week or more she has been experiencing some mild vague lower abdominal discomfort.  Patient denies any urinary symptoms.  No dysuria, no vaginal bleeding or discharge however the patient states her last menstrual cycle recently ended and only lasted a couple days and the 1 before that was 3 weeks late.  Patient is concerned that she could have been pregnant but she is not sure.  Physical Exam   Triage Vital Signs: ED Triage Vitals  Enc Vitals Group     BP 11/18/21 1111 (!) 126/93     Pulse Rate 11/18/21 1111 95     Resp 11/18/21 1111 18     Temp 11/18/21 1111 98.4 F (36.9 C)     Temp Source 11/18/21 1111 Oral     SpO2 11/18/21 1111 98 %     Weight --      Height --      Head Circumference --      Peak Flow --      Pain Score 11/18/21 1112 8     Pain Loc --      Pain Edu? --      Excl. in [place]? --     Most recent vital signs: Vitals:   11/18/21 1111  BP: (!) 126/93  Pulse: 95  Resp: 18  Temp: 98.4 F (36.9 C)  SpO2: 98%    General: Awake, no distress.  CV:  Good peripheral perfusion.  Regular rate and rhythm  Resp:  Normal effort.  Equal breath sounds bilaterally.  Abd:  No distention.  Soft, nontender.  No rebound or guarding. Other:  No pharyngeal erythema tonsillar exudate or hypertrophy noted.  Does have slight cervical lymphadenopathy  bilaterally.  ED Results / Procedures / Treatments   MEDICATIONS ORDERED IN ED: Medications - No data to display   IMPRESSION / MDM / [place] / ED COURSE  I reviewed the triage vital signs and the nursing notes.  Patient's presentation is most consistent with acute presentation with potential threat to life or bodily function.  Patient presents emergency department with multiple complaints, first off she is experiencing sinus congestion over the past 3 days along with a mild headache.  The symptoms are suggestive of possible viral illness or sinusitis.  No significant sinus tenderness.  Lab work has resulted showing a normal CBC with a normal white blood cell count.  We will obtain a COVID/flu test as a precaution.  Given the patient's complaint of 1 to 1.5 weeks of lower abdominal discomfort we will also check labs as well as a urine sample.  Patient is mostly concerned that she had several days of spotting and her last menstrual period was several weeks late.  She states irregular periods are fairly normal for her but she could not rule out the chance of a pregnancy.  We will  obtain a blood beta-hCG as a precaution as well as a urine sample.  Patient agreeable to plan of care.  Overall the patient appears well, largely benign abdomen on my exam.  Reassuring vitals.  Patient's work-up is reassuring.  Normal CBC with a normal white blood cell count.  Reassuring/normal chemistry, normal lipase.  Urinalysis shows no concerning findings with a negative pregnancy test as well.  Overall patient appears well.  FINAL CLINICAL IMPRESSION(S) / ED DIAGNOSES   Sinus congestion Abdominal pain   Note:  This document was prepared using Dragon voice recognition software and may include unintentional dictation errors.   Minna Antis, MD 11/18/21 1311

## 2021-12-17 ENCOUNTER — Ambulatory Visit (INDEPENDENT_AMBULATORY_CARE_PROVIDER_SITE_OTHER): Payer: Medicaid Other | Admitting: Primary Care

## 2021-12-17 ENCOUNTER — Encounter: Payer: Self-pay | Admitting: Primary Care

## 2021-12-17 VITALS — BP 118/68 | HR 92 | Temp 97.2°F | Ht 66.0 in | Wt 274.0 lb

## 2021-12-17 DIAGNOSIS — R202 Paresthesia of skin: Secondary | ICD-10-CM | POA: Diagnosis not present

## 2021-12-17 DIAGNOSIS — H538 Other visual disturbances: Secondary | ICD-10-CM

## 2021-12-17 NOTE — Assessment & Plan Note (Signed)
New onset to right fingers.  Interestingly, also began with blurred vision. Could be carpal syndrome.   Consider MRI brain to rule out multiple sclerosis, especially in light of her blurred vision. Checking labs today including B12, TSH, CBC, A1c.  Reviewed recent labs from 1 month ago from the emergency department.
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
## 2021-12-17 NOTE — Assessment & Plan Note (Addendum)
Interesting case.  Differentials include multiple sclerosis, eye disorder, migraines/cluster headaches.  Less likely diabetes.  Recommended she establish with an eye doctor for a complete eye exam.  Checking labs today including TSH, CBC, A1c. Consider MRI brain to rule out multiple sclerosis versus tumor.  Will consult with PCP.

## 2021-12-17 NOTE — Patient Instructions (Signed)
Stop by the lab prior to leaving today. I will notify you of your results once received.   It was a pleasure meeting you!  

## 2021-12-18 ENCOUNTER — Encounter: Payer: Self-pay | Admitting: *Deleted

## 2021-12-18 ENCOUNTER — Other Ambulatory Visit: Payer: Self-pay | Admitting: Primary Care

## 2021-12-18 DIAGNOSIS — R202 Paresthesia of skin: Secondary | ICD-10-CM

## 2021-12-18 DIAGNOSIS — H538 Other visual disturbances: Secondary | ICD-10-CM

## 2021-12-18 LAB — BASIC METABOLIC PANEL
BUN: 11 mg/dL (ref 6–23)
CO2: 27 mEq/L (ref 19–32)
Calcium: 9.5 mg/dL (ref 8.4–10.5)
Chloride: 106 mEq/L (ref 96–112)
Creatinine, Ser: 0.68 mg/dL (ref 0.40–1.20)
GFR: 119.01 mL/min (ref >60.00)
Glucose, Bld: 87 mg/dL (ref 70–99)
Potassium: 4.3 mEq/L (ref 3.5–5.1)
Sodium: 141 mEq/L (ref 135–145)

## 2021-12-18 LAB — HEMOGLOBIN A1C: Hgb A1c MFr Bld: 5.3 % (ref 4.6–6.5)

## 2021-12-18 LAB — VITAMIN B12: Vitamin B-12: 232 pg/mL (ref 211–911)

## 2021-12-18 LAB — TSH: TSH: 1.61 u[IU]/mL (ref 0.35–5.50)

## 2021-12-21 ENCOUNTER — Ambulatory Visit
Admission: RE | Admit: 2021-12-21 | Discharge: 2021-12-21 | Disposition: A | Discharge status: Home / Self Care | Payer: Medicaid Other | Source: Ambulatory Visit | Attending: Primary Care | Admitting: Primary Care

## 2021-12-21 DIAGNOSIS — H538 Other visual disturbances: Secondary | ICD-10-CM | POA: Insufficient documentation

## 2021-12-21 DIAGNOSIS — R202 Paresthesia of skin: Secondary | ICD-10-CM | POA: Insufficient documentation

## 2021-12-21 DIAGNOSIS — R519 Headache, unspecified: Secondary | ICD-10-CM | POA: Diagnosis not present

## 2021-12-21 MED ORDER — GADOBUTROL 1 MMOL/ML IV SOLN
10.0000 mL | Freq: Once | INTRAVENOUS | Status: AC | PRN
  Administered 2021-12-21: 10 mL via INTRAVENOUS

## 2021-12-22 ENCOUNTER — Telehealth: Payer: Self-pay

## 2021-12-22 ENCOUNTER — Encounter: Payer: Self-pay | Admitting: Emergency Medicine

## 2021-12-22 ENCOUNTER — Emergency Department
Admission: EM | Admit: 2021-12-22 | Discharge: 2021-12-22 | Disposition: A | Discharge status: Home / Self Care | Payer: Medicaid Other | Attending: Emergency Medicine | Admitting: Emergency Medicine

## 2021-12-22 ENCOUNTER — Emergency Department: Payer: Medicaid Other

## 2021-12-22 DIAGNOSIS — R519 Headache, unspecified: Secondary | ICD-10-CM | POA: Diagnosis present

## 2021-12-22 DIAGNOSIS — I671 Cerebral aneurysm, nonruptured: Secondary | ICD-10-CM | POA: Diagnosis not present

## 2021-12-22 DIAGNOSIS — I651 Occlusion and stenosis of basilar artery: Secondary | ICD-10-CM | POA: Diagnosis not present

## 2021-12-22 DIAGNOSIS — I725 Aneurysm of other precerebral arteries: Secondary | ICD-10-CM | POA: Diagnosis not present

## 2021-12-22 LAB — COMPREHENSIVE METABOLIC PANEL
ALT: 20 U/L (ref 0–44)
AST: 23 U/L (ref 15–41)
Albumin: 4.2 g/dL (ref 3.5–5.0)
Alkaline Phosphatase: 100 U/L (ref 38–126)
Anion gap: 6 (ref 5–15)
BUN: 10 mg/dL (ref 6–20)
CO2: 25 mmol/L (ref 22–32)
Calcium: 9.9 mg/dL (ref 8.9–10.3)
Chloride: 109 mmol/L (ref 98–111)
Creatinine, Ser: 0.65 mg/dL (ref 0.44–1.00)
GFR, Estimated: >60 mL/min (ref >60)
Glucose, Bld: 147 mg/dL — ABNORMAL HIGH (ref 70–99)
Potassium: 3.9 mmol/L (ref 3.5–5.1)
Sodium: 140 mmol/L (ref 135–145)
Total Bilirubin: 0.4 mg/dL (ref 0.3–1.2)
Total Protein: 7.5 g/dL (ref 6.5–8.1)

## 2021-12-22 LAB — CBC WITH DIFFERENTIAL/PLATELET
Abs Immature Granulocytes: 0.03 10*3/uL (ref 0.00–0.07)
Basophils Absolute: 0.1 10*3/uL (ref 0.0–0.1)
Basophils Relative: 1 %
Eosinophils Absolute: 0.1 10*3/uL (ref 0.0–0.5)
Eosinophils Relative: 1 %
HCT: 42.5 % (ref 36.0–46.0)
Hemoglobin: 13.5 g/dL (ref 12.0–15.0)
Immature Granulocytes: 0 %
Lymphocytes Relative: 19 %
Lymphs Abs: 1.9 10*3/uL (ref 0.7–4.0)
MCH: 26.6 pg (ref 26.0–34.0)
MCHC: 31.8 g/dL (ref 30.0–36.0)
MCV: 83.8 fL (ref 80.0–100.0)
Monocytes Absolute: 0.6 10*3/uL (ref 0.1–1.0)
Monocytes Relative: 6 %
Neutro Abs: 7.6 10*3/uL (ref 1.7–7.7)
Neutrophils Relative %: 73 %
Platelets: 205 10*3/uL (ref 150–400)
RBC: 5.07 MIL/uL (ref 3.87–5.11)
RDW: 12.8 % (ref 11.5–15.5)
WBC: 10.3 10*3/uL (ref 4.0–10.5)
nRBC: 0 % (ref 0.0–0.2)

## 2021-12-22 LAB — HCG, QUANTITATIVE, PREGNANCY: hCG, Beta Chain, Quant, S: <1 m[IU]/mL (ref <5)

## 2021-12-22 MED ORDER — IOHEXOL 350 MG/ML SOLN
75.0000 mL | Freq: Once | INTRAVENOUS | Status: AC | PRN
  Administered 2021-12-22: 75 mL via INTRAVENOUS

## 2021-12-22 NOTE — ED Provider Notes (Signed)
[facility] Provider Note    Event Date/Time   First MD Initiated Contact with Patient 12/22/21 0132     (approximate)   History   Headache and blurred vision   HPI  Amy Cabrera is a 26 y.o. female who was called to come to the emergency department by radiology for abnormal MRI of the brain.  Patient reports headache and blurry vision x 1 month, worsening.  Seen by her PCP who ordered the outpatient MRI.  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  Patient denies fever, neck pain, cough, chest pain, shortness of breath, abdominal pain, nausea, vomiting or dizziness.  Does not wear prescription glasses or contact lenses.  No family history of cerebral aneurysm.  Denies anticoagulant use.     Past Medical History   Past Medical History:  Diagnosis Date   Anxiety    Back pain    Medical history non-contributory    Post partum depression    post partum     Active Problem List   Patient Active Problem List   Diagnosis Date Noted   Blurred vision, bilateral 12/17/2021   Paresthesias 12/17/2021   Herniated intervertebral disc of lumbar spine 08/23/2021   Inadequate pain control 04/02/2021   Anxiety 04/02/2021   Engages in vaping 04/02/2021   DUB (dysfunctional uterine bleeding) 11/28/2020   Lower abdominal pain 11/28/2020   Acute non-recurrent maxillary sinusitis 10/21/2020   Cutaneous abscess of right foot    Postpartum depression 10/12/2017   Family history of breast cancer 03/03/2017   Family history of type 2 diabetes mellitus 03/03/2017   Allergic rhinitis 10/01/2014   Low back pain 07/02/2014   PES PLANUS 12/23/2009     Past Surgical History   Past Surgical History:  Procedure Laterality Date   CESAREAN SECTION N/A 08/13/2017   Procedure: CESAREAN SECTION;  Surgeon: Hildred Laser, MD;  Location: [facility] ORS;  Service: Obstetrics;  Laterality: N/A;   I & D EXTREMITY Right 12/20/2018   Procedure:  RIGHT FOOT DEBRIDEMENT;  Surgeon: Nadara Mustard, MD;  Location: [facility] OR;  Service: Orthopedics;  Laterality: Right;   WISDOM TOOTH EXTRACTION       Home Medications   Prior to Admission medications   Medication Sig Start Date End Date Taking? Authorizing Provider  hydrOXYzine (VISTARIL) 25 MG capsule Take 1 capsule (25 mg total) by mouth 3 (three) times daily as needed for anxiety. 07/21/20   Ardis Hughs, NP  pregabalin (LYRICA) 50 MG capsule Take 1 capsule (50 mg total) by mouth 2 (two) times daily. 07/21/21   Bedsole, Amy E, MD  sertraline (ZOLOFT) 100 MG tablet Take 100 mg by mouth daily. 10/09/21   [provider]  sertraline (ZOLOFT) 50 MG tablet Take 1 tablet (50 mg total) by mouth daily. 07/21/20 07/21/21  Ardis Hughs, NP     Allergies  Patient has no known allergies.   Family History   Family History  Problem Relation Age of Onset   Cancer Maternal Grandmother        breast CA   Heart disease Maternal Grandfather        CAD   Cancer Maternal Grandfather        colon CA   Diabetes Maternal Grandfather      Physical Exam  Triage Vital Signs: ED Triage Vitals  Enc Vitals Group     BP 12/22/21 0130 122/85     Pulse Rate 12/22/21 0130 (!) 103  Resp 12/22/21 0130 18     Temp 12/22/21 0130 98.3 F (36.8 C)     Temp Source 12/22/21 0130 Oral     SpO2 12/22/21 0130 99 %     Weight 12/22/21 0129 274 lb (124.3 kg)     Height 12/22/21 0129 5\' 5"  (1.651 m)     Head Circumference --      Peak Flow --      Pain Score 12/22/21 0129 4     Pain Loc --      Pain Edu? --      Excl. in GC? --     Updated Vital Signs: BP (!) 91/49   Pulse 74   Temp 97.7 F (36.5 C) (Oral)   Resp 17   Ht 5\' 5"  (1.651 m)   Wt 124.3 kg   LMP 12/10/2021 (Approximate)   SpO2 97%   BMI 45.60 kg/m    General: Awake, no distress.  CV:  Good peripheral perfusion.  Resp:  Normal effort.  Abd:  No distention.  Other:  Alert and oriented x 3.  CN II-XII roughly  intact.  5/5 motor strength and sensation all extremities. MAEx4.    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 Multiplanar CT image reconstructions and MIPs were obtained to evaluate the vascular anatomy. Carotid stenosis measurements (when applicable) are obtained utilizing NASCET criteria, using the distal internal carotid diameter as the denominator. RADIATION DOSE REDUCTION: This exam was performed according to the departmental dose-optimization program which includes automated exposure control, adjustment of the mA and/or kV according to patient size and/or use of iterative reconstruction technique. CONTRAST:  100mL OMNIPAQUE IOHEXOL 350 MG/ML SOLN COMPARISON:  None Available. FINDINGS: CT HEAD FINDINGS Brain: There is mass effect on the ventral brainstem by partially thrombosed basilar aneurysm. Otherwise, the brain is normal. There is no intracranial hemorrhage. Skull: The visualized skull base,  calvarium and extracranial soft tissues are normal. Sinuses/Orbits: No fluid levels or advanced mucosal thickening of the visualized paranasal sinuses. No mastoid or middle ear effusion. The orbits are normal. CTA NECK FINDINGS SKELETON: There is no bony spinal canal stenosis. No lytic or blastic lesion. OTHER NECK: Normal pharynx, larynx and major salivary glands. No cervical lymphadenopathy. Unremarkable thyroid gland. UPPER CHEST: No pneumothorax or pleural effusion. No nodules or masses. AORTIC ARCH: There is no calcific atherosclerosis of the aortic arch. There is no aneurysm, dissection or hemodynamically significant stenosis of the visualized portion of the aorta. Conventional 3 vessel aortic branching pattern. The visualized proximal  subclavian arteries are widely patent. RIGHT CAROTID SYSTEM: Normal without aneurysm, dissection or stenosis. LEFT CAROTID SYSTEM: Normal without aneurysm, dissection or stenosis. VERTEBRAL ARTERIES: Left dominant configuration. Both origins are clearly patent. There is no dissection, occlusion or flow-limiting stenosis to the skull base (V1-V3 segments). CTA HEAD FINDINGS POSTERIOR CIRCULATION: --Vertebral arteries: Normal V4 segments. --Inferior cerebellar arteries: Normal. --Basilar artery: There is a partially thrombosed aneurysm of the basilar artery. The aneurysm sac measures 2.7 x 2.2 x 2.6 cm, with mixed density internal blood products. The patent, contrast-enhancing portion of the aneurysm measures 1.0 x 0.9 cm in greatest axial dimensions. Craniocaudal extent is approximately 2.4 cm. --Superior cerebellar arteries: Normal. --Posterior cerebral arteries (PCA): Normal. Both are partially supplied by posterior communicating arteries. ANTERIOR CIRCULATION: --Intracranial internal carotid arteries: Normal. --Anterior cerebral arteries (ACA): Normal. Both A1 segments are present. Patent anterior communicating artery (a-comm). --Middle cerebral arteries (MCA): Normal. VENOUS  SINUSES: As permitted by contrast timing, patent. ANATOMIC VARIANTS: None Review of the MIP images confirms the above findings. IMPRESSION: 1. 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 Non-thrombosed central portion measures 1.0 x 0.9 x 2.4 cm. 2. Neurosurgical consultation recommended Electronically Signed   By: Deatra Robinson M.D.   On: 12/22/2021 02:17   MR BRAIN W WO CONTRAST  Result Date: 12/22/2021 CLINICAL DATA:  Blurred vision and paresthesia. Worsening headaches. EXAM: MRI HEAD WITHOUT AND WITH CONTRAST TECHNIQUE: Multiplanar, multiecho pulse sequences of the brain and surrounding structures were obtained without and with intravenous contrast. CONTRAST:  33mL GADAVIST GADOBUTROL 1 MMOL/ML IV SOLN COMPARISON:  None Available. FINDINGS: Brain: No acute infarct, mass effect or extra-axial collection. No acute or chronic hemorrhage. Normal white matter signal, parenchymal volume and CSF spaces. Peripherally enhancing structure within the prepontine cistern measuring 2.5 x 2.3 cm. There is an area of irregular central enhancement. Vascular: Partially enhancing abnormality at the tip of the basilar artery measures 2.5 x 2.3 cm and is most consistent with a partially thrombosed aneurysm. There is marked mass effect on the ventral midbrain and pons. Skull and upper cervical spine: Normal calvarium and skull base. Visualized upper cervical spine and soft tissues are normal. Sinuses/Orbits:No paranasal sinus fluid levels or advanced mucosal thickening. No mastoid or middle ear effusion. Normal orbits. IMPRESSION: 1. Partially enhancing abnormality at the tip of the basilar artery measures 2.5 x 2.3 cm and is most consistent with a partially thrombosed aneurysm. There is marked mass effect on the ventral midbrain and pons. Neurosurgical consultation is recommended. 2. No acute intracranial hemorrhage. Attempt to contact on-call provider  initiated at 11:03 p.m. on 12/21/2021. Dr. Yetta Barre paged at 11:13 p.m. and again at 11:57 p.m. No return call was received. I contacted the patient directly at 12:28 a.m. on 12/22/2021 and advised her to return to the ER for further evaluation. Electronically Signed   By: Deatra Robinson M.D.   On: 12/22/2021 00:36     PROCEDURES:  Critical Care performed: No  Procedures   MEDICATIONS ORDERED IN ED: Medications  iohexol (OMNIPAQUE) 350 MG/ML injection 75 mL (75 mLs Intravenous Contrast Given 12/22/21 0152)     IMPRESSION / MDM / ASSESSMENT AND PLAN / ED COURSE  I reviewed the triage vital signs and the nursing notes.                             26 year old female who presents to the  ED at the referral of radiology for abnormal brain MRI; experiencing 1 month history of headaches and blurry vision.  Differential diagnosis includes but is not limited to CVA, ICH, aneurysm, MS, etc.  I have personally reviewed patient's records and see her PCP office visit from 12/17/2021.  Patient's presentation is most consistent with acute presentation with potential threat to life or bodily function.  Dr. Marcell BarlowYarborough from neurosurgery was contacted regarding patient's abnormal brain MRI.  He did suggest CTA to further characterize aneurysm.  He gave patient the option of outpatient clinic follow-up in 1 to 2 days or neurosurgical evaluation this morning.  Patient prefers to stay in the ED overnight for neurosurgical evaluation.   Clinical Course as of 12/22/21 [id number]  Tue Dec 22, 2021  0155 Patient had normal renal function 5 days ago; will proceed with CTA head and neck. [JS]  [id number] Updated patient on CTA head/neck.  She is resting and will remain in the ED overnight awaiting neurosurgical evaluation. [JS]  0701 Care transferred to Dr. Lenard LancePaduchowski at change of shift pending neurosurgery evaluation. [JS]    Clinical Course User Index [JS] Irean HongSung, Kaden Daughdrill J, MD     FINAL CLINICAL IMPRESSION(S) / ED DIAGNOSES    Final diagnoses:  Cerebral aneurysm     Rx / DC Orders   ED Discharge Orders     None        Note:  This document was prepared using Dragon voice recognition software and may include unintentional dictation errors.   Irean HongSung, Kajah Santizo J, MD 12/22/21 [id number]

## 2021-12-22 NOTE — ED Triage Notes (Signed)
Pt presents via POV with complaints of headache and blurred vision for ~1 month which prompted an MRI by her PCP. Pt was called by her provider to informed her that she had an abnormal MRI scan and to follow up in the ED. Denies CP or SOB.

## 2021-12-22 NOTE — Discharge Instructions (Addendum)
Please follow up with neurosurgery.   Return to the emergency department for any significant headache, weakness, numbness or any other symptom personally concerning to yourself.

## 2021-12-22 NOTE — Telephone Encounter (Signed)
Thanks. I responded to her MRI results at 7:17 am. Also sent results to PCP.

## 2021-12-22 NOTE — Telephone Encounter (Signed)
[facility] Night - Client Nonclinical Telephone Record  [facility] Client [facility] Night - Client Client Site [facility] - Night Provider Vernona Rieger - NP Contact Type Call Who Is Calling Physician / Provider / Hospital Call Type Provider Call [facility] Page Now Reason for Call Request to speak to Physician Initial Comment Caller states she needs to page the on call regarding a critical finding. Patient Name Amy Cabrera Patient DOB 1995-10-01 Requesting Provider dr. Chase Picket Physician Number [phone] Facility Name [facility] Room Number n/a Disp. Time Disposition Final User 12/21/2021 11:13:27 PM Send to [facility] Paging Queue Burney Gauze 12/21/2021 11:19:04 PM Paged On Call back to Call Center - PC Benjiman Core 12/21/2021 11:43:28 PM Paged On Call back to Call Center - PC Bing Ree 12/22/2021 12:09:02 AM Paged On Call back to Call Center - PC Benjiman Core 12/22/2021 12:33:21 AM Paged On Call back to Call Center - PC Benjiman Core 12/22/2021 12:55:43 AM Page Completed Yes Jason Nest DoctorName Phone DateTime Result/Outcome Message Type Notes Sanda Linger - MD [phone] 12/21/2021 11:19:04 PM Called On Call Provider - Left Message Doctor Paged This message is from [place] in the Call Center. Please call us back at [phone] regarding a page. Sanda Linger - MD [phone] 12/21/2021 11:43:28 PM Paged On Call Back to Call Center Doctor Paged This is Carollee Herter with the call center. Please give [place] a call at [phone]. Thank you. Sanda Linger - MD [phone] 12/22/2021 12:09:02 AM Called On Call Provider - Left Message Doctor Paged Sanda Linger - MD [phone] 12/22/2021 12:33:21 AM Called On Call Provider - Left Message Doctor Paged Sanda Linger - MD 12/22/2021 12:55:38 AM [employer] Msg Not Received - Max Attempts Message Result Call  Closed By: Benjiman Core PLEASE NOTE: All timestamps contained within this report are represented as [place] Standard Time. CONFIDENTIALTY NOTICE: This fax transmission is intended only for the addressee. It contains information that is legally privileged, confidential or otherwise protected from use or disclosure. If you are not the intended recipient, you are strictly prohibited from reviewing, disclosing, copying using or disseminating any of this information or taking any action in reliance on or regarding this information. If you have received this fax in error, please notify us immediately by telephone so that we can arrange for its return to [place]. Phone: [phone], Toll-Free: [phone], Fax: [phone] Page: 2 of 2 Call Id: [id number] Transaction Date/Time: 12/21/2021 11:10:43 PM (ET

## 2021-12-22 NOTE — ED Notes (Signed)
Hard copy of d/c signature signed by the pt at this time.

## 2021-12-22 NOTE — Consult Note (Signed)
Consult requested by:  Dr. Dolores Frame and Dr. Darnelle Catalan  Consult requested for:  aneurysm  Primary Physician:  Excell Seltzer, MD  History of Present Illness: 12/22/2021 Amy Cabrera is here today with a chief complaint of blurry vision.  She has been having blurry vision and headaches that been worsening and magnitude over the past month.  She has been having blurry vision and headaches that been worsening and magnitude over the past month.  She was sent for an MRI scan which showed a suspected cerebral aneurysm in the vicinity of the basilar artery.  She does not have any symptoms of acute rupture.  She does not have any family history of cerebral aneurysms.  She has had no family members passed away suddenly below the age of 7.  She does not smoke but does use vaping products.  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  I have utilized the care everywhere function in epic to review the outside records available from external health systems.  Review of Systems:  A 10 point review of systems is negative, except for the pertinent positives and negatives detailed in the HPI.  Past Medical History: Past Medical History:  Diagnosis Date   Anxiety    Back pain    Medical history non-contributory    Post partum depression    post partum    Past Surgical History: Past Surgical History:  Procedure Laterality Date   CESAREAN SECTION N/A 08/13/2017   Procedure: CESAREAN SECTION;  Surgeon: Hildred Laser, MD;  Location: [facility] ORS;  Service: Obstetrics;  Laterality: N/A;   I & D EXTREMITY Right 12/20/2018   Procedure: RIGHT FOOT DEBRIDEMENT;  Surgeon: Nadara Mustard, MD;  Location: [facility] OR;  Service: Orthopedics;  Laterality: Right;   WISDOM TOOTH EXTRACTION      Allergies: Allergies as of 12/22/2021   (No Known Allergies)    Medications: No outpatient medications have been marked as taking for the 12/22/21 encounter [facility] Encounter).    Social History: Social  History   Tobacco Use   Smoking status: Every Day    Types: E-cigarettes   Smokeless tobacco: Never  Vaping Use   Vaping Use: Every day   Devices: just tried it   Substance Use Topics   Alcohol use: Yes    Alcohol/week: 0.0 standard drinks of alcohol    Comment: social   Drug use: No    Family Medical History: Family History  Problem Relation Age of Onset   Cancer Maternal Grandmother        breast CA   Heart disease Maternal Grandfather        CAD   Cancer Maternal Grandfather        colon CA   Diabetes Maternal Grandfather     Physical Examination: Vitals:   12/22/21 0130 12/22/21 0434  BP: 122/85 (!) 91/49  Pulse: (!) 103 74  Resp: 18 17  Temp: 98.3 F (36.8 C) 97.7 F (36.5 C)  SpO2: 99% 97%    General: Patient is well developed, well nourished, calm, collected, and in no apparent distress. Attention to examination is appropriate.  Neck:   Supple.  Full range of motion.  Respiratory: Patient is breathing without any difficulty.   NEUROLOGICAL:     Awake, alert, oriented to person, place, and time.  Speech is clear and fluent. Fund of knowledge is appropriate.   Cranial Nerves: Pupils equal round and reactive to light.  Facial tone is symmetric.  Facial sensation is symmetric. Shoulder shrug is symmetric. Tongue protrusion is midline.  There is no pronator drift.  ROM of spine: full.    Strength: Side Biceps Triceps Deltoid Interossei Grip Wrist Ext. Wrist Flex.  R 5 5 5 5 5 5 5   L 5 5 5 5 5 5 5    Side Iliopsoas Quads Hamstring PF DF EHL  R 5 5 5 5 5 5   L 5 5 5 5 5 5    Reflexes are 2+ and symmetric at the biceps, triceps, brachioradialis, patella and achilles.   Hoffman's is absent.   Bilateral upper and lower extremity sensation is intact to light touch.    No evidence of dysmetria noted.  Gait is untested.     Medical Decision Making  Imaging: MRI Brain 12/21/2021 IMPRESSION: 1. Partially enhancing abnormality at the tip of the basilar  artery measures 2.5 x 2.3 cm and is most consistent with a partially thrombosed aneurysm. There is marked mass effect on the ventral midbrain and pons. Neurosurgical consultation is recommended. 2. No acute intracranial hemorrhage.   Attempt to contact on-call provider initiated at 11:03 p.m. on 12/21/2021.   Dr. Ronnald Ramp paged at 11:13 p.m. and again at 11:57 p.m. No return call was received.   I contacted the patient directly at 12:28 a.m. on 12/22/2021 and advised her to return to the ER for further evaluation.     Electronically Signed   By: Ulyses Jarred M.D.  CT Angio Head and Neck 12/22/2021  IMPRESSION: 1. 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 Non-thrombosed central portion measures 1.0 x 0.9 x 2.4 cm. 2. Neurosurgical consultation recommended     Electronically Signed   By: Ulyses Jarred M.D.   On: 12/22/2021 02:17  I have personally reviewed the images and agree with the above interpretation.  Assessment and Plan: Amy Cabrera is a pleasant 26 y.o. 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 It appears that most of the basilar artery is involved from the junction of the vertebral arteries.  - No urgent intervention for now, as there is no evidence of subarachnoid hemorrhage - Will refer to [facility] for consultation in short order - I advised the patient to discontinue using nicotine containing products -We will send her out of the emergency department with a lifting limit of 25 pounds    I have communicated my recommendations to the requesting physician and coordinated care to facilitate these recommendations.     Tamryn Popko K. Izora Ribas MD, [facility] Neurosurgery

## 2021-12-22 NOTE — ED Provider Notes (Signed)
-----------------------------------------   8:23 AM on 12/22/2021 ----------------------------------------- Dr. Myer Haff has seen the patient in the emergency department.  Has spoke to colleagues at [facility] who will be providing ongoing care for the patient.  Patient is safe for discharge home with outpatient follow-up.  She has been instructed to discontinue nicotine use and no heavy lifting.  We will provide a note for the patient.  I provided return precautions for headache weakness numbness, etc.  Patient will be discharged with outpatient follow-up.   Minna Antis, MD 12/22/21 [phone]

## 2021-12-22 NOTE — Telephone Encounter (Addendum)
Please see imaging tab for MR Brain; appears from chart review tab that pt went back to [facility] ED. Sending note to Allayne Gitelman NP, [facility] and will speak with [facility] CMA. Jae Dire is aware and has sent note to Dr Ermalene Searing.   [facility] Night - Client TELEPHONE ADVICE RECORD [facility] Patient Name: Amy Cabrera Gender: Unknown DOB: 02-20-1995 Age: 26 Y 10 M 7 D Return Phone Number: Address: City/ State/ Zip: [place] Client [facility] Night - Client Client Site [facility] - Night Provider Vernona Rieger - NP Contact Type Call Who Is Calling Lab / Radiology Lab Name [facility] Radiology Lab Phone Number [phone] Lab Tech Name Nedra Hai Lab Reference Number - Chief Complaint Lab Result (Critical or Stat) Call Type Lab Send to RN Reason for Call Report lab results Initial Comment 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 Translation No Nurse Assessment Nurse: Carlynn Purl, RN, Teodoro Spray Date/Time (Eastern Time): 12/21/2021 11:52:37 PM Is there an on-call provider listed? ---Yes Please list name of person reporting value (Lab Employee) and a contact number. ---Nedra Hai with [facility] Radiology [phone] Please document the following items: Lab name Lab value (read back to lab to verify) Reference range for lab value Date and time blood was drawn Collect time of birth for bilirubin results ---MRI Head w/ and w/o contrast 12/20/21 2000 No results @ this time want to speak with physician directly Disp. Time Lamount Cohen Time) Disposition Final User 12/21/2021 11:57:12 PM Paged On Call back to [facility] Carlynn Purl, RN, Teodoro Spray 12/22/2021 12:29:54 AM Paged On Call back to [facility], RN, Teodoro Spray 12/22/2021 12:48:10 AM Paged On Call back to [facility], RN, Teodoro Spray 12/22/2021 1:04:56 AM Clinical Call Yes Carlynn Purl, RN, Teodoro Spray Final Disposition 12/22/2021  1:04:56 AM Clinical Call Yes Carlynn Purl, RN, Teodoro Spray Comments User: Truitt Leep, RN Date/Time Lamount Cohen Time): 12/22/2021 12:50:03 AM PLEASE NOTE: All timestamps contained within this report are represented as [place] Standard Time. CONFIDENTIALTY NOTICE: This fax transmission is intended only for the addressee. It contains information that is legally privileged, confidential or otherwise protected from use or disclosure. If you are not the intended recipient, you are strictly prohibited from reviewing, disclosing, copying using or disseminating any of this information or taking any action in reliance on or regarding this information. If you have received this fax in error, please notify us immediately by telephone so that we can arrange for its return to [place]. Phone: [phone], Toll-Free: [phone], Fax: [phone] Page: 2 of 2 Call Id: [id number] Comments Called Nedra Hai to follow uo, he states per there protocol pt was contacted and advised to got to ER. Still awaiting to hear from OCP. User: Truitt Leep, RN Date/Time Lamount Cohen Time): 12/22/2021 1:04:46 AM Called radiology and advised no response from OCP at this time. Paging DoctorName Phone DateTime Result/ Outcome Message Type Notes Sanda Linger - MD [phone] 12/21/2021 11:57:12 PM Paged On Call Back to Call Center Doctor Paged Hello! My name is [place]. I am a registered nurse with [facility]. I am contacting you on regards to critcal results. May I ask you call me at [phone] please? Thank you ! Sanda Linger - MD [phone] 12/22/2021 12:29:54 AM Called On Call Provider - Left Message Doctor Paged Sanda Linger - MD [phone] 12/22/2021 12:48:10 AM Paged On Call Back to Call Center Doctor Paged Hello! My name is [place]. I am a registered  nurse with [facility]. I am contacting you on regards to critcal results. May I ask you call me at [phone] please? Thank you ! Sanda Linger -  MD 12/22/2021 1:03:01 AM [employer] Msg Not Received - Max Attempts Message Resul

## 2021-12-23 ENCOUNTER — Telehealth: Payer: Self-pay | Admitting: *Deleted

## 2021-12-23 NOTE — Patient Outreach (Signed)
  Care Coordination TOC Note Transition Care Management Follow-up Telephone Call Date of discharge and from where: 12/22/21 from [facility] How have you been since you were released from the hospital? "I'm doing ok" Any questions or concerns? No  Items Reviewed: Did the pt receive and understand the discharge instructions provided? Yes  Medications obtained and verified?  No new medications prescribed Other? No  Any new allergies since your discharge? No  Dietary orders reviewed? No Do you have support at home? Yes   Home Care and Equipment/Supplies: Were home health services ordered? no If so, what is the name of the agency? N/A  Has the agency set up a time to come to the patient's home? not applicable Were any new equipment or medical supplies ordered?  No What is the name of the medical supply agency? N/A Were you able to get the supplies/equipment? not applicable Do you have any questions related to the use of the equipment or supplies? No  Functional Questionnaire: (I = Independent and D = Dependent) ADLs: I  Bathing/Dressing- I  Meal Prep- I  Eating- I  Maintaining continence- I  Transferring/Ambulation- I  Managing Meds- I  Follow up appointments reviewed:  PCP Hospital f/u appt confirmed? Yes  Scheduled to see PCP on 12/24/21 @ 3:40. Specialist Hospital f/u appt confirmed? Yes  Scheduled to see [facility] on 12/24/21 @ 9:30am. Are transportation arrangements needed? No  If their condition worsens, is the pt aware to call PCP or go to the Emergency Dept.? Yes Was the patient provided with contact information for the PCP's office or ED? No Was to pt encouraged to call back with questions or concerns? Yes  RNCM provided patient with Case Management information and offered CM services. Patient declines at this time.  Estanislado Emms RN, BSN [facility]  [employer]

## 2021-12-24 ENCOUNTER — Encounter: Payer: Self-pay | Admitting: Family Medicine

## 2021-12-24 ENCOUNTER — Ambulatory Visit: Payer: Medicaid Other | Admitting: Family Medicine

## 2021-12-24 VITALS — BP 118/60 | HR 101 | Temp 97.8°F | Ht 66.0 in | Wt 272.0 lb

## 2021-12-24 DIAGNOSIS — I671 Cerebral aneurysm, nonruptured: Secondary | ICD-10-CM

## 2021-12-24 NOTE — Patient Instructions (Signed)
Keep appt as planned with neurosurgery for angiogram next week.  Go to ER if any change in symptoms or increasing headache.  I will let you know if I reach neurosurgery to let them know  your concerns for faster evaluation/treatment given life threatening nature of this issue.

## 2021-12-24 NOTE — Progress Notes (Signed)
Patient ID: Amy Cabrera, female    DOB: 07-Sep-1995, 26 y.o.   MRN: [id number]  This visit was conducted in person.  BP 118/60   Pulse (!) 101   Temp 97.8 F (36.6 C) (Temporal)   Ht 5\' 6"  (1.676 m)   Wt 272 lb (123.4 kg)   LMP 12/10/2021 (Approximate)   SpO2 94%   BMI 43.90 kg/m    CC:  Chief Complaint  Patient presents with   Hospitalization Follow-up    Seen on 12/22/21 at [facility] ED, dx cerebral aneurysm.     Subjective:   HPI: Amy Cabrera is a 26 y.o. female presenting on 12/24/2021 for Hospitalization Follow-up (Seen on 12/22/21 at [facility] ED, dx cerebral aneurysm. )  Reviewed ER visit given abnormal MRI results: Dx  2-3 cm cerebral aneurysm. Headache and blurry vision for 1 month  [facility] from neurosurgery was contacted regarding patient's abnormal brain MRI. He did suggest CTA to further characterize aneurysm.    IMPRESSION: 1. 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 Non-thrombosed central portion measures 1.0 x 0.9 x 2.4 cm. 2. Neurosurgical consultation recommended   Scheduled to see [facility] on 12/24/21 @ 9:30am.  Saw neuro surgery today virtual visit.12/26/21 recommended angiogram next Tuesday.  She and her mother are anxious about waiting on further treatment.  BP Readings from Last 3 Encounters:  12/24/21 118/60  12/22/21 (!) 91/49  12/17/21 118/68    Today she reports minimal change in headache, better tahn yesterday. Stable intermittent blurred vision.. no new numbness, weakness.  Relevant past medical, surgical, family and social history reviewed and updated as indicated. Interim medical history since our last visit reviewed. Allergies and medications reviewed and updated. Outpatient Medications Prior to Visit  Medication Sig Dispense Refill   hydrOXYzine (VISTARIL) 25 MG capsule Take 1 capsule (25 mg total) by mouth 3 (three) times daily as needed for  anxiety. 60 capsule 0   pregabalin (LYRICA) 50 MG capsule Take 1 capsule (50 mg total) by mouth 2 (two) times daily. 60 capsule 1   sertraline (ZOLOFT) 100 MG tablet Take 100 mg by mouth daily.     sertraline (ZOLOFT) 50 MG tablet Take 1 tablet (50 mg total) by mouth daily. 30 tablet 0   No facility-administered medications prior to visit.     Per HPI unless specifically indicated in ROS section below Review of Systems  Constitutional:  Negative for fatigue and fever.  HENT:  Negative for congestion.   Eyes:  Negative for pain.  Respiratory:  Negative for cough and shortness of breath.   Cardiovascular:  Negative for chest pain, palpitations and leg swelling.  Gastrointestinal:  Negative for abdominal pain.  Genitourinary:  Negative for dysuria and vaginal bleeding.  Musculoskeletal:  Negative for back pain.  Neurological:  Positive for headaches. Negative for syncope and light-headedness.  Psychiatric/Behavioral:  Negative for dysphoric mood.    Objective:  BP 118/60   Pulse (!) 101   Temp 97.8 F (36.6 C) (Temporal)   Ht 5\' 6"  (1.676 m)   Wt 272 lb (123.4 kg)   LMP 12/10/2021 (Approximate)   SpO2 94%   BMI 43.90 kg/m   Wt Readings from Last 3 Encounters:  12/24/21 272 lb (123.4 kg)  12/22/21 274 lb (124.3 kg)  12/17/21 274 lb (124.3 kg)      Physical Exam Constitutional:      General: She is not in  acute distress.    Appearance: Normal appearance. She is well-developed. She is not ill-appearing or toxic-appearing.  HENT:     Head: Normocephalic.     Right Ear: Hearing, tympanic membrane, ear canal and external ear normal. Tympanic membrane is not erythematous, retracted or bulging.     Left Ear: Hearing, tympanic membrane, ear canal and external ear normal. Tympanic membrane is not erythematous, retracted or bulging.     Nose: No mucosal edema or rhinorrhea.     Right Sinus: No maxillary sinus tenderness or frontal sinus tenderness.     Left Sinus: No maxillary sinus  tenderness or frontal sinus tenderness.     Mouth/Throat:     Pharynx: Uvula midline.  Eyes:     General: Lids are normal. Lids are everted, no foreign bodies appreciated.     Conjunctiva/sclera: Conjunctivae normal.     Pupils: Pupils are equal, round, and reactive to light.  Neck:     Thyroid: No thyroid mass or thyromegaly.     Vascular: No carotid bruit.     Trachea: Trachea normal.  Cardiovascular:     Rate and Rhythm: Normal rate and regular rhythm.     Pulses: Normal pulses.     Heart sounds: Normal heart sounds, S1 normal and S2 normal. No murmur heard.    No friction rub. No gallop.  Pulmonary:     Effort: Pulmonary effort is normal. No tachypnea or respiratory distress.     Breath sounds: Normal breath sounds. No decreased breath sounds, wheezing, rhonchi or rales.  Abdominal:     General: Bowel sounds are normal.     Palpations: Abdomen is soft.     Tenderness: There is no abdominal tenderness.  Musculoskeletal:     Cervical back: Normal range of motion and neck supple.  Skin:    General: Skin is warm and dry.     Findings: No rash.  Neurological:     Mental Status: She is alert and oriented to person, place, and time.     GCS: GCS eye subscore is 4. GCS verbal subscore is 5. GCS motor subscore is 6.     Cranial Nerves: No cranial nerve deficit.     Sensory: No sensory deficit.     Motor: No abnormal muscle tone.     Coordination: Coordination normal.     Gait: Gait normal.     Deep Tendon Reflexes: Reflexes are normal and symmetric.     Comments: Nml cerebellar exam   No papilledema  Psychiatric:        Mood and Affect: Mood is not anxious or depressed.        Speech: Speech normal.        Behavior: Behavior normal. Behavior is cooperative.        Thought Content: Thought content normal.        Cognition and Memory: Memory is not impaired. She does not exhibit impaired recent memory or impaired remote memory.        Judgment: Judgment normal.        Results for orders placed or performed during the hospital encounter of 12/22/21  CBC with Differential  Result Value Ref Range   WBC 10.3 4.0 - 10.5 K/uL   RBC 5.07 3.87 - 5.11 MIL/uL   Hemoglobin 13.5 12.0 - 15.0 g/dL   HCT 05.3 97.6 - 73.4 %   MCV 83.8 80.0 - 100.0 fL   MCH 26.6 26.0 - 34.0 pg   MCHC 31.8 30.0 -  36.0 g/dL   RDW 16.3 84.5 - 36.4 %   Platelets 205 150 - 400 K/uL   nRBC 0.0 0.0 - 0.2 %   Neutrophils Relative % 73 %   Neutro Abs 7.6 1.7 - 7.7 K/uL   Lymphocytes Relative 19 %   Lymphs Abs 1.9 0.7 - 4.0 K/uL   Monocytes Relative 6 %   Monocytes Absolute 0.6 0.1 - 1.0 K/uL   Eosinophils Relative 1 %   Eosinophils Absolute 0.1 0.0 - 0.5 K/uL   Basophils Relative 1 %   Basophils Absolute 0.1 0.0 - 0.1 K/uL   Immature Granulocytes 0 %   Abs Immature Granulocytes 0.03 0.00 - 0.07 K/uL  Comprehensive metabolic panel  Result Value Ref Range   Sodium 140 135 - 145 mmol/L   Potassium 3.9 3.5 - 5.1 mmol/L   Chloride 109 98 - 111 mmol/L   CO2 25 22 - 32 mmol/L   Glucose, Bld 147 (H) 70 - 99 mg/dL   BUN 10 6 - 20 mg/dL   Creatinine, Ser 6.80 0.44 - 1.00 mg/dL   Calcium 9.9 8.9 - 32.1 mg/dL   Total Protein 7.5 6.5 - 8.1 g/dL   Albumin 4.2 3.5 - 5.0 g/dL   AST 23 15 - 41 U/L   ALT 20 0 - 44 U/L   Alkaline Phosphatase 100 38 - 126 U/L   Total Bilirubin 0.4 0.3 - 1.2 mg/dL   GFR, Estimated >22 >48 mL/min   Anion gap 6 5 - 15  hCG, quantitative, pregnancy  Result Value Ref Range   hCG, Beta Chain, Quant, S <1 <5 mIU/mL     COVID 19 screen:  No recent travel or known exposure to COVID19 The patient denies respiratory symptoms of COVID 19 at this time. The importance of social distancing was discussed today.   Assessment and Plan   Cerebral aneurysm Assessment & Plan:   Stable, no neuro changes. Recommended keeping appt as planned with neurosurgery for angiogram next week.  Go to ER if any change in symptoms or increasing headache.      Return if symptoms  worsen or fail to improve.   Kerby Nora, MD

## 2021-12-29 DIAGNOSIS — G450 Vertebro-basilar artery syndrome: Secondary | ICD-10-CM | POA: Diagnosis not present

## 2022-01-06 DIAGNOSIS — F331 Major depressive disorder, recurrent, moderate: Secondary | ICD-10-CM | POA: Diagnosis not present

## 2022-01-06 DIAGNOSIS — F411 Generalized anxiety disorder: Secondary | ICD-10-CM | POA: Diagnosis not present

## 2022-01-07 DIAGNOSIS — M5126 Other intervertebral disc displacement, lumbar region: Secondary | ICD-10-CM | POA: Diagnosis not present

## 2022-01-07 DIAGNOSIS — I671 Cerebral aneurysm, nonruptured: Secondary | ICD-10-CM | POA: Diagnosis not present

## 2022-01-07 DIAGNOSIS — Z833 Family history of diabetes mellitus: Secondary | ICD-10-CM | POA: Diagnosis not present

## 2022-01-19 DIAGNOSIS — I671 Cerebral aneurysm, nonruptured: Secondary | ICD-10-CM | POA: Diagnosis not present

## 2022-01-19 DIAGNOSIS — F419 Anxiety disorder, unspecified: Secondary | ICD-10-CM | POA: Diagnosis not present

## 2022-01-19 DIAGNOSIS — I725 Aneurysm of other precerebral arteries: Secondary | ICD-10-CM | POA: Diagnosis not present

## 2022-01-19 DIAGNOSIS — F32A Depression, unspecified: Secondary | ICD-10-CM | POA: Diagnosis not present

## 2022-01-20 DIAGNOSIS — I725 Aneurysm of other precerebral arteries: Secondary | ICD-10-CM | POA: Diagnosis not present

## 2022-02-01 NOTE — Assessment & Plan Note (Signed)
Stable, no neuro changes. Recommended keeping appt as planned with neurosurgery for angiogram next week.  Go to ER if any change in symptoms or increasing headache.

## 2022-03-03 DIAGNOSIS — I725 Aneurysm of other precerebral arteries: Secondary | ICD-10-CM | POA: Diagnosis not present

## 2022-03-24 DIAGNOSIS — I671 Cerebral aneurysm, nonruptured: Secondary | ICD-10-CM | POA: Diagnosis not present

## 2022-03-24 DIAGNOSIS — I725 Aneurysm of other precerebral arteries: Secondary | ICD-10-CM | POA: Diagnosis not present

## 2022-03-31 DIAGNOSIS — I671 Cerebral aneurysm, nonruptured: Secondary | ICD-10-CM | POA: Diagnosis not present

## 2022-04-28 DIAGNOSIS — R0989 Other specified symptoms and signs involving the circulatory and respiratory systems: Secondary | ICD-10-CM | POA: Diagnosis not present

## 2022-04-28 DIAGNOSIS — J0140 Acute pansinusitis, unspecified: Secondary | ICD-10-CM | POA: Diagnosis not present

## 2022-04-28 DIAGNOSIS — I671 Cerebral aneurysm, nonruptured: Secondary | ICD-10-CM | POA: Diagnosis not present

## 2022-04-28 DIAGNOSIS — J3489 Other specified disorders of nose and nasal sinuses: Secondary | ICD-10-CM | POA: Diagnosis not present

## 2022-05-03 DIAGNOSIS — I725 Aneurysm of other precerebral arteries: Secondary | ICD-10-CM | POA: Diagnosis not present

## 2022-08-30 DIAGNOSIS — I671 Cerebral aneurysm, nonruptured: Secondary | ICD-10-CM | POA: Diagnosis not present

## 2022-09-02 DIAGNOSIS — I671 Cerebral aneurysm, nonruptured: Secondary | ICD-10-CM | POA: Diagnosis not present

## 2022-12-29 DIAGNOSIS — J40 Bronchitis, not specified as acute or chronic: Secondary | ICD-10-CM | POA: Diagnosis not present

## 2023-01-31 DIAGNOSIS — O200 Threatened abortion: Secondary | ICD-10-CM | POA: Diagnosis not present

## 2023-01-31 DIAGNOSIS — O23591 Infection of other part of genital tract in pregnancy, first trimester: Secondary | ICD-10-CM | POA: Diagnosis not present

## 2023-01-31 DIAGNOSIS — O2341 Unspecified infection of urinary tract in pregnancy, first trimester: Secondary | ICD-10-CM | POA: Diagnosis not present

## 2023-01-31 DIAGNOSIS — Z3A00 Weeks of gestation of pregnancy not specified: Secondary | ICD-10-CM | POA: Diagnosis not present

## 2023-01-31 DIAGNOSIS — O99330 Smoking (tobacco) complicating pregnancy, unspecified trimester: Secondary | ICD-10-CM | POA: Diagnosis not present

## 2023-01-31 DIAGNOSIS — Z3A01 Less than 8 weeks gestation of pregnancy: Secondary | ICD-10-CM | POA: Diagnosis not present

## 2023-01-31 DIAGNOSIS — B9689 Other specified bacterial agents as the cause of diseases classified elsewhere: Secondary | ICD-10-CM | POA: Diagnosis not present

## 2023-01-31 DIAGNOSIS — O209 Hemorrhage in early pregnancy, unspecified: Secondary | ICD-10-CM | POA: Diagnosis not present

## 2023-02-01 DIAGNOSIS — O209 Hemorrhage in early pregnancy, unspecified: Secondary | ICD-10-CM | POA: Diagnosis not present

## 2023-02-01 DIAGNOSIS — O2341 Unspecified infection of urinary tract in pregnancy, first trimester: Secondary | ICD-10-CM | POA: Diagnosis not present

## 2023-02-01 DIAGNOSIS — Z3A00 Weeks of gestation of pregnancy not specified: Secondary | ICD-10-CM | POA: Diagnosis not present

## 2023-02-01 DIAGNOSIS — Z3A01 Less than 8 weeks gestation of pregnancy: Secondary | ICD-10-CM | POA: Diagnosis not present

## 2023-02-01 DIAGNOSIS — B9689 Other specified bacterial agents as the cause of diseases classified elsewhere: Secondary | ICD-10-CM | POA: Diagnosis not present

## 2023-02-01 DIAGNOSIS — O23591 Infection of other part of genital tract in pregnancy, first trimester: Secondary | ICD-10-CM | POA: Diagnosis not present

## 2023-02-14 DIAGNOSIS — N912 Amenorrhea, unspecified: Secondary | ICD-10-CM | POA: Diagnosis not present

## 2023-02-14 DIAGNOSIS — O039 Complete or unspecified spontaneous abortion without complication: Secondary | ICD-10-CM | POA: Diagnosis not present

## 2023-02-23 DIAGNOSIS — I671 Cerebral aneurysm, nonruptured: Secondary | ICD-10-CM | POA: Diagnosis not present

## 2023-02-28 DIAGNOSIS — I671 Cerebral aneurysm, nonruptured: Secondary | ICD-10-CM | POA: Diagnosis not present

## 2023-07-03 DIAGNOSIS — J029 Acute pharyngitis, unspecified: Secondary | ICD-10-CM | POA: Diagnosis not present

## 2023-07-03 DIAGNOSIS — Z888 Allergy status to other drugs, medicaments and biological substances status: Secondary | ICD-10-CM | POA: Diagnosis not present

## 2023-07-03 DIAGNOSIS — F17200 Nicotine dependence, unspecified, uncomplicated: Secondary | ICD-10-CM | POA: Diagnosis not present

## 2023-07-05 DIAGNOSIS — L02411 Cutaneous abscess of right axilla: Secondary | ICD-10-CM | POA: Diagnosis not present

## 2023-07-05 DIAGNOSIS — L03111 Cellulitis of right axilla: Secondary | ICD-10-CM | POA: Diagnosis not present

## 2023-07-08 ENCOUNTER — Other Ambulatory Visit: Payer: Self-pay

## 2023-07-08 ENCOUNTER — Emergency Department
Admission: EM | Admit: 2023-07-08 | Discharge: 2023-07-08 | Disposition: A | Discharge status: Home / Self Care | Attending: Emergency Medicine | Admitting: Emergency Medicine

## 2023-07-08 DIAGNOSIS — M79601 Pain in right arm: Secondary | ICD-10-CM | POA: Diagnosis present

## 2023-07-08 DIAGNOSIS — L03113 Cellulitis of right upper limb: Secondary | ICD-10-CM | POA: Insufficient documentation

## 2023-07-08 DIAGNOSIS — Z4801 Encounter for change or removal of surgical wound dressing: Secondary | ICD-10-CM | POA: Diagnosis not present

## 2023-07-08 DIAGNOSIS — Z5189 Encounter for other specified aftercare: Secondary | ICD-10-CM | POA: Diagnosis not present

## 2023-07-08 LAB — POC URINE PREG, ED: Preg Test, Ur: NEGATIVE

## 2023-07-08 MED ORDER — SULFAMETHOXAZOLE-TRIMETHOPRIM 800-160 MG PO TABS: 1.0000 | ORAL_TABLET | Freq: Two times a day (BID) | ORAL | 0 refills | Status: AC

## 2023-07-08 MED ORDER — SULFAMETHOXAZOLE-TRIMETHOPRIM 800-160 MG PO TABS
1.0000 | ORAL_TABLET | Freq: Once | ORAL | Status: AC
  Administered 2023-07-08: 1 via ORAL
  Filled 2023-07-08: qty 1

## 2023-07-08 NOTE — ED Provider Notes (Signed)
 [facility] Provider Note    Event Date/Time   First MD Initiated Contact with Patient 07/08/23 [phone]     (approximate)   History   Wound Check   HPI  Amy Cabrera is a 28 y.o. female   Past medical history of abscess status post drainage 2 days ago at a hospital emergency department in [place]  who presents to the emergency department today with redness tracking down her arm from the area of the abscess, was supposed to be on oral antibiotics but did not pick up or start this medication.  Denies fevers or chills.  She had a packing in place that she states needs to be removed today.    External Medical Documents Reviewed: Past orthopedic surgery notes from 2020 for right foot debridement      Physical Exam   Triage Vital Signs: ED Triage Vitals  Encounter Vitals Group     BP 07/08/23 0359 119/81     Girls Systolic BP Percentile --      Girls Diastolic BP Percentile --      Boys Systolic BP Percentile --      Boys Diastolic BP Percentile --      Pulse Rate 07/08/23 0359 (!) 105     Resp 07/08/23 0359 18     Temp 07/08/23 0359 98.3 F (36.8 C)     Temp Source 07/08/23 0359 Oral     SpO2 07/08/23 0359 99 %     Weight 07/08/23 0404 228 lb (103.4 kg)     Height 07/08/23 0404 5' 6 (1.676 m)     Head Circumference --      Peak Flow --      Pain Score 07/08/23 0404 5     Pain Loc --      Pain Education --      Exclude from Growth Chart --     Most recent vital signs: Vitals:   07/08/23 0359 07/08/23 0422  BP: 119/81   Pulse: (!) 105 84  Resp: 18   Temp: 98.3 F (36.8 C)   SpO2: 99%     General: Awake, no distress.  CV:  Good peripheral perfusion.  Resp:  Normal effort.  Abd:  No distention.  Other:  She has purulent packing in place, scant packing was removed from the right axilla with some scant purulence there, no fluctuance, some cellulitic tracking down the medial side of the right arm, with no fluctuance to  the cellulitic change area, crepitus or pain out of proportion.   ED Results / Procedures / Treatments   Labs (all labs ordered are listed, but only abnormal results are displayed) Labs Reviewed  POC URINE PREG, ED     I ordered and reviewed the above labs they are notable for negative pregnancy test   PROCEDURES:  Critical Care performed: No  Procedures   MEDICATIONS ORDERED IN ED: Medications - No data to display  IMPRESSION / MDM / ASSESSMENT AND PLAN / ED COURSE  I reviewed the triage vital signs and the nursing notes.                                Patient's presentation is most consistent with acute presentation with potential threat to life or bodily function.  Differential diagnosis includes, but is not limited to, abscess, cellulitis, considered but less likely necrotizing fasciitis, or sepsis   The patient is  on the cardiac monitor to evaluate for evidence of arrhythmia and/or significant heart rate changes.  MDM:    Abscess drained 2 days ago with now some cellulitic changes surrounding and tracking down the arm, was supposed to be on antibiotics but did not start due to traveling from [place] to [place] where her permanent home is.  She denies any systemic symptoms like fevers or chills and appears well and nontoxic with only localized infectious signs only, with no evidence of drainable collection at this time, normal vital signs doubt sepsis.  I will give her the first dose of Bactrim  antibiotic and a 7-day course for the same and have her closely monitor her symptoms at home, return with worsening.  She has a PMD in the area who can follow-up with her as well.        FINAL CLINICAL IMPRESSION(S) / ED DIAGNOSES   Final diagnoses:  Visit for wound check  Cellulitis of right upper extremity     Rx / DC Orders   ED Discharge Orders     None        Note:  This document was prepared using Dragon voice recognition software and may include  unintentional dictation errors.    Cyrena Mylar, MD 07/08/23 [phone]

## 2023-07-08 NOTE — ED Triage Notes (Signed)
 Patient C/O redness and warmth near where a cyst was removed under her right armpit on June 25th. Patient does endorse some drainage from the site. Patient was sent home with a prescription for antibiotics but she was not able to fill it. She denies any fever, nausea or vomiting at this time.

## 2023-07-08 NOTE — Discharge Instructions (Signed)
 Please keep your wound clean by washing at least daily with soap and water.  Apply antibiotic ointment daily If you see any signs of infection like spreading redness, pus coming from the wound, extreme pain, fevers, chills or any other worsening doctor right away or come back to the emergency department  Take the full 7-day course of your antibiotics as prescribed.  Take acetaminophen  650 mg and ibuprofen  400 mg every 6 hours for pain.  Take with food.   Thank you for choosing us  for your health care today!  Please see your primary doctor this week for a follow up appointment.   If you have any new, worsening, or unexpected symptoms call your doctor right away or come back to the emergency department for reevaluation.  It was my pleasure to care for you today.   Ginnie EDISON Cyrena, MD

## 2023-12-13 DIAGNOSIS — I671 Cerebral aneurysm, nonruptured: Secondary | ICD-10-CM | POA: Diagnosis not present
